# Patient Record
Sex: Female | Born: 1960 | Race: White | Hispanic: No | Marital: Married | Smoking: Former smoker
Health system: Southern US, Community
[De-identification: ages and names within clinical notes are randomized; demographics above are authoritative.]

## PROBLEM LIST (undated history)

## (undated) DIAGNOSIS — M8580 Other specified disorders of bone density and structure, unspecified site: Secondary | ICD-10-CM

## (undated) DIAGNOSIS — F329 Major depressive disorder, single episode, unspecified: Secondary | ICD-10-CM

## (undated) DIAGNOSIS — G4730 Sleep apnea, unspecified: Secondary | ICD-10-CM

## (undated) DIAGNOSIS — N393 Stress incontinence (female) (male): Secondary | ICD-10-CM

## (undated) DIAGNOSIS — K219 Gastro-esophageal reflux disease without esophagitis: Secondary | ICD-10-CM

## (undated) DIAGNOSIS — E785 Hyperlipidemia, unspecified: Secondary | ICD-10-CM

## (undated) DIAGNOSIS — E079 Disorder of thyroid, unspecified: Secondary | ICD-10-CM

## (undated) DIAGNOSIS — F32A Depression, unspecified: Secondary | ICD-10-CM

## (undated) DIAGNOSIS — M810 Age-related osteoporosis without current pathological fracture: Secondary | ICD-10-CM

## (undated) HISTORY — PX: LAPAROSCOPIC ENDOMETRIOSIS FULGURATION: SUR769

## (undated) HISTORY — DX: Disorder of thyroid, unspecified: E07.9

## (undated) HISTORY — DX: Major depressive disorder, single episode, unspecified: F32.9

## (undated) HISTORY — PX: LAPAROTOMY: SHX154

## (undated) HISTORY — PX: OTHER SURGICAL HISTORY: SHX169

## (undated) HISTORY — DX: Hyperlipidemia, unspecified: E78.5

## (undated) HISTORY — PX: ABDOMINAL HYSTERECTOMY: SHX81

## (undated) HISTORY — PX: THYROIDECTOMY: SHX17

## (undated) HISTORY — DX: Depression, unspecified: F32.A

## (undated) HISTORY — PX: THYROIDECTOMY, PARTIAL: SHX18

## (undated) HISTORY — DX: Gastro-esophageal reflux disease without esophagitis: K21.9

## (undated) HISTORY — DX: Sleep apnea, unspecified: G47.30

## (undated) HISTORY — PX: OOPHORECTOMY: SHX86

## (undated) HISTORY — DX: Other specified disorders of bone density and structure, unspecified site: M85.80

## (undated) HISTORY — DX: Age-related osteoporosis without current pathological fracture: M81.0

---

## 2004-09-27 ENCOUNTER — Other Ambulatory Visit: Admission: RE | Admit: 2004-09-27 | Discharge: 2004-09-27 | Payer: Self-pay | Admitting: Family Medicine

## 2005-01-17 ENCOUNTER — Encounter: Admission: RE | Admit: 2005-01-17 | Discharge: 2005-01-17 | Payer: Self-pay | Admitting: Family Medicine

## 2005-02-22 ENCOUNTER — Encounter (HOSPITAL_COMMUNITY): Admission: RE | Admit: 2005-02-22 | Discharge: 2005-05-23 | Payer: Self-pay | Admitting: Endocrinology

## 2005-04-10 ENCOUNTER — Ambulatory Visit (HOSPITAL_COMMUNITY): Admission: RE | Admit: 2005-04-10 | Discharge: 2005-04-10 | Payer: Self-pay | Admitting: Surgery

## 2005-12-09 ENCOUNTER — Ambulatory Visit (HOSPITAL_COMMUNITY): Admission: RE | Admit: 2005-12-09 | Discharge: 2005-12-10 | Payer: Self-pay | Admitting: Surgery

## 2005-12-09 ENCOUNTER — Encounter (INDEPENDENT_AMBULATORY_CARE_PROVIDER_SITE_OTHER): Payer: Self-pay | Admitting: *Deleted

## 2006-03-04 ENCOUNTER — Encounter: Admission: RE | Admit: 2006-03-04 | Discharge: 2006-03-04 | Payer: Self-pay | Admitting: Surgery

## 2006-04-08 ENCOUNTER — Encounter: Admission: RE | Admit: 2006-04-08 | Discharge: 2006-04-08 | Payer: Self-pay | Admitting: Surgery

## 2006-12-25 ENCOUNTER — Ambulatory Visit (HOSPITAL_COMMUNITY): Admission: RE | Admit: 2006-12-25 | Discharge: 2006-12-25 | Payer: Self-pay | Admitting: Surgery

## 2008-11-04 ENCOUNTER — Ambulatory Visit: Payer: Self-pay | Admitting: Family Medicine

## 2008-11-04 DIAGNOSIS — E213 Hyperparathyroidism, unspecified: Secondary | ICD-10-CM | POA: Insufficient documentation

## 2008-11-04 DIAGNOSIS — F341 Dysthymic disorder: Secondary | ICD-10-CM | POA: Insufficient documentation

## 2008-11-04 DIAGNOSIS — F32A Depression, unspecified: Secondary | ICD-10-CM | POA: Insufficient documentation

## 2008-11-04 DIAGNOSIS — F329 Major depressive disorder, single episode, unspecified: Secondary | ICD-10-CM

## 2008-11-07 ENCOUNTER — Encounter: Admission: RE | Admit: 2008-11-07 | Discharge: 2008-11-07 | Payer: Self-pay | Admitting: Family Medicine

## 2008-11-07 LAB — HM MAMMOGRAPHY: HM Mammogram: NEGATIVE

## 2008-11-10 ENCOUNTER — Telehealth (INDEPENDENT_AMBULATORY_CARE_PROVIDER_SITE_OTHER): Payer: Self-pay | Admitting: *Deleted

## 2008-11-10 LAB — CONVERTED CEMR LAB
ALT: 25 units/L (ref 0–35)
AST: 22 units/L (ref 0–37)
Albumin: 3.8 g/dL (ref 3.5–5.2)
Alkaline Phosphatase: 105 units/L (ref 39–117)
BUN: 11 mg/dL (ref 6–23)
Basophils Absolute: 0 10*3/uL (ref 0.0–0.1)
Basophils Relative: 0.1 % (ref 0.0–3.0)
Bilirubin, Direct: 0.1 mg/dL (ref 0.0–0.3)
CO2: 30 meq/L (ref 19–32)
Calcium: 10.4 mg/dL (ref 8.4–10.5)
Chloride: 108 meq/L (ref 96–112)
Cholesterol: 193 mg/dL (ref 0–200)
Creatinine, Ser: 0.8 mg/dL (ref 0.4–1.2)
Eosinophils Absolute: 0.2 10*3/uL (ref 0.0–0.7)
Eosinophils Relative: 5.6 % — ABNORMAL HIGH (ref 0.0–5.0)
GFR calc non Af Amer: 81.31 mL/min (ref >60)
Glucose, Bld: 78 mg/dL (ref 70–99)
HCT: 39 % (ref 36.0–46.0)
HDL: 41.7 mg/dL (ref >39.00)
Hemoglobin: 13.2 g/dL (ref 12.0–15.0)
LDL Cholesterol: 131 mg/dL — ABNORMAL HIGH (ref 0–99)
Lymphocytes Relative: 20 % (ref 12.0–46.0)
Lymphs Abs: 0.6 10*3/uL — ABNORMAL LOW (ref 0.7–4.0)
MCHC: 33.9 g/dL (ref 30.0–36.0)
MCV: 91.3 fL (ref 78.0–100.0)
Monocytes Absolute: 0.4 10*3/uL (ref 0.1–1.0)
Monocytes Relative: 12.5 % — ABNORMAL HIGH (ref 3.0–12.0)
Neutro Abs: 1.9 10*3/uL (ref 1.4–7.7)
Neutrophils Relative %: 61.8 % (ref 43.0–77.0)
Platelets: 249 10*3/uL (ref 150.0–400.0)
Potassium: 4.9 meq/L (ref 3.5–5.1)
RBC: 4.27 M/uL (ref 3.87–5.11)
RDW: 12 % (ref 11.5–14.6)
Sodium: 145 meq/L (ref 135–145)
TSH: 6.23 microintl units/mL — ABNORMAL HIGH (ref 0.35–5.50)
Total Bilirubin: 0.9 mg/dL (ref 0.3–1.2)
Total CHOL/HDL Ratio: 5
Total Protein: 6.9 g/dL (ref 6.0–8.3)
Triglycerides: 103 mg/dL (ref 0.0–149.0)
VLDL: 20.6 mg/dL (ref 0.0–40.0)
WBC: 3.1 10*3/uL — ABNORMAL LOW (ref 4.5–10.5)

## 2008-11-22 ENCOUNTER — Ambulatory Visit: Payer: Self-pay | Admitting: Family Medicine

## 2008-11-22 DIAGNOSIS — E785 Hyperlipidemia, unspecified: Secondary | ICD-10-CM | POA: Insufficient documentation

## 2008-11-22 DIAGNOSIS — L989 Disorder of the skin and subcutaneous tissue, unspecified: Secondary | ICD-10-CM | POA: Insufficient documentation

## 2008-11-22 DIAGNOSIS — E039 Hypothyroidism, unspecified: Secondary | ICD-10-CM | POA: Insufficient documentation

## 2008-12-23 ENCOUNTER — Encounter (INDEPENDENT_AMBULATORY_CARE_PROVIDER_SITE_OTHER): Payer: Self-pay | Admitting: *Deleted

## 2008-12-23 ENCOUNTER — Ambulatory Visit: Payer: Self-pay | Admitting: Family Medicine

## 2008-12-23 LAB — CONVERTED CEMR LAB: TSH: 2.22 microintl units/mL (ref 0.35–5.50)

## 2009-04-27 ENCOUNTER — Ambulatory Visit: Payer: Self-pay | Admitting: Family Medicine

## 2009-04-27 DIAGNOSIS — J019 Acute sinusitis, unspecified: Secondary | ICD-10-CM | POA: Insufficient documentation

## 2009-06-12 ENCOUNTER — Telehealth (INDEPENDENT_AMBULATORY_CARE_PROVIDER_SITE_OTHER): Payer: Self-pay | Admitting: *Deleted

## 2009-06-14 ENCOUNTER — Telehealth (INDEPENDENT_AMBULATORY_CARE_PROVIDER_SITE_OTHER): Payer: Self-pay | Admitting: *Deleted

## 2009-06-15 ENCOUNTER — Ambulatory Visit: Payer: Self-pay | Admitting: Family Medicine

## 2009-06-19 ENCOUNTER — Telehealth (INDEPENDENT_AMBULATORY_CARE_PROVIDER_SITE_OTHER): Payer: Self-pay | Admitting: *Deleted

## 2009-06-21 LAB — CONVERTED CEMR LAB
Cholesterol: 188 mg/dL (ref 0–200)
HDL: 49.5 mg/dL (ref >39.00)
LDL Cholesterol: 108 mg/dL — ABNORMAL HIGH (ref 0–99)
Total CHOL/HDL Ratio: 4
Triglycerides: 153 mg/dL — ABNORMAL HIGH (ref 0.0–149.0)
VLDL: 30.6 mg/dL (ref 0.0–40.0)

## 2009-06-22 ENCOUNTER — Encounter (INDEPENDENT_AMBULATORY_CARE_PROVIDER_SITE_OTHER): Payer: Self-pay | Admitting: *Deleted

## 2009-07-14 ENCOUNTER — Ambulatory Visit: Payer: Self-pay | Admitting: Family

## 2009-07-14 DIAGNOSIS — H6690 Otitis media, unspecified, unspecified ear: Secondary | ICD-10-CM | POA: Insufficient documentation

## 2009-07-14 LAB — CONVERTED CEMR LAB
Inflenza A Ag: NEGATIVE
Influenza B Ag: NEGATIVE

## 2009-09-11 ENCOUNTER — Telehealth: Payer: Self-pay | Admitting: Family

## 2009-11-02 ENCOUNTER — Telehealth (INDEPENDENT_AMBULATORY_CARE_PROVIDER_SITE_OTHER): Payer: Self-pay | Admitting: *Deleted

## 2009-11-03 ENCOUNTER — Telehealth (INDEPENDENT_AMBULATORY_CARE_PROVIDER_SITE_OTHER): Payer: Self-pay | Admitting: *Deleted

## 2010-03-30 ENCOUNTER — Ambulatory Visit: Payer: Self-pay | Admitting: Family

## 2010-03-30 DIAGNOSIS — S335XXA Sprain of ligaments of lumbar spine, initial encounter: Secondary | ICD-10-CM | POA: Insufficient documentation

## 2010-03-30 DIAGNOSIS — G2581 Restless legs syndrome: Secondary | ICD-10-CM | POA: Insufficient documentation

## 2010-03-30 LAB — CONVERTED CEMR LAB
Bilirubin Urine: NEGATIVE
Blood in Urine, dipstick: NEGATIVE
Glucose, Urine, Semiquant: NEGATIVE
Ketones, urine, test strip: NEGATIVE
Nitrite: NEGATIVE
Protein, U semiquant: NEGATIVE
Specific Gravity, Urine: 1.01
Urobilinogen, UA: 0.2
WBC Urine, dipstick: NEGATIVE
pH: 5

## 2010-04-03 ENCOUNTER — Telehealth (INDEPENDENT_AMBULATORY_CARE_PROVIDER_SITE_OTHER): Payer: Self-pay | Admitting: *Deleted

## 2010-04-25 ENCOUNTER — Telehealth (INDEPENDENT_AMBULATORY_CARE_PROVIDER_SITE_OTHER): Payer: Self-pay | Admitting: *Deleted

## 2010-05-04 ENCOUNTER — Other Ambulatory Visit: Payer: Self-pay | Admitting: Family Medicine

## 2010-05-04 ENCOUNTER — Encounter: Payer: Self-pay | Admitting: Family Medicine

## 2010-05-04 ENCOUNTER — Ambulatory Visit
Admission: RE | Admit: 2010-05-04 | Discharge: 2010-05-04 | Payer: Self-pay | Source: Home / Self Care | Attending: Family Medicine | Admitting: Family Medicine

## 2010-05-04 DIAGNOSIS — K219 Gastro-esophageal reflux disease without esophagitis: Secondary | ICD-10-CM | POA: Insufficient documentation

## 2010-05-04 DIAGNOSIS — M899 Disorder of bone, unspecified: Secondary | ICD-10-CM | POA: Insufficient documentation

## 2010-05-04 DIAGNOSIS — N951 Menopausal and female climacteric states: Secondary | ICD-10-CM | POA: Insufficient documentation

## 2010-05-04 DIAGNOSIS — M949 Disorder of cartilage, unspecified: Secondary | ICD-10-CM

## 2010-05-04 LAB — LIPID PANEL
Cholesterol: 248 mg/dL — ABNORMAL HIGH (ref 0–200)
HDL: 52.6 mg/dL (ref >39.00)
Total CHOL/HDL Ratio: 5
Triglycerides: 142 mg/dL (ref 0.0–149.0)
VLDL: 28.4 mg/dL (ref 0.0–40.0)

## 2010-05-04 LAB — HEPATIC FUNCTION PANEL
ALT: 32 U/L (ref 0–35)
AST: 20 U/L (ref 0–37)
Albumin: 3.9 g/dL (ref 3.5–5.2)
Alkaline Phosphatase: 116 U/L (ref 39–117)
Bilirubin, Direct: 0 mg/dL (ref 0.0–0.3)
Total Bilirubin: 0.7 mg/dL (ref 0.3–1.2)
Total Protein: 6.8 g/dL (ref 6.0–8.3)

## 2010-05-04 LAB — CBC WITH DIFFERENTIAL/PLATELET
Basophils Absolute: 0 10*3/uL (ref 0.0–0.1)
Basophils Relative: 1 % (ref 0.0–3.0)
Eosinophils Absolute: 0.2 10*3/uL (ref 0.0–0.7)
Eosinophils Relative: 3.4 % (ref 0.0–5.0)
HCT: 41.1 % (ref 36.0–46.0)
Hemoglobin: 14.3 g/dL (ref 12.0–15.0)
Lymphocytes Relative: 18.6 % (ref 12.0–46.0)
Lymphs Abs: 0.9 10*3/uL (ref 0.7–4.0)
MCHC: 34.8 g/dL (ref 30.0–36.0)
MCV: 92.7 fl (ref 78.0–100.0)
Monocytes Absolute: 0.4 10*3/uL (ref 0.1–1.0)
Monocytes Relative: 8.1 % (ref 3.0–12.0)
Neutro Abs: 3.3 10*3/uL (ref 1.4–7.7)
Neutrophils Relative %: 68.9 % (ref 43.0–77.0)
Platelets: 276 10*3/uL (ref 150.0–400.0)
RBC: 4.44 Mil/uL (ref 3.87–5.11)
RDW: 12.6 % (ref 11.5–14.6)
WBC: 4.9 10*3/uL (ref 4.5–10.5)

## 2010-05-04 LAB — BASIC METABOLIC PANEL
BUN: 16 mg/dL (ref 6–23)
CO2: 27 mEq/L (ref 19–32)
Calcium: 10.5 mg/dL (ref 8.4–10.5)
Chloride: 106 mEq/L (ref 96–112)
Creatinine, Ser: 0.8 mg/dL (ref 0.4–1.2)
GFR: 85.73 mL/min (ref >60.00)
Glucose, Bld: 83 mg/dL (ref 70–99)
Potassium: 4.8 mEq/L (ref 3.5–5.1)
Sodium: 142 mEq/L (ref 135–145)

## 2010-05-04 LAB — H. PYLORI ANTIBODY, IGG: H Pylori IgG: NEGATIVE

## 2010-05-04 LAB — LDL CHOLESTEROL, DIRECT: Direct LDL: 178.1 mg/dL

## 2010-05-04 LAB — TSH: TSH: 1.47 u[IU]/mL (ref 0.35–5.50)

## 2010-05-06 LAB — CONVERTED CEMR LAB: Vit D, 25-Hydroxy: 43 ng/mL (ref 30–89)

## 2010-05-08 ENCOUNTER — Telehealth (INDEPENDENT_AMBULATORY_CARE_PROVIDER_SITE_OTHER): Payer: Self-pay | Admitting: *Deleted

## 2010-05-29 NOTE — Progress Notes (Signed)
Summary: needs 90 day of generic synthroid and samples or 2 wk local   Phone Note Refill Request Message from:  Patient on April 03, 2010 4:08 PM  Refills Requested: Medication #1:  SYNTHROID 100 MCG TABS take one tablet daily [BMN] patient now says it is OK to use Generic--please send prescription to [facility];    she is out of meds---do we have samples for her to pick up??   if not, please call in 2 week prescription for Generic synthroid to [facility] on [address]    Next Appointment Scheduled: none Initial call taken by: Jerolyn Shin,  April 03, 2010 4:11 PM  Follow-up for Phone Call        spoke w/ patient aware we have samples of medication and also will send prescription to [facility]........Marland KitchenDoristine Devoid CMA  April 04, 2010 3:09 PM     Prescriptions: SYNTHROID 100 MCG TABS (LEVOTHYROXINE SODIUM) take one tablet daily Brand medically necessary #90 x 0   Entered by:   Doristine Devoid CMA   Authorized by:   Neena Rhymes MD   Signed by:   Doristine Devoid CMA on 04/04/2010   Method used:   Faxed to ...       [facility] (mail-order)             , [place]         Ph: [phone]       Fax: [phone]   RxID:   [id number]

## 2010-05-29 NOTE — Assessment & Plan Note (Signed)
Summary: cpx/cbs   Vital Signs:  Patient profile:   50 year old female Height:      62.50 inches Weight:      201.3 pounds Pulse rate:   64 / minute BP sitting:   108 / 60  (right arm)  Vitals Entered By: Doristine Devoid (November 22, 2008 8:39 AM) CC: cpx no pap   History of Present Illness: 50 yo woman here today for CPE.  labs drawn last time indicated hypothyroid- started on synthroid.  LDL 131- refused simvastatin.  wants to focus on diet and exercise and recheck in 6 months.  depression- feels more 'positive', doing well on Citalopram.  sleeping better.  nasal allergy sxs- reports difficulty breathing, post nasal drip, nasal congestion  Preventive Screening-Counseling & Management  Alcohol-Tobacco     Alcohol drinks/day: 2     Smoking Status: current     Smoking Cessation Counseling: yes     Smoke Cessation Stage: contemplative     Packs/Day: <0.25  Caffeine-Diet-Exercise     Does Patient Exercise: no      Sexual History:  currently monogamous.        Drug Use:  never.    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  The patient denies anorexia, fever, weight loss, weight gain, vision loss, decreased hearing, chest pain, syncope, dyspnea on exertion, peripheral edema, prolonged cough, headaches, melena, hematochezia, hematuria, abnormal bleeding, and breast masses.         hoarseness has worsened over last 2 weeks- pt feels this is allergy related, not taking pills currently w/ 'bug'- diarrhea, abd pain, bloated GERD well controlled but does flare multiple new freckles  Physical Exam  General:  Well-developed,well-nourished,in no acute distress; alert,appropriate and cooperative throughout examination Head:  Normocephalic and atraumatic without obvious abnormalities. No apparent alopecia or balding. Eyes:  No corneal or conjunctival inflammation noted. EOMI. Perrla. Funduscopic exam benign, without hemorrhages, exudates or papilledema. Vision grossly normal. Ears:  External ear exam shows no significant lesions or deformities.  Otoscopic examination reveals clear canals, tympanic membranes are intact bilaterally without bulging, retraction, inflammation or discharge. Hearing is grossly normal bilaterally. Nose:  markedly edematous turbinates Mouth:  + post nasal drip Neck:  scar visible, some diffuse thyroid fullness Breasts:  No mass, nodules, thickening, tenderness, bulging, retraction, inflamation, nipple discharge or skin changes noted.   Lungs:  Normal respiratory effort, chest expands symmetrically. Lungs are clear to auscultation, no crackles or wheezes. Heart:  Normal rate and regular rhythm. S1 and S2 normal without gallop, murmur, click, rub or other extra sounds. Abdomen:  Bowel sounds positive,abdomen soft and non-tender without masses, organomegaly or hernias noted. Msk:  No deformity or scoliosis noted of thoracic  or lumbar spine.   Pulses:  +2 carotid, radial, DP Extremities:  No  clubbing, cyanosis, edema, or deformity noted with normal full range of motion of all joints.   Neurologic:  No cranial nerve deficits noted. Station and gait are normal. Plantar reflexes are down-going bilaterally. DTRs are symmetrical throughout. Sensory, motor and coordinative functions appear intact. Skin:  Intact without suspicious lesions or rashes, no concerning moles/freckles but pt would like routine screen by derm Cervical Nodes:  No lymphadenopathy noted Axillary Nodes:  No palpable lymphadenopathy Psych:  Cognition and judgment appear intact. Alert and cooperative with normal attention span and concentration. No apparent delusions, illusions, hallucinations   Impression & Recommendations:  Problem # 1:  PHYSICAL EXAMINATION (ICD-V70.0) Assessment Unchanged PE WNL.  labs checked at last visit and discussed.  encouraged healthy diet and regular exercise.  anticipatory guidance provided.  Problem # 2:  ANXIETY DEPRESSION (ICD-300.4) Assessment: Improved pt doing well on SSRI.  feels sxs are much better controlled.  will continue to follow.  Problem # 3:  HYPOTHYROIDISM (ICD-244.9) Assessment: New pt's labs indicated low thyroid fxn.  start synthroid and recheck. Her updated medication list for this problem includes:    Synthroid 100 Mcg Tabs (Levothyroxine sodium) .Marland Kitchen... Take one tablet daily  Problem # 4:  HYPERLIPIDEMIA (ICD-272.4) Assessment: New pt's LDL and total cholesterol elevated but pt prefers to focus on diet and exercise rather than start statin.  will follow.  Problem # 5:  SKIN LESIONS, MULTIPLE (ICD-709.9) Assessment: New lesions look benign but will refer to derm at pt's request. Orders: Dermatology Referral (Derma)  Complete Medication List: 1)  Citalopram Hydrobromide 20 Mg Tabs (Citalopram hydrobromide) .... Take one tablet by mouth daily 2)  Lunesta 2 Mg Tabs (Eszopiclone) .Marland Kitchen.. 1 tab prior to bed as needed for sleep. 3)  Synthroid 100 Mcg Tabs  (Levothyroxine sodium) .... Take one tablet daily 4)  Nasonex 50 Mcg/act Susp (Mometasone furoate) .... 2 sprays each nostril once daily  Patient Instructions: 1)  Schedule a lab visit in 4 weeks to check TSH 2)  Keep up the good work on diet and exercise- we'll recheck your cholesterol in 6 months- you can schedule a fasting lab visit 3)  Use the nasal spray- 2 sprays each nostril to decrease congestion, post-nasal drip, and cough 4)  Please call with any questions or concerns 5)  Have a great summer and good luck starting your new job! Prescriptions: NASONEX 50 MCG/ACT SUSP (MOMETASONE FUROATE) 2 sprays each nostril once daily  #1 x 3   Entered and Authorized by:   Neena Rhymes MD   Signed by:   Neena Rhymes MD on 11/22/2008   Method used:   Electronically to        [facility] [address] (retail)       [address]       [place], [place]  [address]       Ph: [phone] or [phone]       Fax: [phone]   RxID:   [id number]

## 2010-05-29 NOTE — Progress Notes (Signed)
Summary: discuss labs  Phone Note Outgoing Call   Call placed by: Doristine Devoid,  November 10, 2008 11:37 AM Call placed to: Patient Summary of Call: pt w/ elevated LDL, goal is <100.  needs to start Simvastatin 20mg  at bedtime and recheck LFTs in 6 weeks.  TSH also elevated meaning thyroid is underfunctioning.  start Synthroid daily and recheck TSH in 6 weeks.  mildly low WBC- likely a lab variation and nothing to worry about.  will recheck when pt returns for above labs in 6 weeks.   Follow-up for Phone Call        spoke with patient aware of labs refused cholesterol meds also that synthroid doesn't do any for her and that she was on previously informed patient that since we didn't have any records that this was going to be starting point spoke with Dr. Beverely Low ok'd to start patient will schedule appt in 6 weeks to recheck..............Marland KitchenDoristine Devoid  November 10, 2008 11:40 AM     New/Updated Medications: SYNTHROID 100 MCG TABS (LEVOTHYROXINE SODIUM) take one tablet daily Prescriptions: SYNTHROID 100 MCG TABS (LEVOTHYROXINE SODIUM) take one tablet daily  #30 x 3   Entered by:   Doristine Devoid   Authorized by:   Neena Rhymes MD   Signed by:   Doristine Devoid on 11/10/2008   Method used:   Electronically to        [facility] [address] (retail)       [address]       [place], [place]  [address]       Ph: [phone] or [phone]       Fax: [phone]   RxID:   [id number]

## 2010-05-29 NOTE — Progress Notes (Signed)
Summary: SYNTHROID REFILL   Phone Note Refill Request Message from:  Fax from Pharmacy on November 02, 2009 3:04 PM  Refills Requested: Medication #1:  SYNTHROID 100 MCG TABS take one tablet daily [BMN] Initial call taken by: Doristine Devoid,  November 02, 2009 3:04 PM    Prescriptions: SYNTHROID 100 MCG TABS (LEVOTHYROXINE SODIUM) take one tablet daily Brand medically necessary #90 x 0   Entered by:   Doristine Devoid   Authorized by:   Neena Rhymes MD   Signed by:   Doristine Devoid on 11/02/2009   Method used:   Faxed to ...       [facility] (mail-order)             , [place]         Ph: [phone]       Fax: [phone]   RxID:   [id number]

## 2010-05-29 NOTE — Progress Notes (Signed)
Summary: Lunesta a non-preferred med  Phone Note Refill Request Message from:  Fax from Pharmacy on June 14, 2009 3:13 PM  Refills Requested: Medication #1:  LUNESTA 2 MG TABS 1 tab prior to bed as needed for sleep. PRIOR AUTH [phone]  Initial call taken by: Barb Merino,  June 14, 2009 3:14 PM  Follow-up for Phone Call        Alfonso Patten a non preferred med, preferred meds are; Zolpidem, Temazepam, Estazolam, Flurazepam, or Zaleplon.  Pt has not tried any of these meds--Please advise .Kandice Hams  June 15, 2009 12:01 PM  Follow-up by: Kandice Hams,  June 15, 2009 12:01 PM  Additional Follow-up for Phone Call Additional follow up Details #1::        if patient is willing to try Ambien 10 mg one p.o. nightly, call 30 and 1 RF Jose E. Paz MD  June 16, 2009 10:11 AM     Additional Follow-up for Phone Call Additional follow up Details #2::    rx changed and faxed pt aware .Kandice Hams  June 16, 2009 11:15 AM  Follow-up by: Kandice Hams,  June 16, 2009 11:15 AM  New/Updated Medications: ZOLPIDEM TARTRATE 10 MG TABS (ZOLPIDEM TARTRATE) 1 by mouth at bedtime Prescriptions: ZOLPIDEM TARTRATE 10 MG TABS (ZOLPIDEM TARTRATE) 1 by mouth at bedtime  #30 x 1   Entered by:   Kandice Hams   Authorized by:   Nolon Rod. Paz MD   Signed by:   Kandice Hams on 06/16/2009   Method used:   Printed then faxed to ...       [facility] [address] (retail)       [address]       [place], [place]  [address]       Ph: [phone] or [phone]       Fax: [phone]   RxID:   [id number]

## 2010-05-29 NOTE — Assessment & Plan Note (Signed)
Summary: Sinusitis, thyroid   Vital Signs:  Patient profile:   50 year old female Weight:      193 pounds O2 Sat:      93 % on Room air Temp:     97.8 degrees F oral Pulse rate:   74 / minute BP sitting:   106 / 67  (left arm) Cuff size:   large  Vitals Entered By: Alfred Levins, CMA (April 27, 2009 1:58 PM)  O2 Flow:  Room air CC: sinus pressure, drainage, fatigue, laryngitis, no enery, sob   CC:  sinus pressure, drainage, fatigue, laryngitis, no enery, and sob.  History of Present Illness: sinus pressure, drainage, fatigue, laryngitis, no energy, sob. Sxs for 4-6 weeks.  Went 2 months ago and given ABX and given a zpack.  Didn't really help. Feels exhausted. Chills but not sure if true fever.  sinus pressure is bilat and behind the eyes. Occ ear pressure. Occ chest pain and Occ SOB with going up and down steps. Taking Aleve, mucninex, and cough medicaiton.  Last week has been getting worse.  No hx of athma or heart problems. +HAs.  Hx hyperparathyroid.    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  Physical Exam  General:  Well-developed,well-nourished,in no acute distress; alert,appropriate and cooperative throughout examination Head:  Normocephalic and atraumatic without obvious abnormalities. No apparent alopecia or balding. Eyes:  No corneal or conjunctival inflammation noted. EOMI. Perrla.  Ears:  External ear exam shows no significant lesions or deformities.  Otoscopic examination reveals clear canals, tympanic membranes are intact bilaterally without bulging, retraction, inflammation or discharge.  Hearing is grossly normal bilaterally. Nose:  External nasal examination shows no deformity or inflammation. Nasal mucosa are pink and moist without lesions or exudates. Mouth:  Oral mucosa and oropharynx without lesions or exudates.  Teeth in good repair. Neck:  No deformities, masses, or tenderness noted. Lungs:  Rhonchi on the LLL but cleared after cough. Good air movement. no crackles and no wheezes.   Heart:  Normal rate and regular rhythm. S1 and S2 normal without gallop, murmur, click, rub or other extra sounds. Pulses:  Radial 2+  Neurologic:  alert & oriented X3.   Skin:  no rashes.  Well healed scar at the base of her neck.  Cervical Nodes:  No lymphadenopathy noted Psych:  Cognition and judgment appear intact. Alert and cooperative with normal attention span and concentration. No apparent delusions, illusions, hallucinations   Impression & Recommendations:  Problem # 1:  SINUSITIS - ACUTE-NOS (ICD-461.9) Sxs for 6 weeks and failed a zpack so will treat with more broad spectrum augmenint. If not improving after 10 days please call the office or if feel we need to extend the ABX to 21 days please call the office.  Continue nasaonex. She is a smoker.  Her updated medication list for this problem includes:    Nasonex 50 Mcg/act Susp (Mometasone furoate) .Marland Kitchen... 2 sprays each nostril once daily    Augmentin 875-125 Mg Tabs (Amoxicillin-pot clavulanate) .Marland Kitchen... Take 1 tablet by mouth two times a day for 14 tabs  Problem # 2:  HYPOTHYROIDISM (ICD-244.9) She plans to fu with [facility]oc[facility] at [facility] in January but let hr know if can't get  a reasonably timed appt we can at least recheck her TSH adn make sure her fatigue is not coming from her thyroid gland.   Her updated medication list for this problem includes:    Synthroid 100 Mcg Tabs (Levothyroxine sodium) .Marland Kitchen... Take one tablet daily  Complete Medication List: 1)  Citalopram Hydrobromide 20 Mg Tabs (Citalopram hydrobromide) .... Take one tablet  by mouth daily 2)  Lunesta 2 Mg Tabs (Eszopiclone) .Marland Kitchen.. 1 tab prior to bed as needed for sleep. 3)  Synthroid 100 Mcg Tabs (Levothyroxine sodium) .... Take one tablet daily 4)  Nasonex 50 Mcg/act Susp (Mometasone furoate) .... 2 sprays each nostril once daily 5)  Augmentin 875-125 Mg Tabs (Amoxicillin-pot clavulanate) .... Take 1 tablet by mouth two times a day for 14 tabs Prescriptions: AUGMENTIN 875-125 MG TABS (AMOXICILLIN-POT CLAVULANATE) Take 1 tablet by mouth two times a day for 14 tabs  #28 x 0   Entered and Authorized by:   Nani Gasser MD   Signed by:   Nani Gasser MD on 04/27/2009   Method used:   Electronically to        [facility] [address] (retail)       [address]       [place], [place]  [address]       Ph: [phone] or [phone]       Fax: [phone]   RxID:   [id number]

## 2010-05-29 NOTE — Progress Notes (Signed)
Summary: Citalopram request  Phone Note Refill Request Call back at [phone] Message from:  [facility] on Sep 11, 2009 10:48 AM  Refills Requested: Medication #1:  CELEXA 40 MG TABS one tablet by mouth daily   Supply Requested: 1 month   Last Refilled: 07/14/2009 How many refills can pt. have and when should pt. follow up?   Method Requested: Electronic Next Appointment Scheduled: none Initial call taken by: Mervin Kung CMA,  Sep 11, 2009 10:50 AM  Follow-up for Phone Call        she can have 2 refills, then needs full physical with Dr. Beverely Low in July.   Follow-up by: Lemont Fillers FNP,  Sep 11, 2009 11:20 AM  Additional Follow-up for Phone Call Additional follow up Details #1::        Spoke to pt and advised her to contact Dr. Rennis Golden office and schedule a physical for July. She is aware that she has enough refills until then.  Pt. voices understanding.  Nicki Guadalajara Fergerson CMA  Sep 11, 2009 1:12 PM     Prescriptions: CELEXA 40 MG TABS (CITALOPRAM HYDROBROMIDE) one tablet by mouth daily  #30 x 2   Entered by:   Mervin Kung CMA   Authorized by:   Lemont Fillers FNP   Signed by:   Mervin Kung CMA on 09/11/2009   Method used:   Electronically to        [facility] [address] (retail)       [address]       [place], [place]  [address]       Ph: [phone] or [phone]       Fax: [phone]   RxID:   [id number]

## 2010-05-29 NOTE — Progress Notes (Signed)
Summary: Refill Request Lunesta  Phone Note Refill Request Message from:  Pharmacy on [facility] [address] Fax # [phone]  Refills Requested: Medication #1:  LUNESTA 2 MG TABS 1 tab prior to bed as needed for sleep.   Dosage confirmed as above?Dosage Confirmed last ov 04/27/09, last refill #30 x 1 on 11/04/08  Initial call taken by: Harold Barban,  June 12, 2009 11:30 AM  Follow-up for Phone Call        ok 30 and 1 RF Follow-up by: [facility] E. Paz MD,  June 14, 2009 12:02 PM    Prescriptions: LUNESTA 2 MG TABS (ESZOPICLONE) 1 tab prior to bed as needed for sleep.  #30 x 1   Entered by:   Kandice Hams   Authorized by:   Nolon Rod. Paz MD   Signed by:   Kandice Hams on 06/14/2009   Method used:   Printed then faxed to ...       [facility] [address] (retail)       [address]       [place], [place]  [address]       Ph: [phone] or [phone]       Fax: [phone]   RxID:   [id number]

## 2010-05-29 NOTE — Assessment & Plan Note (Signed)
Summary: new to estab.cbs   Vital Signs:  Patient profile:   50 year old female Height:      62.50 inches Weight:      203.8 pounds BMI:     36.81 Pulse rate:   64 / minute BP sitting:   120 / 76  (left arm)  Vitals Entered By: Doristine Devoid (November 04, 2008 8:13 AM) CC: new est- needs primary care getting back on Lunesta    History of Present Illness: 50 yo woman here today to est care.  last PCP visit- 4 yrs ago.  last MD visit 18 months ago.  1) Hyperparathyroid- has had surgery and seen 3 specialists and 'problem still isn't fixed'.  Dr Gerrit Friends was Careers adviser.  Balan was endo.  Saw specialist at [facility] and subsequently [facility].  had 1/2 of thyroid removed.  2) Depression- was on Welbutrin 300mg  for 'better part of last 10 yrs' but hasn't been on anything recently.  reports she gets 'very anxious'.  mother was bipolar alcoholic.  pt denies any episodes of mania or wild behavior.  pt was in therapy for 7 years after pt had 'breakdown' 15 yrs ago.  was seeing Dr Waverly Ferrari (psych).  pt previously on Lunesta- 'i just need to sleep'.  'i can't turn my brain off'  Health Maintainence- has not had mammogram or physical in 4 years.  Preventive Screening-Counseling & Management  Alcohol-Tobacco     Alcohol drinks/day: 3     Alcohol type: wine     Smoking Status: current     Packs/Day: <0.25  Caffeine-Diet-Exercise     Does Patient Exercise: no      Sexual History:  currently monogamous.        Drug Use:  never.    Allergies (verified): No Known Drug Allergies  Past History:  Past Medical History: Depression Hyperparathyroid   Past Surgical History: 1/2 thyroid removed Endometriosis Hysterectomy Oophorectomy  Family History: CAD-mother deceased 26 MI HTN-father,mother DM-no STROKE-paternal grandfather COLON CA-no BREAST CA-no  Social History: lives w/ husband, daughter, son (1999) older daughter (1985) working at [employer] in [place]  Status:  current Packs/Day:  <0.25 Does Patient Exercise:  no Sexual History:  currently monogamous Drug Use:  never  Review of Systems General:  Complains of fatigue and sleep disorder; denies chills, fever, loss of appetite, sweats, and weight loss. Eyes:  Denies blurring and double vision. CV:  Denies chest pain or discomfort, palpitations, shortness of breath with exertion, swelling of feet, and swelling of hands. GI:  Denies abdominal pain, nausea, and vomiting. Psych:  Complains of anxiety, depression, and irritability; denies suicidal thoughts/plans and thoughts of violence.  Physical Exam  General:  Well-developed,well-nourished,in no acute distress; alert,appropriate and cooperative throughout examination Head:  Normocephalic and atraumatic without obvious abnormalities. No apparent alopecia or balding. Eyes:  PERRL, EOMI Neck:  scar visible, some diffuse thyroid fullness Lungs:  Normal respiratory effort, chest expands symmetrically. Lungs are clear to auscultation, no crackles or wheezes. Heart:  Normal rate and regular rhythm. S1 and S2 normal without gallop, murmur, click, rub or other extra sounds. Pulses:  +2 carotid, radial, DP Extremities:  no C/C/E Psych:  Oriented X3, memory intact for recent and remote, normally interactive, good eye contact, and moderately anxious.     Impression & Recommendations:  Problem # 1:  ANXIETY DEPRESSION (ICD-300.4) Assessment New start SSRI.  # given to re-establish counseling.  Lunesta samples and script given w/ the understanding that as the  depression improves that the sleep issue should also improve.  Pt expresses understanding and is in agreement w/ this plan.  Problem # 2:  HYPERPARATHYROIDISM UNSPECIFIED (ICD-252.00) Assessment: New pt w/ apparent accessory parathyroid gland in chest, was seeing specialist at [facility].  will need to re-refer for evaluation and tx.  given partial thyroidectomy pt may need thyroid replacement.   check labs and start meds as needed.  Pt expresses understanding and is in agreement w/ this plan.  Problem # 3:  PHYSICAL EXAMINATION (ICD-V70.0) Assessment: New check labs and refer for mammo in preparation of upcoming CPE. Orders: Venipuncture ([address]) TLB-Lipid Panel (80061-LIPID) TLB-BMP (Basic Metabolic Panel-BMET) (80048-METABOL) TLB-CBC Platelet - w/Differential (85025-CBCD) TLB-Hepatic/Liver Function Pnl (80076-HEPATIC) TLB-TSH (Thyroid Stimulating Hormone) (43329-JJO) Radiology Referral (Radiology)  Complete Medication List: 1)  Citalopram Hydrobromide 20 Mg Tabs (Citalopram hydrobromide) .... Take one tablet by mouth daily 2)  Lunesta 2 Mg Tabs (Eszopiclone) .Marland Kitchen.. 1 tab prior to bed as needed for sleep.  Patient Instructions: 1)  Please schedule a follow-up appointment in 3-4 weeks for complete physical. 2)  Someone will call you with your mammogram appt 3)  We'll notify you of your lab results 4)  Call [facility] at [phone] to establish counseling  5)  Start the Citalopram daily 6)  Use the Lunesta as needed - no more than 3x/week 7)  Please bring the name of your specialist at [facility] so we can refer you back 8)  Call with any questions or concerns 9)  Hang in there! Prescriptions: LUNESTA 2 MG TABS (ESZOPICLONE) 1 tab prior to bed as needed for sleep.  #30 x 1   Entered and Authorized by:   Neena Rhymes MD   Signed by:   Neena Rhymes MD on 11/04/2008   Method used:   Print then Give to Patient   RxID:   [id number] CITALOPRAM HYDROBROMIDE 20 MG TABS (CITALOPRAM HYDROBROMIDE) take one tablet by mouth daily  #30 x 3   Entered and Authorized by:   Neena Rhymes MD   Signed by:   Neena Rhymes MD on 11/04/2008   Method used:   Print then Give to Patient   RxID:   [id number]

## 2010-05-29 NOTE — Letter (Signed)
Summary: Results Follow up Letter  [facility] [place]  [address] [place], [place] [address]   Phone: [phone]  Fax: [phone]    12/23/2008 MRN: [id number]  MOMOKO SLEZAK [address] [place], [place]  [address]  Dear Ms. Balderrama,  The following are the results of your recent test(s):  Test         Result    Pap Smear:        Normal _____  Not Normal _____ Comments: ______________________________________________________ Cholesterol: LDL(Bad cholesterol):         Your goal is less than:         HDL (Good cholesterol):       Your goal is more than: Comments:  ______________________________________________________ Mammogram:        Normal _____  Not Normal _____ Comments:  ___________________________________________________________________ Hemoccult:        Normal _____  Not normal _______ Comments:    _____________________________________________________________________ Other Tests: PLEASE SEE COPY OF LABS FROM 12/23/08    We routinely do not discuss normal results over the telephone.  If you desire a copy of the results, or you have any questions about this information we can discuss them at your next office visit.   Sincerely,

## 2010-05-29 NOTE — Assessment & Plan Note (Signed)
Summary: LOWER BACK PAIN/RH....--Rm 4   Vital Signs:  Patient profile:   50 year old female Height:      62.50 inches Weight:      198 pounds BMI:     35.77 Temp:     97.6 degrees F oral Pulse rate:   78 / minute Pulse rhythm:   regular Resp:     18 per minute BP sitting:   118 / 76  (right arm) Cuff size:   regular  Vitals Entered By: Mervin Kung CMA Duncan Dull) (March 30, 2010 3:43 PM) CC: Pt states she had sudden onset of constant lower back pain last night. Seems worse with movement. Is Patient Diabetic? No Pain Assessment Patient in pain? yes     Location: lower back Intensity: 7 Type: constant, heavy ache, sharp with movement Onset of pain  last night Comments Pt has completed augmentin and tessalon perles. Started Vit d 1000 International Units daily. Nicki Guadalajara Fergerson CMA Duncan Dull)  March 30, 2010 3:50 PM    Primary Care Provider:  Neena Rhymes MD  CC:  Pt states she had sudden onset of constant lower back pain last night. Seems worse with movement..  History of Present Illness: Allison Reyes is a 50 year old female who presents today with two concerns:  1) Low back Pain-  complains of low back pain.  Does not seem to be one side or the other. Denies fever. Non-radiating.  Pain is 7/10.  Denies dysuria or hematuria.  Urine was dark 4-5 days ago.  + hx hyperparathyroidism.    2) Leg movements at night=- Patient complains of RLS symptoms at night.  "They have become obnoxious. My husband wants me to do something about it."  Allergies (verified): No Known Drug Allergies  Past History:  Past Medical History: Last updated: 11/04/2008 Depression Hyperparathyroid   Past Surgical History: Last updated: 11/04/2008 1/2 thyroid removed Endometriosis Hysterectomy Oophorectomy  Review of Systems       see HPI  Physical Exam  General:  Well-developed,well-nourished,in no acute distress; alert,appropriate and cooperative throughout examination Head:   Normocephalic and atraumatic without obvious abnormalities. No apparent alopecia or balding. Neck:  No deformities, masses, or tenderness noted. Lungs:  Normal respiratory effort, chest expands symmetrically. Lungs are clear to auscultation, no crackles or wheezes. Heart:  Normal rate and regular rhythm. S1 and S2 normal without gallop, murmur, click, rub or other extra sounds. Abdomen:  Bowel sounds positive,abdomen soft and non-tender without masses, organomegaly or hernias noted. Msk:  Bilateral LE strength is 5/5 Neurologic:  2+ bilateral pateller/PT reflexes.   Detailed Back/Spine Exam  Lumbosacral Exam:  Inspection-deformity:    Normal Palpation-spinal tenderness:  Normal Lying Straight Leg Raise:    Right:  positive    Left:  positive     Increase pain with heel walking.  Increased pain in bilateral lower back R>L with straigt leg raise.     Impression & Recommendations:  Problem # 1:  LUMBAR STRAIN (ICD-847.2) Assessment New UA is negative. Will plan conservative treatment with mobic and as needed flexeril.   Problem # 2:  RESTLESS LEG SYNDROME (ICD-333.94) Assessment: New Trial of requip.  Pt instructed not to take with flexeril as it may increase sedative effects.  Complete Medication List: 1)  Celexa 40 Mg Tabs (Citalopram hydrobromide) .... One tablet by mouth daily 2)  Zolpidem Tartrate 10 Mg Tabs (Zolpidem tartrate) .Marland Kitchen.. 1 by mouth at bedtime 3)  Synthroid 100 Mcg Tabs (Levothyroxine sodium) .... Take  one tablet daily 4)  Nasonex 50 Mcg/act Susp (Mometasone furoate) .... 2 sprays each nostril once daily 5)  Augmentin 500-125 Mg Tabs (Amoxicillin-pot clavulanate) .... One tab by mouth two times a day x 10 days 6)  Tessalon Perles 100 Mg Caps (Benzonatate) .... One cap by mouth three times a day as needed cough 7)  Vitamin D 1000 Unit Tabs (Cholecalciferol) .... Take 1 tablet by mouth once a day. 8)  Mobic 7.5 Mg Tabs (Meloxicam) .... One tablet by mouth once daily  as needed for back pain 9)  Flexeril 5 Mg Tabs (Cyclobenzaprine hcl) .... One tablet by mouth three times a day as needed for back spasm 10)  Requip 1 Mg Tabs (Ropinirole hcl) .... One tablet by mouth at bedtime for restless legs  Other Orders: UA Dipstick w/o Micro (manual) ([address])  Patient Instructions: 1)  Most patients (90%) with low back pain will improve with time (2-6 weeks). Keep active but avoid activities that are painful. Apply moist heat and/or ice to lower back several times a day. 2)  Follow up with Dr. Beverely Low in 1 month. Prescriptions: REQUIP 1 MG TABS (ROPINIROLE HCL) one tablet by mouth at bedtime for restless legs  #30 x 0   Entered and Authorized by:   Lemont Fillers FNP   Signed by:   Lemont Fillers FNP on 03/30/2010   Method used:   Electronically to        [facility] (retail)       [address]       [place], [place]  [address]       Ph: [phone] or [phone]       Fax: [phone]   RxID:   [id number] FLEXERIL 5 MG TABS (CYCLOBENZAPRINE HCL) one tablet by mouth three times a day as needed for back spasm  #20 x 0   Entered and Authorized by:   Lemont Fillers FNP   Signed by:   Lemont Fillers FNP on 03/30/2010   Method used:   Electronically to        [facility] (retail)       [address]       [place], [place]  [address]       Ph: [phone] or [phone]       Fax: [phone]   RxID:   [id number] MOBIC 7.5 MG TABS (MELOXICAM) one tablet by mouth once daily as needed for back pain  #15 x 0   Entered and Authorized by:   Lemont Fillers FNP   Signed by:   Lemont Fillers FNP on 03/30/2010   Method used:   Electronically to        [facility] (retail)       [address]       [place], [place]  [address]       Ph: [phone] or [phone]       Fax: [phone]   RxID:    [id number]    Orders Added: 1)  UA Dipstick w/o Micro (manual) [81002] 2)  New Patient Level III [[address]]    Current Allergies (reviewed today): No known allergies   Laboratory Results   Urine Tests   Date/Time Reported: Mervin Kung CMA (AAMA)  March 30, 2010 4:08 PM   Routine Urinalysis   Color: yellow Appearance: Clear Glucose: negative   (Normal Range: Negative) Bilirubin: negative   (Normal Range: Negative) Ketone: negative   (Normal Range: Negative) Spec. Gravity: 1.010   (Normal Range: 1.003-1.035) Blood: negative   (Normal Range: Negative) pH: 5.0   (Normal Range: 5.0-8.0) Protein: negative   (Normal Range: Negative) Urobilinogen: 0.2   (Normal Range: 0-1) Nitrite: negative   (Normal Range: Negative) Leukocyte Esterace: negative   (Normal Range: Negative)

## 2010-05-29 NOTE — Assessment & Plan Note (Signed)
Summary: sore throat/congestion/tired/kdc   Vital Signs:  Patient profile:   50 year old female Weight:      193.13 pounds BMI:     34.89 O2 Sat:      97 % on Room air Temp:     97.2 degrees F oral Pulse rate:   76 / minute Pulse rhythm:   regular Resp:     20 per minute BP sitting:   100 / 60  (right arm) Cuff size:   regular  Vitals Entered By: Mervin Kung CMA (July 14, 2009 9:58 AM)  O2 Flow:  Room air CC: room 17  Pt states she has been sick all winter and it is getting worse. Congestion, productive cough with dark phlegm and feels short of breath. Comments Pt would like 30 day supply locally and 90 day supply for mail order (pt wants written rx).   CC:  room 17  Pt states she has been sick all winter and it is getting worse. Congestion and productive cough with dark phlegm and feels short of breath..  History of Present Illness: Allison Reyes is a 50 year old female who presents with c/o exhaustion/malaise.  Notes +cough, nasal congestion (both productive of green).  Notes sinus congestion all winter, but symptoms worsened 1 week ago.  Has used nasonex, nyquil, aleve- minimal improvement. + snoring according to her husband which is unusual for her.  Denies fever or visual changes.  Allergies (verified): No Known Drug Allergies  Physical Exam  General:  Well-developed,well-nourished,in no acute distress; alert,appropriate and cooperative throughout examination Head:  Normocephalic and atraumatic without obvious abnormalities. No apparent alopecia or balding. Eyes:  PERRLA Ears:  L TM + erythema, +bulging R TM mild erythema, no bulging/dull Mouth:  Oral mucosa and oropharynx without lesions or exudates.  Teeth in good repair. Neck:  No deformities, masses, or tenderness noted. Lungs:  Normal respiratory effort, chest expands symmetrically. Lungs are clear to auscultation, no crackles or wheezes. Heart:  Normal rate and regular rhythm. S1 and S2 normal without gallop,  murmur, click, rub or other extra sounds.   Impression & Recommendations:  Problem # 1:  OTITIS MEDIA, LEFT (ICD-382.9) Assessment New Will plan to treat with augmentin. The following medications were removed from the medication list:    Augmentin 875-125 Mg Tabs (Amoxicillin-pot clavulanate) .Marland Kitchen... Take 1 tablet by mouth two times a day for 14 tabs Her updated medication list for this problem includes:    Augmentin 500-125 Mg Tabs (Amoxicillin-pot clavulanate) ..... One tab by mouth two times a day x 10 days  Problem # 2:  ANXIETY DEPRESSION (ICD-300.4) Assessment: Unchanged Requests refill to [facility], +printout for mail order, notes symptoms are stabe on current dose of celexa  Problem # 3:  SINUSITIS - ACUTE-NOS (ICD-461.9) Assessment: New Augmentin as for #1.  Will also give tessalon for cough- may have mild bronchitis.  Lung exam normal however.  Pt instructed as  below. The following medications were removed from the medication list:    Augmentin 875-125 Mg Tabs (Amoxicillin-pot clavulanate) .Marland Kitchen... Take 1 tablet by mouth two times a day for 14 tabs Her updated medication list for this problem includes:    Nasonex 50 Mcg/act Susp (Mometasone furoate) .Marland Kitchen... 2 sprays each nostril once daily    Augmentin 500-125 Mg Tabs (Amoxicillin-pot clavulanate) ..... One tab by mouth two times a day x 10 days    Tessalon Perles 100 Mg Caps (Benzonatate) ..... One cap by mouth three times a day  as needed cough  Complete Medication List: 1)  Celexa 40 Mg Tabs (Citalopram hydrobromide) .... One tablet by mouth daily 2)  Zolpidem Tartrate 10 Mg Tabs (Zolpidem tartrate) .Marland Kitchen.. 1 by mouth at bedtime 3)  Synthroid 100 Mcg Tabs (Levothyroxine sodium) .... Take one tablet daily 4)  Nasonex 50 Mcg/act Susp (Mometasone furoate) .... 2 sprays each nostril once daily 5)  Augmentin 500-125 Mg Tabs (Amoxicillin-pot clavulanate) .... One tab by mouth two times a day x 10 days 6)  Tessalon Perles 100 Mg Caps  (Benzonatate) .... One cap by mouth three times a day as needed cough  Other Orders: Flu A+B ([address])  Patient Instructions: 1)  Call if you develop fever over 101, increasing sinus pressure, pain with eye movement, increased facial tenderness of swelling, or if you develop visual changes. Prescriptions: TESSALON PERLES 100 MG CAPS (BENZONATATE) one cap by mouth three times a day as needed cough  #30 x 0   Entered and Authorized by:   Lemont Fillers FNP   Signed by:   Lemont Fillers FNP on 07/14/2009   Method used:   Electronically to        [facility] (retail)       [address]       [place], [place]  [address]       Ph: [phone] or [phone]       Fax: [phone]   RxID:   [id number] AUGMENTIN 500-125 MG TABS (AMOXICILLIN-POT CLAVULANATE) one tab by mouth two times a day x 10 days  #20 x 0   Entered and Authorized by:   Lemont Fillers FNP   Signed by:   Lemont Fillers FNP on 07/14/2009   Method used:   Electronically to        [facility] (retail)       [address]       [place], [place]  [address]       Ph: [phone] or [phone]       Fax: [phone]   RxID:   [id number] CELEXA 40 MG TABS (CITALOPRAM HYDROBROMIDE) one tablet by mouth daily  #90 x 1   Entered and Authorized by:   Lemont Fillers FNP   Signed by:   Lemont Fillers FNP on 07/14/2009   Method used:   Print then Give to Patient   RxID:   [id number] CELEXA 40 MG TABS (CITALOPRAM HYDROBROMIDE) one tablet by mouth daily  #30 x 0   Entered and Authorized by:   Lemont Fillers FNP   Signed by:   Lemont Fillers FNP on 07/14/2009   Method used:   Electronically to        [facility] (retail)       [address]       [place], [place]  [address]       Ph: [phone] or [phone]       Fax: [phone]   RxID:    [id number]   Current Allergies (reviewed today): No known allergies           Laboratory Results    Other Tests  Influenza A: negative Influenza B: negative  Kit Test Internal QC: Positive   (Normal Range: Negative)

## 2010-05-29 NOTE — Letter (Signed)
   [place] [facility] [address] [place], [place]  [address] Phone: [phone]      June 22, 2009   [facility] [address] [place], [place] [address]  RE:  LAB RESULTS  Dear  Ms. Dekker,  The following is an interpretation of your most recent lab tests.  Please take note of any instructions provided or changes to medications that have resulted from your lab work.  LIPID PANEL:  Good - no changes needed Triglyceride: 153.0   Cholesterol: 188   LDL: 108   HDL: 49.50   Chol/HDL%:  4

## 2010-05-29 NOTE — Progress Notes (Signed)
Summary: Citalopram Refill  Phone Note Refill Request Message from:  Fax from Pharmacy on November 03, 2009 9:24 AM  Refills Requested: Medication #1:  CELEXA 40 MG TABS one tablet by mouth daily   Dosage confirmed as above?Dosage Confirmed   Brand Name Necessary? No   Supply Requested: 3 months [facility] Refill request   Method Requested: Electronic Next Appointment Scheduled: No future appointments Initial call taken by: Glendell Docker CMA,  November 03, 2009 9:25 AM  Follow-up for Phone Call        ok for refill.  #90, 1 refill Follow-up by: Neena Rhymes MD,  November 06, 2009 3:40 PM    Prescriptions: CELEXA 40 MG TABS (CITALOPRAM HYDROBROMIDE) one tablet by mouth daily  #90 x 1   Entered by:   Doristine Devoid   Authorized by:   Neena Rhymes MD   Signed by:   Doristine Devoid on 11/06/2009   Method used:   Faxed to ...       [facility] (mail-order)             , [place]         Ph: [phone]       Fax: [phone]   RxID:   [id number]

## 2010-05-31 NOTE — Progress Notes (Signed)
Summary: labs  Phone Note Outgoing Call   Call placed by: Doristine Devoid CMA,  May 08, 2010 1:15 PM Call placed to: Patient Summary of Call: total cholesterol and LDL have both improved.  LDL has increased by 70 points!  need to start Lipitor 40 mg nightly and repeat LFTs in 6-8 weeks.  also needs to work on healthy diet and regular exercise to improve these numbers.  remainder of labs look good.   Follow-up for Phone Call        spoke w/ patient aware of labs that medication needs to be started and will call if any concerns.....Marland KitchenMarland KitchenDoristine Devoid CMA  May 08, 2010 1:42 PM     New/Updated Medications: LIPITOR 40 MG TABS (ATORVASTATIN CALCIUM) take one tablet at bedtime Prescriptions: LIPITOR 40 MG TABS (ATORVASTATIN CALCIUM) take one tablet at bedtime  #30 x 3   Entered by:   Doristine Devoid CMA   Authorized by:   Neena Rhymes MD   Signed by:   Doristine Devoid CMA on 05/08/2010   Method used:   Electronically to        [facility] (retail)       [address]       [place], [place]  [address]       Ph: [phone] or [phone]       Fax: [phone]   RxID:   [id number]

## 2010-05-31 NOTE — Assessment & Plan Note (Signed)
Summary: cpx (and fasting labs??)///sph   Vital Signs:  Patient profile:   50 year old female Height:      62.50 inches Weight:      199 pounds BMI:     35.95 Pulse rate:   88 / minute BP sitting:   122 / 74  (left arm)  Vitals Entered By: Doristine Devoid CMA (May 04, 2010 10:05 AM) CC: CPX AND LABS W/ PAP   History of Present Illness: 50 yo woman here today for CPE.  overdue on mammogram.  UTD on colonoscopy.  osteopenia- on Vit D supplement, getting Ca from food sources- not supplements due to hyperparathyroidism.  seeing specialist at [facility] for this.  GERD- taking 2 prilosec daily.  will often vomit due to reflux.  cost of prilosec is a problem for pt.    Preventive Screening-Counseling & Management  Alcohol-Tobacco     Alcohol drinks/day: 2     Alcohol type: wine     Smoking Status: current     Smoking Cessation Counseling: yes     Smoke Cessation Stage: ready     Packs/Day: <0.25  Caffeine-Diet-Exercise     Does Patient Exercise: no      Sexual History:  currently monogamous.        Drug Use:  never.    Current Medications (verified): 1)  Celexa 40 Mg Tabs (Citalopram Hydrobromide) .... One Tablet By Mouth Daily 2)  Zolpidem Tartrate 10 Mg Tabs (Zolpidem Tartrate) .Marland Kitchen.. 1 By Mouth At Bedtime 3)  Synthroid 100 Mcg Tabs (Levothyroxine Sodium) .... Take One Tablet Daily 4)  Nasonex 50 Mcg/act Susp (Mometasone Furoate) .... 2 Sprays Each Nostril Once Daily 5)  Vitamin D 1000 Unit Tabs (Cholecalciferol) .... Take 1 Tablet By Mouth Once A Day. 6)  Mobic 7.5 Mg Tabs (Meloxicam) .... One Tablet By Mouth Once Daily As Needed For Back Pain 7)  Flexeril 5 Mg Tabs (Cyclobenzaprine Hcl) .... One Tablet By Mouth Three Times A Day As Needed For Back Spasm 8)  Requip 1 Mg Tabs (Ropinirole Hcl) .... One Tablet By Mouth At Bedtime For Restless Legs 9)  Nexium 40 Mg Cpdr (Esomeprazole Magnesium) .... Take 1 Tab Each Morning  Allergies (verified): No Known Drug  Allergies  Past History:  Past medical, surgical, family and social histories (including risk factors) reviewed, and no changes noted (except as noted below).  Past Medical History: Depression Hyperparathyroid  Osteopenia  Past Surgical History: Reviewed history from 11/04/2008 and no changes required. 1/2 thyroid removed Endometriosis Hysterectomy Oophorectomy  Family History: Reviewed history from 11/04/2008 and no changes required. CAD-mother deceased 33 MI HTN-father,mother DM-no STROKE-paternal grandfather COLON CA-no BREAST CA-no  Social History: Reviewed history from 11/04/2008 and no changes required. lives w/ husband, daughter, son (1999) older daughter (1985) working at [employer] in [place]  Review of Systems       The patient complains of hoarseness, dyspnea on exertion, prolonged cough, hematochezia, and severe indigestion/heartburn.  The patient denies anorexia, fever, weight loss, weight gain, vision loss, decreased hearing, chest pain, syncope, peripheral edema, headaches, abdominal pain, melena, hematuria, suspicious skin lesions, depression, abnormal bleeding, enlarged lymph nodes, and breast masses.         SOB due to weight and out of shape per pt hoarseness and cough due to GERD BRBPR- hemorrhoids  Physical Exam  General:  Well-developed,well-nourished,in no acute distress; alert,appropriate and cooperative throughout examination Head:  Normocephalic and atraumatic without obvious abnormalities. No apparent alopecia or balding.  Eyes:  PERRLA, EOMI, fundi WNL Ears:  External ear exam shows no significant lesions or deformities.  Otoscopic examination reveals clear canals, tympanic membranes are intact bilaterally without bulging, retraction, inflammation or discharge. Hearing is grossly normal bilaterally. Nose:  External nasal examination shows no deformity or inflammation. Nasal mucosa are pink and moist without lesions  or exudates. Mouth:  Oral mucosa and oropharynx without lesions or exudates.  Teeth in good repair. Neck:  No deformities, masses, or tenderness noted. Lungs:  Normal respiratory effort, chest expands symmetrically. Lungs are clear to auscultation, no crackles or wheezes. Heart:  Normal rate and regular rhythm. S1 and S2 normal without gallop, murmur, click, rub or other extra sounds. Abdomen:  Bowel sounds positive,abdomen soft and non-tender without masses, organomegaly or hernias noted. Pulses:  +2 carotid, radial, DP Extremities:  No clubbing, cyanosis, edema, or deformity noted with normal full range of motion of all joints.   Neurologic:  No cranial nerve deficits noted. Station and gait are normal. Plantar reflexes are down-going bilaterally. DTRs are symmetrical throughout. Sensory, motor and coordinative functions appear intact. Skin:  Intact without suspicious lesions or rashes Cervical Nodes:  No lymphadenopathy noted Axillary Nodes:  No palpable lymphadenopathy Psych:  Cognition and judgment appear intact. Alert and cooperative with normal attention span and concentration. No apparent delusions, illusions, hallucinations   Impression & Recommendations:  Problem # 1:  PHYSICAL EXAMINATION (ICD-V70.0) Assessment Unchanged pt's PE WNL w/ exception of weight.  EKG done as baseline.  check labs.  anticipatory guidance provided. Orders: EKG w/ Interpretation (93000) Specimen Handling (04540) TLB-BMP (Basic Metabolic Panel-BMET) (80048-METABOL) TLB-CBC Platelet - w/Differential (85025-CBCD)  Problem # 2:  GERD (ICD-530.81) Assessment: New pt has failed OTC Prilosec.  switch to Nexium.  check H pylori.  if sxs continue will refer to GI.  Pt expresses understanding and is in agreement w/ this plan. Her updated medication list for this problem includes:    Nexium 40 Mg Cpdr (Esomeprazole magnesium) .Marland Kitchen... Take 1 tab each morning  Orders: TLB-H. Pylori Abs(Helicobacter Pylori)  (86677-HELICO)  Problem # 3:  HOT FLASHES (ICD-627.2) Assessment: New refer to GYN as pt has been post menopausal due to hysterectomy for 7-8 yrs.  GYN will perform pap and breast exam. Orders: Gynecologic Referral (Gyn)  Complete Medication List: 1)  Celexa 40 Mg Tabs (Citalopram hydrobromide) .... One tablet by mouth daily 2)  Zolpidem Tartrate 10 Mg Tabs (Zolpidem tartrate) .Marland Kitchen.. 1 by mouth at bedtime 3)  Synthroid 100 Mcg Tabs (Levothyroxine sodium) .... Take one tablet daily 4)  Nasonex 50 Mcg/act Susp (Mometasone furoate) .... 2 sprays each nostril once daily 5)  Vitamin D 1000 Unit Tabs (Cholecalciferol) .... Take 1 tablet by mouth once a day. 6)  Mobic 7.5 Mg Tabs (Meloxicam) .... One tablet by mouth once daily as needed for back pain 7)  Flexeril 5 Mg Tabs (Cyclobenzaprine hcl) .... One tablet by mouth three times a day as needed for back spasm 8)  Requip 1 Mg Tabs (Ropinirole hcl) .... One tablet by mouth at bedtime for restless legs 9)  Nexium 40 Mg Cpdr (Esomeprazole magnesium) .... Take 1 tab each morning  Other Orders: Venipuncture (98119) T-Vitamin D (25-Hydroxy) (14782-95621) TLB-Lipid Panel (80061-LIPID) TLB-Hepatic/Liver Function Pnl (80076-HEPATIC) TLB-TSH (Thyroid Stimulating Hormone) (30865-HQI)  Patient Instructions: 1)  We'll notify you of your lab results 2)  Someone will call you with your GYN appt 3)  Start the Nexium daily rather than the Prilosec 4)  If the reflux doesn't  improve w/ Nexium- please call and we'll refer you to GI 5)  Try and get regular exercise 6)  Call with any questions or concerns 7)  Hang in there! Prescriptions: CELEXA 40 MG TABS (CITALOPRAM HYDROBROMIDE) one tablet by mouth daily  #90 x 3   Entered and Authorized by:   Neena Rhymes MD   Signed by:   Neena Rhymes MD on 05/04/2010   Method used:   Electronically to        [facility] [address] (retail)       [address]       [place], [place]  [address]       Ph: [phone] or [phone]       Fax: [phone]   RxID:   [id number] SYNTHROID 100 MCG TABS (LEVOTHYROXINE SODIUM) take one tablet daily Brand medically necessary #90 x 3   Entered and Authorized by:   Neena Rhymes MD   Signed by:   Neena Rhymes MD on 05/04/2010   Method used:   Electronically to        [facility] [address] (retail)       [address]       [place], [place]  [address]       Ph: [phone] or [phone]       Fax: [phone]   RxID:   [id number] NASONEX 50 MCG/ACT SUSP (MOMETASONE FUROATE) 2 sprays each nostril once daily  #1 x 3   Entered and Authorized by:   Neena Rhymes MD   Signed by:   Neena Rhymes MD on 05/04/2010   Method used:   Electronically to        [facility] [address] (retail)       [address]       [place], [place]  [address]       Ph: [phone] or [phone]       Fax: [phone]   RxID:   [id number] NEXIUM 40 MG CPDR (ESOMEPRAZOLE MAGNESIUM) Take 1 tab each morning  #30 x 6   Entered and Authorized by:   Neena Rhymes MD   Signed by:   Neena Rhymes MD on 05/04/2010   Method used:   Electronically to        [address] (retail)       [address]       [place], [place]  [address]       Ph: [phone] or [phone]       Fax: [phone]   RxID:   [id number]    Orders Added: 1)  Venipuncture [32671] 2)  T-Vitamin D (25-Hydroxy) [phone] 3)  EKG w/ Interpretation [93000] 4)  Specimen Handling [99000] 5)  Gynecologic Referral [Gyn] 6)  TLB-Lipid Panel [80061-LIPID] 7)  TLB-Hepatic/Liver Function Pnl [80076-HEPATIC] 8)  TLB-TSH (Thyroid Stimulating Hormone) [84443-TSH] 9)  TLB-BMP (Basic Metabolic Panel-BMET) [80048-METABOL] 10)  TLB-CBC Platelet - w/Differential [85025-CBCD] 11)  TLB-H. Pylori Abs(Helicobacter Pylori) [86677-HELICO] 12)  Est. Patient 40-64  years [82505]

## 2010-05-31 NOTE — Progress Notes (Signed)
Summary: labs before Physical??-lmom  Phone Note Call from Patient Call back at Home Phone [phone]   Caller: Patient Summary of Call: patient is scheduled for CPX on 05/04/2010 ---was told when she picked up thyroid Prescription earlier in month that she needed lab work to test thyroid levels---do you need her to have that lab before her 05/04/2010 appt??     Do you want her to have all the CPX fasting labs before the 05/04/2010 appt  (patient says she would like to have all labs before the CPX so you can talk to her about them)-----will need orders and codes please Initial call taken by: Jerolyn Shin,  April 25, 2010 10:23 AM  Follow-up for Phone Call        left message on machine ...........Marland KitchenDoristine Devoid CMA  April 25, 2010 3:57 PM

## 2010-07-23 ENCOUNTER — Ambulatory Visit (INDEPENDENT_AMBULATORY_CARE_PROVIDER_SITE_OTHER): Payer: BC Managed Care – PPO | Admitting: Internal Medicine

## 2010-07-23 ENCOUNTER — Encounter: Payer: Self-pay | Admitting: Internal Medicine

## 2010-07-23 ENCOUNTER — Ambulatory Visit: Payer: Self-pay | Admitting: Internal Medicine

## 2010-07-23 DIAGNOSIS — J019 Acute sinusitis, unspecified: Secondary | ICD-10-CM

## 2010-07-23 MED ORDER — ESOMEPRAZOLE MAGNESIUM 40 MG PO CPDR: 40.0000 mg | DELAYED_RELEASE_CAPSULE | Freq: Every day | ORAL | Status: DC

## 2010-07-23 MED ORDER — CHLORPHENIRAMINE-HYDROCODONE 8-10 MG/5ML PO LQCR: 5.0000 mL | Freq: Two times a day (BID) | ORAL | Status: DC | PRN

## 2010-07-23 MED ORDER — ROPINIROLE HCL 1 MG PO TABS: 1.0000 mg | ORAL_TABLET | Freq: Every day | ORAL | Status: DC

## 2010-07-23 MED ORDER — AMOXICILLIN 500 MG PO CAPS: 1000.0000 mg | ORAL_CAPSULE | Freq: Two times a day (BID) | ORAL | Status: AC

## 2010-07-23 MED ORDER — LEVOTHYROXINE SODIUM 100 MCG PO TABS: 100.0000 ug | ORAL_TABLET | Freq: Every day | ORAL | Status: DC

## 2010-07-23 NOTE — Progress Notes (Signed)
  Subjective:    Patient ID: Allison Reyes, female    DOB: 1960/08/05, 50 y.o.   MRN: [id number]  HPI  complaining of sinus congestion, pressure, cough with sputum production, usually green. She also has green nasal discharge. Sx are going on x months  States that she has seen "several doctors" including doctors at [facility] where she works, she was told she has allergies and prescribed allergy medications with no help.    Review of Systems  no fever  no difficulty breathing,  Occasional wheezing, still smokes about a half-pack a day  admits to itchy eyes and nose as well as some itching at the throat     Objective:   Physical Exam  Constitutional: She appears well-developed.        Overweight appearing  HENT:  Head: Normocephalic and atraumatic.  Left Ear: External ear normal.        Left tympanic membrane slightly bulged , no red or  discharge. Face is symmetric and  Moderately tender to palpation of both maxillary sinuses  Neck: Normal range of motion. Neck supple.  Cardiovascular: Normal rate, regular rhythm and normal heart sounds.   Pulmonary/Chest: Effort normal and breath sounds normal.          Assessment & Plan:

## 2010-07-23 NOTE — Assessment & Plan Note (Signed)
Several months history of sinus pressure, cough and nasal discharge. She also has symptoms consistent with allergies. At this point we will treat her as if she has acute sinusitis, see prescription and instructions  If  she is no better we'll have to due for her workup including maybe a CBC and a chest x-ray

## 2010-07-23 NOTE — Patient Instructions (Signed)
Continue nasonex  Zyrtec 10mg  OTC 1 a day Amoxicillin as prescribed x 10 days For cough: stop smoking!, mucinex DM OTC as needed If the cough is severe or persist, use Tussionex (will cause drowsiness) Call if no better in few days See Dr Beverely Low as recommended for follow up on the cholesterol issue

## 2010-08-29 ENCOUNTER — Telehealth: Payer: Self-pay | Admitting: Family Medicine

## 2010-08-29 MED ORDER — CITALOPRAM HYDROBROMIDE 40 MG PO TABS: 40.0000 mg | ORAL_TABLET | Freq: Every day | ORAL | Status: DC

## 2010-08-29 NOTE — Telephone Encounter (Signed)
Called [facility] to refill Citalopram--they told her she was out of refills and to call her doctors office

## 2010-08-29 NOTE — Telephone Encounter (Signed)
#  90 x3 given on 05/04/10 at CPX.

## 2010-08-29 NOTE — Telephone Encounter (Signed)
This was originally sent to [facility] locally. Ok to change to mail order and still provide 1 year or do you prefer 6 months?

## 2010-08-29 NOTE — Telephone Encounter (Signed)
If she was given a year's worth of meds then [facility] shouldn't make her contact us.  We can re-send if needed

## 2010-08-29 NOTE — Telephone Encounter (Signed)
Ok for 1 year. 

## 2010-08-29 NOTE — Telephone Encounter (Signed)
Done . Pt notified.  

## 2010-09-06 ENCOUNTER — Other Ambulatory Visit: Payer: Self-pay | Admitting: *Deleted

## 2010-09-06 MED ORDER — ESOMEPRAZOLE MAGNESIUM 40 MG PO CPDR: 40.0000 mg | DELAYED_RELEASE_CAPSULE | Freq: Every day | ORAL | Status: DC

## 2010-09-06 MED ORDER — ROPINIROLE HCL 1 MG PO TABS: 1.0000 mg | ORAL_TABLET | Freq: Every day | ORAL | Status: DC

## 2010-09-06 NOTE — Telephone Encounter (Signed)
Going to switch to [employer].

## 2010-09-14 NOTE — Op Note (Signed)
Allison Reyes, Allison Reyes            ACCOUNT NO.:  [id number]   MEDICAL RECORD NO.:  [id number]          PATIENT TYPE:  AMB   LOCATION:  SDS                          FACILITY:  [facility]   PHYSICIAN:  Velora Heckler, MD      DATE OF BIRTH:  1961/02/06   DATE OF PROCEDURE:  12/09/2005  DATE OF DISCHARGE:                                 OPERATIVE REPORT   PREOPERATIVE DIAGNOSIS:  Primary hyperparathyroidism.   POSTOPERATIVE DIAGNOSIS:  Primary hyperparathyroidism.   PROCEDURE:  1. Neck exploration with biopsy of three parathyroid glands.  2. Right thyroid lobectomy.   SURGEON:  Velora Heckler, MD, FACS   ASSISTANT:  Manus Rudd, MD, FACS   ANESTHESIA:  General per Dr. Diamantina Monks.   ESTIMATED BLOOD LOSS:  Minimal.   PREPARATION:  Betadine.   COMPLICATIONS:  None.   INDICATIONS:  The patient is a 50 year old white female from [place],  [place] who presents at the request of Dr. Dorisann Frames and Dr.  Leodis Sias for treatment of primary hyperparathyroidism.  The patient has  had extensive workup including laboratory studies demonstrating an elevated  intact parathyroid hormone level, elevated calcium level, and elevated  urinary calcium levels.  The patient has had a bone density scan showing  osteopenia.  The patient has had two sestamibi scans which have not  localized an adenoma but did show asymmetric activity in the right thyroid  lobe.  The patient has had an MRI scan which failed to identify a  parathyroid adenoma.  The patient now comes to surgery for neck exploration  and parathyroidectomy for management of primary hyperparathyroidism.   FINDINGS AT OPERATION:  Three normal parathyroid glands were identified in  the right inferior, left inferior, and left superior positions.  A right  superior parathyroid gland was not identified during the neck exploration.  Therefore a decision was made to proceed with right thyroid lobectomy.  On  frozen section  parathyroid tissue was identified in the right thyroid lobe  but appeared normal and not hyperplastic or adenomatous.  Therefore it is  believed that four discrete parathyroid glands were identified but no  evidence of parathyroid adenoma was found.   BODY OF REPORT:  Procedure is done in OR #16 at [facility].  The patient is brought to the operating room, placed in supine  position on the operating room table.  Following administration of general  anesthesia the patient is positioned and then prepped and draped in usual  strict aseptic fashion.  After ascertaining that an adequate level of  anesthesia been obtained, a right lower anterior neck incision was made with  a #15 blade.  Incision is approximately 3 cm long.  It was carried down  through subcutaneous tissues and platysma.  Hemostasis was obtained with  electrocautery.  Skin flaps were developed cephalad and caudad.  Strap  muscles were incised in the midline and the strap muscles are reflected to  the right.  Inferior pole of the right thyroid lobe is exposed.  There is a  nodular density measuring approximately 1 cm just immediately  inferior to  the right thyroid lobe.  This has the appearance of thyroid tissue.  It is  dissected out.  Vascular structures were divided between hemoclips.  Nodule  was excised completely and submitted to pathology where frozen section  confirms thyroid tissue.  Further dissection in the right lower neck reveals  what appears to be a normal right inferior parathyroid gland.  Based on  these findings a decision was made to extend the incision across the midline  for a full neck exploration.  Therefore the incision is extended to the  left.  Skin flaps were developed cephalad and caudad and a Mahorner self-  retaining retractor was placed for exposure.  The left inferior position is  explored next.  Again the inferior pole of thyroid lobe was exposed.  Dissection in the  tracheoesophageal groove does not initially reveal any  parathyroid tissue.  Therefore dissection was carried inferiorly into the  thyrothymic tract.  Thyrothymic tract tissue is mobilized and excised in its  entirety.  It is sectioned on the table but does not appear to contain a  parathyroid adenoma.  It is submitted as specimen for permanent sections  only.  Further dissection inferiorly does not reveal an adenoma.  Dissection  was then carried into the left superior position where a normal-appearing  left superior parathyroid was identified but again no evidence of adenoma.   Next, we turned our attention back to the right side and explored  superiorly.  Again a thorough exploration was performed of this region and  no evidence of adenoma.  No parathyroid tissue was identified in the right  superior position.  Left inferior position was again dissected out.  Again  the normal parathyroid gland is identified.  Dissection was carried  inferiorly into the thyrothymic tract along the tracheoesophageal groove  without evidence of adenoma.  At this point the retroesophageal space is  opened.  The retroesophageal space is explored into the posterior  mediastinum with no evidence of adenoma.  Dissection was carried cephalad to  the hypopharynx again without evidence of adenoma.  Next the carotid sheaths  were opened bilaterally and explored with again no evidence of parathyroid  adenoma identified.  Next the left inferior, right inferior and left  superior parathyroid glands were biopsied by transecting at the midpoint of  the gland with a medium Ligaclip.  Tissues is submitted to pathology where  Dr. Renato Battles inspected each of the glands and noted parathyroid tissue  for the left superior, left inferior and right inferior positions.  Therefore at this point a decision was made to proceed with right thyroid  lobectomy.  Superior pole vessels were dissected out, ligated in continuity with  2-0 silk ties and medium hemoclips, and divided.  Gland is rolled  medially.  Branches of the inferior thyroid artery are divided between small  Ligaclips.  Inferior venous tributaries were divided between small  Ligaclips.  Dissection was carried down the ligament of Allyson Sabal which is  transected with electrocautery.  Gland is mobilized up and off of the  trachea.  It is mobilized across the isthmus.  The isthmus was divided  between hemostats and suture ligated with 3-0 Vicryl suture ligature.  Further inspection in the tracheoesophageal groove and along the hypopharynx  fails to reveal any evidence of adenoma on the right.  The right thyroid  lobe is sectioned on the table without obvious evidence of adenoma in the  intrathyroidal location.  Therefore the specimen is marked with  a suture at  the superior pole and submitted to pathology for review.  Dr. Renato Battles  did review the specimen and did perform frozen section biopsies and did find  what appears to be a small amount of normal parathyroid tissue in the right  thyroid lobe.  Again no evidence of adenoma however was identified.  Neck is  irrigated with warm saline.  Good hemostasis was obtained.  Surgicel was  placed in the operative field bilaterally.  Strap muscles were  reapproximated in the midline with interrupted 3-0 Vicryl sutures.  Platysma  was closed with interrupted 3-0 Vicryl sutures.  Skin was closed with  running 4-0 Vicryl subcuticular suture.  Wound is washed and dried.  Benzoin, Steri-Strips are applied.  Sterile  dressings were applied.  The patient is awakened from anesthesia and brought  to the recovery room in stable condition.  The patient tolerated the  procedure well.      Velora Heckler, MD  Electronically Signed     TMG/MEDQ  D:  12/09/2005  T:  12/10/2005  Job:  [id number]   cc:   Dorisann Frames, M.D.  Francis P. Modesto Charon, M.D.

## 2010-12-25 ENCOUNTER — Other Ambulatory Visit: Payer: Self-pay | Admitting: Internal Medicine

## 2010-12-25 NOTE — Telephone Encounter (Signed)
Requip request [last refilled 09/06/10 #90x0]

## 2010-12-26 ENCOUNTER — Other Ambulatory Visit: Payer: Self-pay | Admitting: *Deleted

## 2010-12-26 MED ORDER — LEVOTHYROXINE SODIUM 100 MCG PO TABS: 100.0000 ug | ORAL_TABLET | Freq: Every day | ORAL | Status: DC

## 2010-12-26 NOTE — Telephone Encounter (Signed)
Ok to refill #90, 3 refills 

## 2011-01-03 HISTORY — PX: NASAL SINUS SURGERY: SHX719

## 2011-01-29 ENCOUNTER — Encounter: Payer: Self-pay | Admitting: Family

## 2011-01-29 ENCOUNTER — Ambulatory Visit (INDEPENDENT_AMBULATORY_CARE_PROVIDER_SITE_OTHER): Payer: BC Managed Care – PPO | Admitting: Family

## 2011-01-29 DIAGNOSIS — S335XXA Sprain of ligaments of lumbar spine, initial encounter: Secondary | ICD-10-CM

## 2011-01-29 DIAGNOSIS — S39012A Strain of muscle, fascia and tendon of lower back, initial encounter: Secondary | ICD-10-CM

## 2011-01-29 DIAGNOSIS — R635 Abnormal weight gain: Secondary | ICD-10-CM

## 2011-01-29 MED ORDER — CYCLOBENZAPRINE HCL 5 MG PO TABS: 5.0000 mg | ORAL_TABLET | Freq: Three times a day (TID) | ORAL | Status: AC | PRN

## 2011-01-29 MED ORDER — MELOXICAM 15 MG PO TABS: 15.0000 mg | ORAL_TABLET | Freq: Every day | ORAL | Status: AC

## 2011-01-29 NOTE — Assessment & Plan Note (Signed)
Last TSH on file was normal and follow up testing reportedly normal per pt at [facility].  She was advised to try to follow a 1200 calorie diet and exercise 5 days a week.

## 2011-01-29 NOTE — Patient Instructions (Signed)
Try to keep your calories to 1200 calories a day. Try a calorie counting guide such as https://peterson.info/. Call if your back pain worsens or if it is not resolved in 1 month.
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
## 2011-01-29 NOTE — Assessment & Plan Note (Addendum)
Will plan to treat with short term Mobic and flexeril.  She was advised not to drive after taking flexeril due to risk of drowsiness.  She will contact us is symptoms worsen or if not resolved in 1 month.

## 2011-02-08 LAB — PTH, INTACT AND CALCIUM
Calcium, Total (PTH): 10
Calcium, Total (PTH): 9.2
Calcium, Total (PTH): 9.3
Calcium, Total (PTH): 9.4
Calcium, Total (PTH): 9.5
Calcium, Total (PTH): 9.5
Calcium, Total (PTH): 9.6
Calcium, Total (PTH): 9.6
Calcium, Total (PTH): 9.7
Calcium, Total (PTH): 9.7
Calcium, Total (PTH): 9.9
PTH: 121.4 — ABNORMAL HIGH
PTH: 127.5 — ABNORMAL HIGH
PTH: 142.4 — ABNORMAL HIGH
PTH: 145.7 — ABNORMAL HIGH
PTH: 148.5 — ABNORMAL HIGH
PTH: 152.7 — ABNORMAL HIGH
PTH: 156.8 — ABNORMAL HIGH
PTH: 180.2 — ABNORMAL HIGH
PTH: 245.4 — ABNORMAL HIGH
PTH: 267.8 — ABNORMAL HIGH
PTH: 328 — ABNORMAL HIGH

## 2011-02-08 LAB — BASIC METABOLIC PANEL
BUN: 12
CO2: 29
Calcium: 10.2
Chloride: 108
Creatinine, Ser: 0.74
GFR calc Af Amer: >60
GFR calc non Af Amer: >60
Glucose, Bld: 97
Potassium: 4.2
Sodium: 140

## 2011-03-24 ENCOUNTER — Other Ambulatory Visit: Payer: Self-pay | Admitting: Internal Medicine

## 2011-03-26 NOTE — Telephone Encounter (Signed)
rx sent to pharmacy by e-script  

## 2011-07-05 ENCOUNTER — Encounter: Payer: Self-pay | Admitting: Family Medicine

## 2011-07-05 ENCOUNTER — Ambulatory Visit (INDEPENDENT_AMBULATORY_CARE_PROVIDER_SITE_OTHER): Payer: BC Managed Care – PPO | Admitting: Family Medicine

## 2011-07-05 ENCOUNTER — Telehealth: Payer: Self-pay | Admitting: Family Medicine

## 2011-07-05 VITALS — BP 122/76 | HR 64 | Temp 98.4°F | Wt 208.0 lb

## 2011-07-05 DIAGNOSIS — J019 Acute sinusitis, unspecified: Secondary | ICD-10-CM

## 2011-07-05 DIAGNOSIS — E039 Hypothyroidism, unspecified: Secondary | ICD-10-CM

## 2011-07-05 DIAGNOSIS — E213 Hyperparathyroidism, unspecified: Secondary | ICD-10-CM

## 2011-07-05 DIAGNOSIS — M899 Disorder of bone, unspecified: Secondary | ICD-10-CM

## 2011-07-05 DIAGNOSIS — E785 Hyperlipidemia, unspecified: Secondary | ICD-10-CM

## 2011-07-05 MED ORDER — BENZONATATE 200 MG PO CAPS: 200.0000 mg | ORAL_CAPSULE | Freq: Three times a day (TID) | ORAL | Status: AC | PRN

## 2011-07-05 MED ORDER — GUAIFENESIN-CODEINE 100-10 MG/5ML PO SYRP: 10.0000 mL | ORAL_SOLUTION | Freq: Three times a day (TID) | ORAL | Status: AC | PRN

## 2011-07-05 MED ORDER — CLARITHROMYCIN ER 500 MG PO TB24: 1000.0000 mg | ORAL_TABLET | Freq: Every day | ORAL | Status: AC

## 2011-07-05 MED ORDER — ZOLPIDEM TARTRATE 10 MG PO TABS: 10.0000 mg | ORAL_TABLET | Freq: Every evening | ORAL | Status: DC | PRN

## 2011-07-05 NOTE — Telephone Encounter (Signed)
All labs entered in today's OV as future orders

## 2011-07-05 NOTE — Patient Instructions (Signed)
Schedule your complete physical at your convenience Start the Biaxin- 2 tabs at the same time daily (w/ food) Use the cough syrup at night, cough pills for day Call with any questions or concerns Hang in there!!!

## 2011-07-05 NOTE — Assessment & Plan Note (Signed)
New.  sxs and PE consistent w/ infxn.  Switch to Biaxin.  Cough meds prn.  Reviewed supportive care and red flags that should prompt return.  Pt expressed understanding and is in agreement w/ plan.

## 2011-07-05 NOTE — Telephone Encounter (Signed)
Patient has a CPE in April & wants to come in earlier for labs. Can you tell which labs & diagnosis she will need please? Thanks

## 2011-07-05 NOTE — Progress Notes (Signed)
  Subjective:    Patient ID: Allison Reyes, female    DOB: 04-Jul-1960, 51 y.o.   MRN: [id number]  HPI URI- had sinus surgery 6 months ago at [facility], was given script in case of infxn (keflex 250mg ).  Now w/ L ear pain, sore throat, hoarseness, 'the cough is ridiculous', + nasal congestion, facial pain/pressure.  sxs started 10 days ago.  No fevers.  + sick contacts.   Review of Systems For ROS see HPI     Objective:   Physical Exam  Vitals reviewed. Constitutional: She appears well-developed and well-nourished. No distress.  HENT:  Head: Normocephalic and atraumatic.  Right Ear: Tympanic membrane normal.  Left Ear: Tympanic membrane normal.  Nose: Mucosal edema and rhinorrhea present. Right sinus exhibits maxillary sinus tenderness and frontal sinus tenderness. Left sinus exhibits maxillary sinus tenderness and frontal sinus tenderness.  Mouth/Throat: Uvula is midline and mucous membranes are normal. Posterior oropharyngeal erythema present. No oropharyngeal exudate.  Eyes: Conjunctivae and EOM are normal. Pupils are equal, round, and reactive to light.  Neck: Normal range of motion. Neck supple.  Cardiovascular: Normal rate, regular rhythm and normal heart sounds.   Pulmonary/Chest: Effort normal and breath sounds normal. No respiratory distress. She has no wheezes.       Dry cough  Lymphadenopathy:    She has no cervical adenopathy.          Assessment & Plan:

## 2011-07-05 NOTE — Telephone Encounter (Signed)
CBC with Differential, Basic metabolic panel, Hepatic function panel, TSH ,Lipid panel, Vitamin D 1,25 dihydroxy, PTH, Intact and Calcium,

## 2011-07-28 ENCOUNTER — Other Ambulatory Visit: Payer: Self-pay | Admitting: Internal Medicine

## 2011-07-29 NOTE — Telephone Encounter (Signed)
Pt has not been seen by you within a year. Ok to refill synthroid?

## 2011-08-06 ENCOUNTER — Other Ambulatory Visit (INDEPENDENT_AMBULATORY_CARE_PROVIDER_SITE_OTHER): Payer: BC Managed Care – PPO

## 2011-08-06 DIAGNOSIS — M949 Disorder of cartilage, unspecified: Secondary | ICD-10-CM

## 2011-08-06 DIAGNOSIS — E213 Hyperparathyroidism, unspecified: Secondary | ICD-10-CM

## 2011-08-06 DIAGNOSIS — M899 Disorder of bone, unspecified: Secondary | ICD-10-CM

## 2011-08-06 DIAGNOSIS — E785 Hyperlipidemia, unspecified: Secondary | ICD-10-CM

## 2011-08-06 DIAGNOSIS — E039 Hypothyroidism, unspecified: Secondary | ICD-10-CM

## 2011-08-06 LAB — CBC WITH DIFFERENTIAL/PLATELET
Basophils Absolute: 0.1 10*3/uL (ref 0.0–0.1)
Basophils Relative: 1.6 % (ref 0.0–3.0)
Eosinophils Absolute: 0.1 10*3/uL (ref 0.0–0.7)
Eosinophils Relative: 3 % (ref 0.0–5.0)
HCT: 39 % (ref 36.0–46.0)
Hemoglobin: 13 g/dL (ref 12.0–15.0)
Lymphocytes Relative: 21.8 % (ref 12.0–46.0)
Lymphs Abs: 1 10*3/uL (ref 0.7–4.0)
MCHC: 33.4 g/dL (ref 30.0–36.0)
MCV: 94.3 fl (ref 78.0–100.0)
Monocytes Absolute: 0.4 10*3/uL (ref 0.1–1.0)
Monocytes Relative: 9.5 % (ref 3.0–12.0)
Neutro Abs: 3 10*3/uL (ref 1.4–7.7)
Neutrophils Relative %: 64.1 % (ref 43.0–77.0)
Platelets: 243 10*3/uL (ref 150.0–400.0)
RBC: 4.13 Mil/uL (ref 3.87–5.11)
RDW: 13.3 % (ref 11.5–14.6)
WBC: 4.6 10*3/uL (ref 4.5–10.5)

## 2011-08-06 LAB — LIPID PANEL
Cholesterol: 245 mg/dL — ABNORMAL HIGH (ref 0–200)
HDL: 44.9 mg/dL (ref >39.00)
Total CHOL/HDL Ratio: 5
Triglycerides: 199 mg/dL — ABNORMAL HIGH (ref 0.0–149.0)
VLDL: 39.8 mg/dL (ref 0.0–40.0)

## 2011-08-06 LAB — BASIC METABOLIC PANEL
BUN: 15 mg/dL (ref 6–23)
CO2: 26 mEq/L (ref 19–32)
Calcium: 10 mg/dL (ref 8.4–10.5)
Chloride: 111 mEq/L (ref 96–112)
Creatinine, Ser: 0.9 mg/dL (ref 0.4–1.2)
GFR: 74.97 mL/min (ref >60.00)
Glucose, Bld: 99 mg/dL (ref 70–99)
Potassium: 4.8 mEq/L (ref 3.5–5.1)
Sodium: 148 mEq/L — ABNORMAL HIGH (ref 135–145)

## 2011-08-06 LAB — LDL CHOLESTEROL, DIRECT: Direct LDL: 167.8 mg/dL

## 2011-08-06 LAB — HEPATIC FUNCTION PANEL
ALT: 33 U/L (ref 0–35)
AST: 25 U/L (ref 0–37)
Albumin: 3.9 g/dL (ref 3.5–5.2)
Alkaline Phosphatase: 106 U/L (ref 39–117)
Bilirubin, Direct: 0 mg/dL (ref 0.0–0.3)
Total Bilirubin: 0.3 mg/dL (ref 0.3–1.2)
Total Protein: 6.7 g/dL (ref 6.0–8.3)

## 2011-08-06 LAB — TSH: TSH: 3.33 u[IU]/mL (ref 0.35–5.50)

## 2011-08-08 NOTE — Progress Notes (Unsigned)
Left pt a message on home and cell  to come in, to finish blood work for the [employer] .Marland Kitchen

## 2011-08-13 ENCOUNTER — Ambulatory Visit (INDEPENDENT_AMBULATORY_CARE_PROVIDER_SITE_OTHER): Payer: BC Managed Care – PPO | Admitting: Family Medicine

## 2011-08-13 ENCOUNTER — Encounter: Payer: Self-pay | Admitting: Family Medicine

## 2011-08-13 VITALS — BP 116/80 | HR 71 | Temp 98.8°F | Ht 63.5 in | Wt 211.0 lb

## 2011-08-13 DIAGNOSIS — E213 Hyperparathyroidism, unspecified: Secondary | ICD-10-CM

## 2011-08-13 DIAGNOSIS — R059 Cough, unspecified: Secondary | ICD-10-CM

## 2011-08-13 DIAGNOSIS — R05 Cough: Secondary | ICD-10-CM

## 2011-08-13 DIAGNOSIS — E669 Obesity, unspecified: Secondary | ICD-10-CM

## 2011-08-13 DIAGNOSIS — K219 Gastro-esophageal reflux disease without esophagitis: Secondary | ICD-10-CM

## 2011-08-13 DIAGNOSIS — Z23 Encounter for immunization: Secondary | ICD-10-CM

## 2011-08-13 DIAGNOSIS — N393 Stress incontinence (female) (male): Secondary | ICD-10-CM

## 2011-08-13 DIAGNOSIS — Z780 Asymptomatic menopausal state: Secondary | ICD-10-CM

## 2011-08-13 DIAGNOSIS — Z1231 Encounter for screening mammogram for malignant neoplasm of breast: Secondary | ICD-10-CM

## 2011-08-13 DIAGNOSIS — E785 Hyperlipidemia, unspecified: Secondary | ICD-10-CM

## 2011-08-13 MED ORDER — TETANUS-DIPHTH-ACELL PERTUSSIS 5-2.5-18.5 LF-MCG/0.5 IM SUSP
0.5000 mL | Freq: Once | INTRAMUSCULAR | Status: AC
  Administered 2011-08-13: 0.5 mL via INTRAMUSCULAR

## 2011-08-13 MED ORDER — ATORVASTATIN CALCIUM 20 MG PO TABS: 20.0000 mg | ORAL_TABLET | Freq: Every day | ORAL | Status: DC

## 2011-08-13 MED ORDER — FLUTICASONE PROPIONATE 50 MCG/ACT NA SUSP: 2.0000 | Freq: Every day | NASAL | Status: DC

## 2011-08-13 MED ORDER — PHENTERMINE HCL 37.5 MG PO CAPS: 37.5000 mg | ORAL_CAPSULE | ORAL | Status: DC

## 2011-08-13 NOTE — Patient Instructions (Signed)
Follow up in 6 weeks to recheck blood pressure and weight loss progress Someone will call you with your GI, Urology, Mammo appts Start the Phentermine daily in the morning Goal is to exercise 5x/week for at least 30 minutes Try and make healthy food choices Set up a nutritionist appt at [facility] Call your endo at [facility] about your hormonal symptoms Start the Flonase daily- in addition to your Zyrtec Call with any questions or concerns Hang in there!

## 2011-08-13 NOTE — Progress Notes (Signed)
  Subjective:    Patient ID: Allison Reyes, female    DOB: [birth year], 51 y.o.   MRN: [id number]  HPI Cough- reports 'it just won't go away'.  Treated for sinus infxn.  Taking Zyrtec daily.  Cough is productive.  Feels she has a fever b/c 'normal body temp is 96.8'.  Obesity- 'i need to lose 70 lbs.  i can't do it on my own'.  Has not seen nutritionist, not exercising regularly.  Has 2.5 hr commute daily, reports she can't find time.  GERD- reports sxs are severe, on Nexium.  Reports sxs will return if 'i'm even 4 hrs late taking it'.  Has not seen GI recently or had endoscopy recently.  Hyperlipidemia- total cholesterol and LDL are both elevated.  Incontinence- wearing pad daily b/c she will have leakage w/ laughing, coughing, sneezing.  Has never seen urology.  Decreased libido, fatigue, hot flashes.  Had surgical menopause 9 yrs ago after TAH-BSO.  Has not been on hormones for 9 yrs.   Review of Systems For ROS see HPI     Objective:   Physical Exam  Vitals reviewed. Constitutional: She is oriented to person, place, and time. She appears well-developed and well-nourished. No distress.       overweight  HENT:  Head: Normocephalic and atraumatic.  Right Ear: Tympanic membrane normal.  Left Ear: Tympanic membrane normal.  Nose: Mucosal edema and rhinorrhea present. Right sinus exhibits no maxillary sinus tenderness and no frontal sinus tenderness. Left sinus exhibits no maxillary sinus tenderness and no frontal sinus tenderness.  Mouth/Throat: Mucous membranes are normal. Posterior oropharyngeal erythema (w/ PND) present.  Eyes: Conjunctivae and EOM are normal. Pupils are equal, round, and reactive to light.  Neck: Normal range of motion. Neck supple.  Cardiovascular: Normal rate, regular rhythm and normal heart sounds.   Pulmonary/Chest: Effort normal and breath sounds normal. No respiratory distress. She has no wheezes. She has no rales.  Abdominal: Soft. Bowel sounds are  normal. She exhibits no distension. There is no tenderness. There is no rebound and no guarding.  Musculoskeletal: She exhibits no edema.  Lymphadenopathy:    She has no cervical adenopathy.  Neurological: She is alert and oriented to person, place, and time. No cranial nerve deficit. Coordination normal.  Skin: Skin is warm and dry.  Psychiatric:       Anxious, upset          Assessment & Plan:

## 2011-08-14 LAB — PTH, INTACT AND CALCIUM
Calcium, Total (PTH): 10.3 mg/dL (ref 8.4–10.5)
PTH: 197.5 pg/mL — ABNORMAL HIGH (ref 14.0–72.0)

## 2011-08-16 MED ORDER — LEVOTHYROXINE SODIUM 100 MCG PO TABS: 100.0000 ug | ORAL_TABLET | Freq: Every day | ORAL | Status: DC

## 2011-08-16 NOTE — Telephone Encounter (Signed)
Per Dr Tabori 

## 2011-08-16 NOTE — Telephone Encounter (Signed)
Ok for #90, 1 refill 

## 2011-08-16 NOTE — Telephone Encounter (Signed)
Addended by: Derry Lory A on: 08/16/2011 05:46 PM   Modules accepted: Orders

## 2011-08-16 NOTE — Telephone Encounter (Signed)
rx sent to pharmacy by e-script  

## 2011-08-19 MED ORDER — LEVOTHYROXINE SODIUM 100 MCG PO TABS: 100.0000 ug | ORAL_TABLET | Freq: Every day | ORAL | Status: DC

## 2011-08-19 NOTE — Telephone Encounter (Signed)
Addended by: Derry Lory A on: 08/19/2011 05:16 PM   Modules accepted: Orders

## 2011-08-19 NOTE — Telephone Encounter (Signed)
rx sent to pharmacy by e-script  

## 2011-08-21 ENCOUNTER — Encounter: Payer: Self-pay | Admitting: *Deleted

## 2011-08-23 ENCOUNTER — Ambulatory Visit
Admission: RE | Admit: 2011-08-23 | Discharge: 2011-08-23 | Disposition: A | Discharge status: Home / Self Care | Payer: BC Managed Care – PPO | Source: Ambulatory Visit | Attending: Family Medicine | Admitting: Family Medicine

## 2011-08-23 DIAGNOSIS — Z780 Asymptomatic menopausal state: Secondary | ICD-10-CM

## 2011-08-23 DIAGNOSIS — Z1231 Encounter for screening mammogram for malignant neoplasm of breast: Secondary | ICD-10-CM

## 2011-08-26 ENCOUNTER — Encounter: Payer: Self-pay | Admitting: *Deleted

## 2011-08-27 NOTE — Assessment & Plan Note (Signed)
New.  Likely due to both PND and GERD.  Add nasal steroid spray in addition to Zyrtec.  Will refer to GI for better management of GERD.  Reviewed supportive care and red flags that should prompt return.  Pt expressed understanding and is in agreement w/ plan.

## 2011-08-27 NOTE — Assessment & Plan Note (Signed)
New.  Pt continues to gain weight.  Stressed importance of healthy diet and regular exercise.  Will start phentermine to try and jump start metabolism but stressed that this is a short term medication.  Will follow closely.

## 2011-08-27 NOTE — Assessment & Plan Note (Signed)
New.  Start lipitor to get pt to goal.  Will attempt to wean meds as pt loses weight

## 2011-08-27 NOTE — Assessment & Plan Note (Signed)
Chronic problem for pt.  Has endo at [facility] but has not seen recently.  Encouraged her to re-establish care.  Pt in agreement.

## 2011-08-27 NOTE — Assessment & Plan Note (Signed)
Deteriorated.  Pt continues to have sxs despite PPI.  Refer to GI for further evaluation and tx.

## 2011-08-27 NOTE — Assessment & Plan Note (Signed)
New.  Refer to urology for complete evaluation and tx. 

## 2011-08-29 ENCOUNTER — Encounter: Payer: Self-pay | Admitting: Family Medicine

## 2011-09-09 ENCOUNTER — Ambulatory Visit: Payer: BC Managed Care – PPO | Admitting: Gastroenterology

## 2011-09-24 ENCOUNTER — Ambulatory Visit: Payer: BC Managed Care – PPO | Admitting: Family Medicine

## 2011-09-26 ENCOUNTER — Telehealth: Payer: Self-pay | Admitting: *Deleted

## 2011-09-26 NOTE — Telephone Encounter (Signed)
Called pt to advise recent bone density test noted she now has Osteopenia and needs to take 1200mg  of Calcium and 800mg  of Vit D daily, MD Beverely Low will re-check again in 2 years, left vm to call office

## 2011-10-01 ENCOUNTER — Other Ambulatory Visit: Payer: Self-pay | Admitting: Family Medicine

## 2011-10-01 MED ORDER — ESOMEPRAZOLE MAGNESIUM 40 MG PO CPDR: 40.0000 mg | DELAYED_RELEASE_CAPSULE | Freq: Every day | ORAL | Status: DC

## 2011-10-01 NOTE — Telephone Encounter (Signed)
refill nexium caps 40mg  Qty 90 Take one capsule daily before breakfast Last wrt. 11.25.12 Last ov 4.16.13

## 2011-10-01 NOTE — Telephone Encounter (Signed)
rx sent to pharmacy by e-script  

## 2011-10-02 ENCOUNTER — Encounter: Payer: Self-pay | Admitting: Family Medicine

## 2011-10-04 ENCOUNTER — Encounter (INDEPENDENT_AMBULATORY_CARE_PROVIDER_SITE_OTHER): Payer: BC Managed Care – PPO | Admitting: Family Medicine

## 2011-10-04 ENCOUNTER — Telehealth: Payer: Self-pay | Admitting: *Deleted

## 2011-10-04 NOTE — Telephone Encounter (Signed)
Called pt to check on her to see if she is ok, per had to leave before her apt due to emergency, pt noted that she is ok and thanks for calling and will call to reschedule her apt next week, MD Beverely Low made aware verbally

## 2011-10-04 NOTE — Progress Notes (Signed)
This encounter was created in error - please disregard.

## 2011-10-18 NOTE — Telephone Encounter (Signed)
Pt noted that she is currently seeing an Endo MD at [facility] for hypothyroidism and is unable to take calcuim at the times per his instructions however once her treatment is over with Endo she will start taking the calcium, advised MD Beverely Low verbally

## 2011-12-05 ENCOUNTER — Encounter: Payer: Self-pay | Admitting: Family Medicine

## 2011-12-05 ENCOUNTER — Ambulatory Visit (INDEPENDENT_AMBULATORY_CARE_PROVIDER_SITE_OTHER): Payer: BC Managed Care – PPO | Admitting: Family Medicine

## 2011-12-05 VITALS — BP 112/84 | HR 84 | Temp 97.5°F | Ht 63.0 in | Wt 200.8 lb

## 2011-12-05 DIAGNOSIS — J019 Acute sinusitis, unspecified: Secondary | ICD-10-CM

## 2011-12-05 DIAGNOSIS — N390 Urinary tract infection, site not specified: Secondary | ICD-10-CM

## 2011-12-05 LAB — POCT URINALYSIS DIPSTICK
Bilirubin, UA: NEGATIVE
Glucose, UA: NEGATIVE
Ketones, UA: NEGATIVE
Nitrite, UA: POSITIVE
Protein, UA: 30
Spec Grav, UA: >=1.03
Urobilinogen, UA: 0.2
pH, UA: 6

## 2011-12-05 MED ORDER — PHENAZOPYRIDINE HCL 200 MG PO TABS: 200.0000 mg | ORAL_TABLET | Freq: Three times a day (TID) | ORAL | Status: AC | PRN

## 2011-12-05 MED ORDER — SULFAMETHOXAZOLE-TRIMETHOPRIM 800-160 MG PO TABS: 1.0000 | ORAL_TABLET | Freq: Two times a day (BID) | ORAL | Status: DC

## 2011-12-05 NOTE — Assessment & Plan Note (Signed)
Pt w/ L maxillary sinusitis.  Start bactrim to cover both sinus and UTI.  Reviewed supportive care and red flags that should prompt return.  Pt expressed understanding and is in agreement w/ plan.

## 2011-12-05 NOTE — Progress Notes (Signed)
  Subjective:    Patient ID: Allison Reyes, female    DOB: [birth year], 52 y.o.   MRN: [id number]  HPI Yesterday developed abd pain and diarrhea.  Also had urinary frequency and dysuria.  + low back pain.  + chills and sweats, 'i don't get fevers'.  + vomiting x1 yesterday.  'i'm exhausted'.  + facial pain over L maxillary.  Green nasal drainage, ear pain.  No cough.  No known sick contacts.   Review of Systems For ROS see HPI     Objective:   Physical Exam  Constitutional: She appears well-developed and well-nourished. No distress.  HENT:  Head: Normocephalic and atraumatic.  Right Ear: Tympanic membrane normal.  Left Ear: Tympanic membrane normal.  Nose: Mucosal edema and rhinorrhea present. Right sinus exhibits no maxillary sinus tenderness and no frontal sinus tenderness. Left sinus exhibits maxillary sinus tenderness. Left sinus exhibits no frontal sinus tenderness.  Mouth/Throat: Uvula is midline and mucous membranes are normal. Posterior oropharyngeal erythema present. No oropharyngeal exudate.  Eyes: Conjunctivae and EOM are normal. Pupils are equal, round, and reactive to light.  Neck: Normal range of motion. Neck supple.  Cardiovascular: Normal rate, regular rhythm and normal heart sounds.   Pulmonary/Chest: Effort normal and breath sounds normal. No respiratory distress. She has no wheezes.  Abdominal: Soft. She exhibits no distension. There is no tenderness.  Lymphadenopathy:    She has no cervical adenopathy.          Assessment & Plan:

## 2011-12-05 NOTE — Assessment & Plan Note (Signed)
New.  Pt's sxs and PE consistent w/ infxn.  Start Bactrim.  Pyridium for pain relief.  Increase fluid intake.  Reviewed supportive care and red flags that should prompt return.  Pt expressed understanding and is in agreement w/ plan.

## 2011-12-05 NOTE — Patient Instructions (Addendum)
This is a sinus and bladder infection Start the Bactrim tonight w/ dinner Start the pyridium for pain relief while urinating LOTS OF WATER! REST! Hang in there!!!

## 2011-12-07 LAB — URINE CULTURE: Colony Count: >=100000

## 2011-12-09 MED ORDER — CEFUROXIME AXETIL 500 MG PO TABS: 500.0000 mg | ORAL_TABLET | Freq: Two times a day (BID) | ORAL | Status: AC

## 2011-12-09 NOTE — Addendum Note (Signed)
Addended by: Derry Lory A on: 12/09/2011 05:16 PM   Modules accepted: Orders

## 2012-02-21 ENCOUNTER — Other Ambulatory Visit: Payer: Self-pay | Admitting: Family Medicine

## 2012-02-21 MED ORDER — ESOMEPRAZOLE MAGNESIUM 40 MG PO CPDR: 40.0000 mg | DELAYED_RELEASE_CAPSULE | Freq: Every day | ORAL | Status: DC

## 2012-02-21 MED ORDER — ROPINIROLE HCL 1 MG PO TABS: 1.0000 mg | ORAL_TABLET | Freq: Every day | ORAL | Status: DC

## 2012-02-21 NOTE — Telephone Encounter (Signed)
refill x 2-last ov 8.8.13 acute--last labs done 4.9.13  1-Nexium caps 40MG  #90 take 1-capsule daily before breakfast--last wrt 6.4.13 #90  2-Ropinirole HCL Tabs 1MG  #90 wt/3-refills --Take 1 tablet at bedtime for restless legs

## 2012-02-21 NOTE — Telephone Encounter (Signed)
rx sent to pharmacy by e-script  

## 2012-02-27 ENCOUNTER — Encounter: Payer: Self-pay | Admitting: Family Medicine

## 2012-02-27 ENCOUNTER — Ambulatory Visit (INDEPENDENT_AMBULATORY_CARE_PROVIDER_SITE_OTHER): Payer: BC Managed Care – PPO | Admitting: Family Medicine

## 2012-02-27 VITALS — BP 104/68 | HR 75 | Temp 97.9°F | Ht 62.3 in | Wt 201.4 lb

## 2012-02-27 DIAGNOSIS — J019 Acute sinusitis, unspecified: Secondary | ICD-10-CM

## 2012-02-27 MED ORDER — ZOLPIDEM TARTRATE 10 MG PO TABS: 10.0000 mg | ORAL_TABLET | Freq: Every evening | ORAL | Status: DC | PRN

## 2012-02-27 MED ORDER — CLARITHROMYCIN ER 500 MG PO TB24: 1000.0000 mg | ORAL_TABLET | Freq: Every day | ORAL | Status: DC

## 2012-02-27 NOTE — Assessment & Plan Note (Signed)
Pt's sxs and PE consistent w/ infxn.  Start abx.  Offered cough syrup- pt reports she has some remaining.  Reviewed supportive care and red flags that should prompt return.  Pt expressed understanding and is in agreement w/ plan.

## 2012-02-27 NOTE — Patient Instructions (Addendum)
This is a sinus infection/bronchitis combo Start the Biaxin- 2 tabs at the same time daily w/ food Use the cough syrup as needed REST! Plenty of fluids Call with any questions or concerns Hang in there!!!

## 2012-02-27 NOTE — Progress Notes (Signed)
  Subjective:    Patient ID: Allison Reyes, female    DOB: 1960-12-20, 51 y.o.   MRN: [id number]  HPI ? Sinusitis- sxs started 10 days ago w/ nasal congestion.  Hx of seasonal allergies.  + sick contacts.  + facial pain.  + cough- 'so bad it gives me super bad headaches'.  Cough is productive of 'colorful mucous'.  + ear pain, R>L.  Sore throat.  Subjective fevers.   Review of Systems For ROS see HPI     Objective:   Physical Exam  Vitals reviewed. Constitutional: She appears well-developed and well-nourished. No distress.  HENT:  Head: Normocephalic and atraumatic.  Right Ear: Tympanic membrane normal.  Left Ear: Tympanic membrane normal.  Nose: Mucosal edema and rhinorrhea present. Right sinus exhibits maxillary sinus tenderness. Right sinus exhibits no frontal sinus tenderness. Left sinus exhibits maxillary sinus tenderness. Left sinus exhibits no frontal sinus tenderness.  Mouth/Throat: Uvula is midline and mucous membranes are normal. Posterior oropharyngeal erythema present. No oropharyngeal exudate.  Eyes: Conjunctivae normal and EOM are normal. Pupils are equal, round, and reactive to light.  Neck: Normal range of motion. Neck supple.  Cardiovascular: Normal rate, regular rhythm and normal heart sounds.   Pulmonary/Chest: Effort normal. No respiratory distress. She has no wheezes.       Coarse BS on R w/out focal rales/consolidation  Lymphadenopathy:    She has no cervical adenopathy.          Assessment & Plan:

## 2012-03-03 ENCOUNTER — Telehealth: Payer: Self-pay | Admitting: Family Medicine

## 2012-03-03 ENCOUNTER — Ambulatory Visit (INDEPENDENT_AMBULATORY_CARE_PROVIDER_SITE_OTHER): Payer: BC Managed Care – PPO | Admitting: Family Medicine

## 2012-03-03 ENCOUNTER — Encounter: Payer: Self-pay | Admitting: Family Medicine

## 2012-03-03 VITALS — BP 138/86 | HR 63 | Temp 97.5°F | Wt 199.2 lb

## 2012-03-03 DIAGNOSIS — J329 Chronic sinusitis, unspecified: Secondary | ICD-10-CM

## 2012-03-03 DIAGNOSIS — I10 Essential (primary) hypertension: Secondary | ICD-10-CM

## 2012-03-03 DIAGNOSIS — R42 Dizziness and giddiness: Secondary | ICD-10-CM

## 2012-03-03 MED ORDER — METHYLPREDNISOLONE ACETATE 80 MG/ML IJ SUSP
80.0000 mg | Freq: Once | INTRAMUSCULAR | Status: AC
  Administered 2012-03-03: 80 mg via INTRAMUSCULAR

## 2012-03-03 MED ORDER — MOXIFLOXACIN HCL 400 MG PO TABS: 400.0000 mg | ORAL_TABLET | Freq: Every day | ORAL | Status: DC

## 2012-03-03 MED ORDER — PREDNISONE 10 MG PO TABS: ORAL_TABLET | ORAL | Status: DC

## 2012-03-03 MED ORDER — CEFTRIAXONE SODIUM 1 G IJ SOLR
1.0000 g | Freq: Once | INTRAMUSCULAR | Status: AC
  Administered 2012-03-03: 1 g via INTRAMUSCULAR

## 2012-03-03 NOTE — Telephone Encounter (Signed)
Caller: Allison Reyes/Patient; Patient Name: Allison Reyes; PCP: Sheliah Hatch.; Best Callback Phone Number: [phone] Calling regarding dizziness, head is throbbing, ears sore, teeth hurt, SOB and diarrhea/abdominal pain. Onset of symptoms 03/01/12. Was in the office 02/27/12 and diagnosed with sinus infection/bronchitis, taking Biaxin. Not feeling any better and has 4 days left of antibiotic. States the dizziness is the worst symptom. Has had diarrhea since 03/01/12, after she eats she has loose bowel movement, liquid stools at this time, last voided 03/03/12 this am unsure per caller. Afebrile. Emergent signs and symptoms ruled out as per Dizziness or Vertigo protocol except for see in 24 hours due to symptoms worsen with movement of head, appt scheduled for 4 pm, Dr.Lowne and call back parameters given.

## 2012-03-03 NOTE — Progress Notes (Signed)
  Subjective:     Allison Reyes is a 51 y.o. female who presents for evaluation of sinus pain. Symptoms include: congestion, facial pain, headaches, nasal congestion, sinus pressure and tooth pain. Onset of symptoms was several weeks ago. Symptoms have been gradually worsening since that time. Past history is significant for no history of pneumonia or bronchitis-- pt on tx for bronchitis/ sinusits with biaxin.  Patient is a non-smoker.  The following portions of the patient's history were reviewed and updated as appropriate: allergies, current medications, past family history, past medical history, past social history, past surgical history and problem list.  Review of Systems Pertinent items are noted in HPI.   Objective:    BP 138/86  Pulse 63  Temp 97.5 F (36.4 C) (Oral)  Wt 199 lb 3.2 oz (90.357 kg)  SpO2 98% General appearance: alert, cooperative, appears stated age and mild distress Ears: normal TM's and external ear canals both ears Nose: green discharge, moderate congestion, turbinates red, swollen, sinus tenderness right Throat: lips, mucosa, and tongue normal; teeth and gums normal Neck: mild anterior cervical adenopathy, no adenopathy, supple, symmetrical, trachea midline and thyroid not enlarged, symmetric, no tenderness/mass/nodules Lungs: clear to auscultation bilaterally Heart: S1, S2 normal    Assessment:    Acute bacterial sinusitis.    Plan:    Nasal steroids per medication orders. Antihistamines per medication orders. avelox per medication orders. Sinus CT scan ordered. f/u prn

## 2012-03-03 NOTE — Patient Instructions (Addendum)

## 2012-03-03 NOTE — Addendum Note (Signed)
Addended by: Arnette Norris on: 03/03/2012 05:50 PM   Modules accepted: Orders

## 2012-03-04 ENCOUNTER — Ambulatory Visit (HOSPITAL_BASED_OUTPATIENT_CLINIC_OR_DEPARTMENT_OTHER)
Admission: RE | Admit: 2012-03-04 | Discharge: 2012-03-04 | Disposition: A | Discharge status: Home / Self Care | Payer: BC Managed Care – PPO | Source: Ambulatory Visit | Attending: Family Medicine | Admitting: Family Medicine

## 2012-03-04 DIAGNOSIS — J323 Chronic sphenoidal sinusitis: Secondary | ICD-10-CM | POA: Insufficient documentation

## 2012-03-04 DIAGNOSIS — J329 Chronic sinusitis, unspecified: Secondary | ICD-10-CM

## 2012-03-10 ENCOUNTER — Telehealth: Payer: Self-pay | Admitting: Family Medicine

## 2012-03-10 MED ORDER — ESOMEPRAZOLE MAGNESIUM 40 MG PO CPDR: 40.0000 mg | DELAYED_RELEASE_CAPSULE | Freq: Every day | ORAL | Status: DC

## 2012-03-10 NOTE — Telephone Encounter (Signed)
Rx sent 

## 2012-03-10 NOTE — Telephone Encounter (Signed)
Refill: Nexium 40 mg caps. 90 day supply

## 2012-06-03 ENCOUNTER — Ambulatory Visit (INDEPENDENT_AMBULATORY_CARE_PROVIDER_SITE_OTHER): Payer: BC Managed Care – PPO | Admitting: Internal Medicine

## 2012-06-03 ENCOUNTER — Telehealth: Payer: Self-pay | Admitting: Family Medicine

## 2012-06-03 VITALS — BP 112/74 | HR 62 | Temp 97.8°F | Wt 203.0 lb

## 2012-06-03 DIAGNOSIS — N390 Urinary tract infection, site not specified: Secondary | ICD-10-CM

## 2012-06-03 LAB — POCT URINALYSIS DIPSTICK
Bilirubin, UA: NEGATIVE
Glucose, UA: NEGATIVE
Ketones, UA: NEGATIVE
Leukocytes, UA: NEGATIVE
Nitrite, UA: NEGATIVE
Protein, UA: 100
Spec Grav, UA: 1.01
Urobilinogen, UA: 0.2
pH, UA: 7

## 2012-06-03 MED ORDER — CIPROFLOXACIN HCL 500 MG PO TABS: 500.0000 mg | ORAL_TABLET | Freq: Two times a day (BID) | ORAL | Status: DC

## 2012-06-03 NOTE — Telephone Encounter (Signed)
Patient Information:  Caller Name: Laketra  Phone: [phone]  Patient: Allison, Reyes  Gender: Female  DOB: [birth year]  Age: 52 Years  PCP: Sheliah Hatch  Pregnant: No  Office Follow Up:  Does the office need to follow up with this patient?: No  Instructions For The Office: N/A   Symptoms  Reason For Call & Symptoms: I think I have a UTI.  Pt reports pressure, discomfort with urination.  Reviewed Health History In EMR: Yes  Reviewed Medications In EMR: Yes  Reviewed Allergies In EMR: Yes  Reviewed Surgeries / Procedures: Yes  Date of Onset of Symptoms: 06/02/2012 OB / GYN:  LMP: Unknown  Guideline(s) Used:  Urination Pain - Female  Disposition Per Guideline:   See Today in Office  Reason For Disposition Reached:   Age > 50 years  Advice Given:  Call Back If:  You become worse.  Appointment Scheduled:  06/03/2012 14:15:00 Appointment Scheduled Provider:  Willow Ora  (no appt available with Dr Beverely Low; all appts were full)

## 2012-06-03 NOTE — Patient Instructions (Addendum)
Drink plenty of fluids, ciprofloxacin twice a day for one week. Please call if you develop more pain, fever, chills, nausea or if not improving in the next few days. Please come back in 3 weeks just for labs: We'll get a sample for a urine culture (dx UTI) --- Please see your primary Dr. for diarrhea is not improving soon.

## 2012-06-03 NOTE — Progress Notes (Signed)
  Subjective:    Patient ID: Allison Reyes, female    DOB: 18-May-1960, 52 y.o.   MRN: [id number]  HPI Acute visit One-day history of urinary urgency, suprapubic pressure, dysuria.  Past Medical History  Diagnosis Date  . Depression   . Hyperparathyroidism   . Osteopenia    Past Surgical History  Procedure Date  . Thyroid removed     1/2  . Laparoscopic endometriosis fulguration   . Abdominal hysterectomy   . Oophorectomy   . Nasal sinus surgery 01/03/11    Review of Systems Denies nausea, vomiting but admits to diarrhea for the last 4 weeks, diarrhea is not every day, no blood in the stools. Some subjective fever for one day, occasional chills. No vaginal discharge, vaginal rash. Appetite is normal. No gross hematuria. Some pain in the lower back and sides     Objective:   Physical Exam General -- alert, well-developed, NAD, afebrile.    Abdomen--not distended, soft, slightly tender mostly at the lower abdomen, L>R, no mass or rebound. Question of CVA tenderness on the left..   Extremities-- no pretibial edema bilaterally  Neurologic-- alert & oriented X3 and strength normal in all extremities. Psych-- Cognition and judgment appear intact. Alert and cooperative with normal attention span and concentration.  not anxious appearing and not depressed appearing.      Assessment & Plan:  UTI, Urinary symptoms for one day, Udip c/w UTI; the patient is tender in the lower abdomen, L>R, no mass or rebound ;ther than suprapubic pressure, she has not noticed abdominal pain. Reports she has a lot of "scar tissue" in the lower abdomen and thinks pain is related to that. Plan: Cipro, UCX Definitely call if she has more pain, nausea, lack of appetite or fever -------> may need a CT to rule out kidney stones or appendicitis See instructions  Also, having diarrhea for 4 weeks, recommend to see PCP if not improving.

## 2012-06-04 ENCOUNTER — Encounter: Payer: Self-pay | Admitting: Internal Medicine

## 2012-06-06 LAB — URINE CULTURE: Colony Count: >=100000

## 2012-06-13 ENCOUNTER — Other Ambulatory Visit: Payer: Self-pay | Admitting: Family Medicine

## 2012-06-13 NOTE — Telephone Encounter (Signed)
Rx sent to the pharmacy([facility]) by e-script.//AB/CMA

## 2012-06-24 ENCOUNTER — Encounter: Payer: Self-pay | Admitting: *Deleted

## 2012-09-29 ENCOUNTER — Other Ambulatory Visit: Payer: Self-pay | Admitting: Family Medicine

## 2012-09-29 NOTE — Telephone Encounter (Signed)
Med filled.  

## 2013-01-25 ENCOUNTER — Ambulatory Visit (INDEPENDENT_AMBULATORY_CARE_PROVIDER_SITE_OTHER): Payer: BC Managed Care – PPO | Admitting: Family Medicine

## 2013-01-25 ENCOUNTER — Encounter: Payer: Self-pay | Admitting: Family Medicine

## 2013-01-25 VITALS — BP 118/78 | HR 79 | Temp 98.3°F | Wt 216.2 lb

## 2013-01-25 DIAGNOSIS — J209 Acute bronchitis, unspecified: Secondary | ICD-10-CM

## 2013-01-25 DIAGNOSIS — N390 Urinary tract infection, site not specified: Secondary | ICD-10-CM

## 2013-01-25 DIAGNOSIS — J019 Acute sinusitis, unspecified: Secondary | ICD-10-CM

## 2013-01-25 LAB — POCT URINALYSIS DIPSTICK
Bilirubin, UA: NEGATIVE
Glucose, UA: NEGATIVE
Ketones, UA: NEGATIVE
Leukocytes, UA: NEGATIVE
Nitrite, UA: NEGATIVE
Protein, UA: NEGATIVE
Spec Grav, UA: 1.025
Urobilinogen, UA: 0.2
pH, UA: 5

## 2013-01-25 MED ORDER — PROMETHAZINE-DM 6.25-15 MG/5ML PO SYRP: 5.0000 mL | ORAL_SOLUTION | Freq: Four times a day (QID) | ORAL | Status: DC | PRN

## 2013-01-25 MED ORDER — ALBUTEROL SULFATE HFA 108 (90 BASE) MCG/ACT IN AERS: 2.0000 | INHALATION_SPRAY | RESPIRATORY_TRACT | Status: DC | PRN

## 2013-01-25 MED ORDER — SULFAMETHOXAZOLE-TRIMETHOPRIM 800-160 MG PO TABS: 1.0000 | ORAL_TABLET | Freq: Two times a day (BID) | ORAL | Status: AC

## 2013-01-25 NOTE — Assessment & Plan Note (Signed)
Pt's sxs and PE consistent w/ infxn.  Start abx.  Reviewed supportive care and red flags that should prompt return.  Pt expressed understanding and is in agreement w/ plan.  

## 2013-01-25 NOTE — Progress Notes (Signed)
  Subjective:    Patient ID: Allison Reyes, female    DOB: [birth year], 52 y.o.   MRN: [id number]  HPI URI- sxs started Wednesday w/ dry cough.  Subsequently 'just felt badly'.  Cough worsened, productive of yellow sputum.  Chest congestion and discomfort.  + SOB.  + facial pain/pressure.  Bilateral ear fullness.  + sick contacts.  No N/V/D.  + sweats and chills- no documented fever.  UTI- suprapubic cramping, intermittent 'for a couple weeks'.  Decreased water intake.  Increased urination due to cough.  Some hesitancy.  No urgency.   Review of Systems For ROS see HPI     Objective:   Physical Exam  Vitals reviewed. Constitutional: She appears well-developed and well-nourished. No distress.  HENT:  Head: Normocephalic and atraumatic.  + TTP over frontal and maxillary sinuses TMs normal bilaterally Mild nasal congestion Throat w/out erythema, edema, or exudate  Eyes: Conjunctivae and EOM are normal. Pupils are equal, round, and reactive to light.  Neck: Normal range of motion. Neck supple.  Cardiovascular: Normal rate, regular rhythm, normal heart sounds and intact distal pulses.   No murmur heard. Pulmonary/Chest: Effort normal. No respiratory distress. She has wheezes (diffuse expiratory wheezes).  + hacking cough + SOB  Lymphadenopathy:    She has no cervical adenopathy.          Assessment & Plan:

## 2013-01-25 NOTE — Assessment & Plan Note (Signed)
Pt's sxs and UA consistent w/ infxn.  Start abx to double cover sinuses and UTI.  Will await cx results.

## 2013-01-25 NOTE — Assessment & Plan Note (Signed)
New.  Wheezing improved s/p neb tx in office.  No hx of asthma.  Start abx.  Albuterol HFA PRN.  Cough syrup PRN.  Reviewed supportive care and red flags that should prompt return.  Pt expressed understanding and is in agreement w/ plan.

## 2013-01-25 NOTE — Patient Instructions (Addendum)
This is a sinus infection and bronchitis Start the Bactrim which will cover both upper respiratory infection and possible UTI Drink plenty of fluids REST! Use the cough syrup as needed Hang in there!

## 2013-01-26 LAB — URINE CULTURE: Colony Count: >=100000

## 2013-02-16 ENCOUNTER — Other Ambulatory Visit: Payer: Self-pay | Admitting: Family Medicine

## 2013-02-16 NOTE — Telephone Encounter (Signed)
Med filled.  

## 2013-04-30 ENCOUNTER — Encounter (HOSPITAL_BASED_OUTPATIENT_CLINIC_OR_DEPARTMENT_OTHER): Payer: Self-pay | Admitting: Emergency Medicine

## 2013-04-30 ENCOUNTER — Emergency Department (HOSPITAL_BASED_OUTPATIENT_CLINIC_OR_DEPARTMENT_OTHER)
Admission: EM | Admit: 2013-04-30 | Discharge: 2013-04-30 | Disposition: A | Discharge status: Home / Self Care | Payer: BC Managed Care – PPO | Attending: Emergency Medicine | Admitting: Emergency Medicine

## 2013-04-30 DIAGNOSIS — M899 Disorder of bone, unspecified: Secondary | ICD-10-CM | POA: Insufficient documentation

## 2013-04-30 DIAGNOSIS — F329 Major depressive disorder, single episode, unspecified: Secondary | ICD-10-CM | POA: Insufficient documentation

## 2013-04-30 DIAGNOSIS — M949 Disorder of cartilage, unspecified: Secondary | ICD-10-CM

## 2013-04-30 DIAGNOSIS — E213 Hyperparathyroidism, unspecified: Secondary | ICD-10-CM | POA: Insufficient documentation

## 2013-04-30 DIAGNOSIS — F3289 Other specified depressive episodes: Secondary | ICD-10-CM | POA: Insufficient documentation

## 2013-04-30 DIAGNOSIS — Z79899 Other long term (current) drug therapy: Secondary | ICD-10-CM | POA: Insufficient documentation

## 2013-04-30 DIAGNOSIS — F17200 Nicotine dependence, unspecified, uncomplicated: Secondary | ICD-10-CM | POA: Insufficient documentation

## 2013-04-30 DIAGNOSIS — R209 Unspecified disturbances of skin sensation: Secondary | ICD-10-CM | POA: Insufficient documentation

## 2013-04-30 DIAGNOSIS — R202 Paresthesia of skin: Secondary | ICD-10-CM

## 2013-04-30 LAB — CBC
HCT: 39.8 % (ref 36.0–46.0)
Hemoglobin: 13.5 g/dL (ref 12.0–15.0)
MCH: 31.8 pg (ref 26.0–34.0)
MCHC: 33.9 g/dL (ref 30.0–36.0)
MCV: 93.6 fL (ref 78.0–100.0)
Platelets: 244 10*3/uL (ref 150–400)
RBC: 4.25 MIL/uL (ref 3.87–5.11)
RDW: 12.2 % (ref 11.5–15.5)
WBC: 6.7 10*3/uL (ref 4.0–10.5)

## 2013-04-30 LAB — BASIC METABOLIC PANEL
BUN: 16 mg/dL (ref 6–23)
CO2: 26 mEq/L (ref 19–32)
Calcium: 8.2 mg/dL — ABNORMAL LOW (ref 8.4–10.5)
Chloride: 100 mEq/L (ref 96–112)
Creatinine, Ser: 0.9 mg/dL (ref 0.50–1.10)
GFR calc Af Amer: 84 mL/min — ABNORMAL LOW (ref >90)
GFR calc non Af Amer: 72 mL/min — ABNORMAL LOW (ref >90)
Glucose, Bld: 110 mg/dL — ABNORMAL HIGH (ref 70–99)
Potassium: 4.1 mEq/L (ref 3.7–5.3)
Sodium: 142 mEq/L (ref 137–147)

## 2013-04-30 LAB — T4, FREE: Free T4: 1.24 ng/dL (ref 0.80–1.80)

## 2013-04-30 LAB — T3, FREE: T3, Free: 2.9 pg/mL (ref 2.3–4.2)

## 2013-04-30 LAB — MAGNESIUM: Magnesium: 1.8 mg/dL (ref 1.5–2.5)

## 2013-04-30 LAB — TSH: TSH: 3.11 u[IU]/mL (ref 0.350–4.500)

## 2013-04-30 MED ORDER — CALCITRIOL 0.5 MCG PO CAPS
0.5000 ug | ORAL_CAPSULE | Freq: Every day | ORAL | Status: DC
  Filled 2013-04-30: qty 1

## 2013-04-30 MED ORDER — SODIUM CHLORIDE 0.9 % IV SOLN
1.0000 g | Freq: Once | INTRAVENOUS | Status: AC
  Administered 2013-04-30: 1 g via INTRAVENOUS
  Filled 2013-04-30: qty 10

## 2013-04-30 MED ORDER — CALCITRIOL 0.5 MCG PO CAPS: 0.5000 ug | ORAL_CAPSULE | Freq: Two times a day (BID) | ORAL | Status: DC

## 2013-04-30 MED ORDER — SODIUM CHLORIDE 0.9 % IV SOLN: 1.0000 g | Freq: Once | INTRAVENOUS | Status: DC

## 2013-04-30 NOTE — ED Notes (Signed)
Pt. [facility].  Pt. Reports she has been feeling numbness and tingling from feet to face.  Pt. Is in no resp. Distress.  [facility]. [facility].

## 2013-04-30 NOTE — Discharge Instructions (Signed)
Hypocalcemia, Adult Hypocalcemia is low blood calcium. Calcium is important for cells to function in the body. Low blood calcium can cause a variety of symptoms and problems. CAUSES   Low levels of a body protein called albumin.  Problems with the parathyroid glands or surgical removal of the parathyroid glands. The parathyroid glands maintain the body's level of calcium.  Decreased production or improper use of parathyroid hormone.  Lack (deficiency) of vitamin D or magnesium or both.  Intestinal problems that interfere with nutrient absorption.  Alcoholism.  Kidney problems.  Inflammation of the pancreas (pancreatitis).  Certain medicines.  Severe infections (sepsis).  Infiltrative diseases. With these diseases the parathyroid glands are filled with cells or substances that are not normally present. Examples include:  Sarcoidosis.  Hemachromatosis.  Breakdown of large amounts of muscle fiber.  High levels of phosphate in the body.  Cancer.  Massive blood transfusions which usually occur with severe trauma. SYMPTOMS   Numbness and tingling in the fingers, toes, or around the mouth.  Muscle aches or cramps, especially in the legs, feet, and back.  Muscle twitches.  Shortness of breath or wheezing.  Difficulty swallowing.  Changes in the sound of the voice.  General weakness.  Fainting.  Fast heart beats (palpitations).  Chest pain.  Irritability.  Difficulty thinking.  Memory problems or confusion.  Severe fatigue.  Changes in personality.  Depression and anxiety.  Shaking uncontrollably (seizures).  Coarse, brittle hair and nails.  Dry skin or lasting (chronic) skin diseases (psoriasis, eczema, or dermatitis).  Clouding of the eye lens (cataracts).  Abdominal cramping or pain. DIAGNOSIS  Hypocalcemia is usually diagnosed through blood tests that reveal a low level of blood calcium. Other tests, such as a recording of the electrical  activity of the heart (electrocardiogram, EKG), may be performed in order to diagnose the underlying cause of the condition. TREATMENT  Treatment for hypocalcemia includes giving calcium supplements. These can be given by mouth or by intravenous (IV) access tube, depending on the severity of the symptoms and deficiency. Other minerals (electrolytes), such as magnesium, may also be given. HOME CARE INSTRUCTIONS   Meet with a dietitian to make sure you are eating the most healthful diet possible, or follow diet instructions as directed by your caregiver.  Follow up with your caregiver as directed. SEEK IMMEDIATE MEDICAL CARE IF:   You develop chest pain.  You develop persistent rapid or irregular heartbeats.  You have difficulty breathing.  You faint.  You develop increased fatigue.  You have new swelling in the feet, ankles, or legs.  You develop increased muscle twitching.  You start to have seizures.  You develop confusion.  You develop mood, memory, or personality changes. MAKE SURE YOU:   Understand these instructions.  Will watch your condition.  Will get help right away if you are not doing well or get worse. Document Released: 10/03/2009 Document Revised: 07/08/2011 Document Reviewed: 10/03/2009 [facility] Patient Information 2014 [employer] [place].     Please take 2 TUMS tablets 4 times a day. Please start taking calcitriol 0.5 mcg twice daily. Please fill this prescription tonight and take 2 tablets tonight and then one tablet twice a day. Please followup with Dr. Othella BoyerScheri on Monday as scheduled. If you become symptomatic, he recommends calling his office or cell phone immediately.

## 2013-04-30 NOTE — ED Provider Notes (Signed)
TIME SEEN: 3:45 PM  CHIEF COMPLAINT: Tingling to the face, fingers, feet  HPI: Patient is a 53 year old female with recent parathyroidectomy on 04/27/2013 for hyperparathyroidism who presents the emergency department with numbness and tingling that started this morning at 10 AM in her lips, fingertips bilaterally and feet. She states is progressively got worse. Has been spoke with her surgeon, Dr. Mayra Reelandy Scheri, today who told them to come to the emergency department as patient was likely suffering from hypocalcemia.  She denies any fevers or chills. No vomiting. She has had diarrhea. She feels weak all over. Her incision is clean, dry and intact with no drainage. She denies any palpitations or lightheadedness.  ROS: See HPI Constitutional: no fever  Eyes: no drainage  ENT: no runny nose   Cardiovascular:  no chest pain  Resp: no SOB  GI: no vomiting GU: no dysuria Integumentary: no rash  Allergy: no hives  Musculoskeletal: no leg swelling  Neurological: no slurred speech ROS otherwise negative  PAST MEDICAL HISTORY/PAST SURGICAL HISTORY:  Past Medical History  Diagnosis Date  . Depression   . Hyperparathyroidism   . Osteopenia     MEDICATIONS:  Prior to Admission medications   Medication Sig Start Date End Date Taking? Authorizing Provider  oxyCODONE-acetaminophen (PERCOCET) 10-325 MG per tablet Take 1 tablet by mouth every 4 (four) hours as needed for pain.   Yes Historical Provider, MD  albuterol (PROAIR HFA) 108 (90 BASE) MCG/ACT inhaler Inhale 2 puffs into the lungs every 4 (four) hours as needed for wheezing. 01/25/13   Sheliah HatchKatherine E Tabori, MD  cetirizine (ZYRTEC) 10 MG tablet Take 10 mg by mouth daily.      Historical Provider, MD  cinacalcet (SENSIPAR) 30 MG tablet Take 30 mg by mouth daily.    Historical Provider, MD  citalopram (CELEXA) 40 MG tablet TAKE 1 TABLET DAILY 09/29/12   Sheliah HatchKatherine E Tabori, MD  levothyroxine (SYNTHROID, LEVOTHROID) 100 MCG tablet Take 1 tablet (100  mcg total) by mouth daily. 08/19/11   Sheliah HatchKatherine E Tabori, MD  levothyroxine (SYNTHROID, LEVOTHROID) 100 MCG tablet TAKE 1 TABLET DAILY 02/16/13   Sheliah HatchKatherine E Tabori, MD  NEXIUM 40 MG capsule TAKE 1 CAPSULE DAILY BEFORE BREAKFAST 06/13/12   Sheliah HatchKatherine E Tabori, MD  promethazine-dextromethorphan (PROMETHAZINE-DM) 6.25-15 MG/5ML syrup Take 5 mLs by mouth 4 (four) times daily as needed. 01/25/13   Sheliah HatchKatherine E Tabori, MD  zolpidem (AMBIEN) 10 MG tablet Take 1 tablet (10 mg total) by mouth at bedtime as needed. 02/27/12   Sheliah HatchKatherine E Tabori, MD    ALLERGIES:  No Known Allergies  SOCIAL HISTORY:  History  Substance Use Topics  . Smoking status: Light Tobacco Smoker    Types: Cigarettes  . Smokeless tobacco: Not on file     Comment: 7 cigarettes daily.  . Alcohol Use: Yes     Comment: social-wine    FAMILY HISTORY: Family History  Problem Relation Age of Onset  . Coronary artery disease Mother   . Hypertension Father   . Hypertension Mother   . Diabetes Neg Hx   . Stroke Paternal Grandfather   . Colon cancer Neg Hx   . Breast cancer Neg Hx     EXAM: BP 106/67  Pulse 63  Temp(Src) 98.3 F (36.8 C) (Oral)  Resp 18  SpO2 97% CONSTITUTIONAL: Alert and oriented and responds appropriately to questions. Well-appearing; well-nourished HEAD: Normocephalic EYES: Conjunctivae clear, PERRL ENT: normal nose; no rhinorrhea; moist mucous membranes; pharynx without lesions noted; incision to the  anterior neck is clean, dry and intact with no drainage and no warmth or erythema NECK: Supple, no meningismus, no LAD  CARD: RRR; S1 and S2 appreciated; no murmurs, no clicks, no rubs, no gallops RESP: Normal chest excursion without splinting or tachypnea; breath sounds clear and equal bilaterally; no wheezes, no rhonchi, no rales,  ABD/GI: Normal bowel sounds; non-distended; soft, non-tender, no rebound, no guarding BACK:  The back appears normal and is non-tender to palpation, there is no CVA  tenderness EXT: Normal ROM in all joints; non-tender to palpation; no edema; normal capillary refill; no cyanosis    SKIN: Normal color for age and race; warm NEURO: Moves all extremities equally, no facial droop or slurred speech, patient reports sensory deficit in her hands and feet bilaterally and from her upper cheeks to her chin bilaterally, otherwise cranial nerves II through XII intact, no spasticity or clonus on exam PSYCH: The patient's mood and manner are appropriate. Grooming and personal hygiene are appropriate.  MEDICAL DECISION MAKING: Patient here with numbness and tingling in her hands, feet and face bilaterally. I am not concerned for any intracranial abnormality. We'll check patient's electrolytes and discuss with her surgeon at [phone]. EKG is reassuring. Vital signs within normal limits.  ED PROGRESS: Patient's calcium level is 8.2. Discussed with her surgeon, Dr. Othella Boyer.  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  He feels this may be do to a delta change in her calcium and she normally runs from 10-11. He recommends giving 2 g of IV calcium gluconate and seeing if patient has resolution of symptoms. Also recommends giving calcitriol that we do not carry this in this emergency department. If patient is feeling better and is still hemodynamically stable, will discharge home on calcitriol 0.5 mcg twice daily.   6:27 PM  Pt reports feeling almost completely back to baseline after 2 g of IV calcium. Discussed with Dr. Othella Boyer who recommends patient take 2 times tablets 4 times a day and take 2 calcitriol 0.5 mcg tablets tonight then 0.5 mcg twice daily until he sees her on Monday morning. We'll discharge return precautions. Patient and husband at bedside verbalize understanding and are comfortable and pleased with this plan.   EKG Interpretation    Date/Time:  Friday April 30 2013 15:53:34 EST Ventricular Rate:  59 PR Interval:  170 QRS  Duration: 84 QT Interval:  442 QTC Calculation: 437 R Axis:   3 Text Interpretation:  Sinus bradycardia Otherwise normal ECG No significant change since last tracing Confirmed by WARD  DO, KRISTEN ([id number]) on 04/30/2013 4:00:03 PM              Layla Maw Ward, DO 04/30/13 [id number]

## 2013-04-30 NOTE — ED Notes (Signed)
Pt. Husband very pushy and abrupt in triage.  RN explained the process and tried to appease the Pt. And family member with NO success.  When asking Pt. Questions she could make nasty comments that RN could understand even though Pt. Kept teeth clinched together. Explained that these questions were important.  No distress noted in Pt. And VSS.  Pt. Tearful in triage.

## 2013-06-03 ENCOUNTER — Other Ambulatory Visit: Payer: Self-pay | Admitting: Family Medicine

## 2013-06-03 NOTE — Telephone Encounter (Signed)
Med filled.  

## 2013-07-23 ENCOUNTER — Other Ambulatory Visit: Payer: Self-pay | Admitting: Family Medicine

## 2013-07-23 NOTE — Telephone Encounter (Signed)
Med filled.  

## 2013-08-24 ENCOUNTER — Other Ambulatory Visit: Payer: Self-pay | Admitting: Family Medicine

## 2013-08-24 NOTE — Telephone Encounter (Signed)
Rx sent to the pharmacy by e-script.  Pt needs complete physical and fasting labs.//AB/CMA 

## 2013-08-25 ENCOUNTER — Other Ambulatory Visit: Payer: Self-pay | Admitting: Family Medicine

## 2013-08-25 NOTE — Telephone Encounter (Signed)
Med filled and faxed.  

## 2013-08-25 NOTE — Telephone Encounter (Signed)
Last OV 01-25-13 Med filled 02-27-12 #30 with 3  No upcoming appts

## 2013-10-12 LAB — HM MAMMOGRAPHY

## 2013-10-21 ENCOUNTER — Other Ambulatory Visit: Payer: Self-pay | Admitting: Family Medicine

## 2013-10-21 NOTE — Telephone Encounter (Signed)
Med filled.  

## 2014-01-05 ENCOUNTER — Ambulatory Visit (INDEPENDENT_AMBULATORY_CARE_PROVIDER_SITE_OTHER): Payer: BC Managed Care – PPO | Admitting: Medical

## 2014-01-05 ENCOUNTER — Encounter: Payer: Self-pay | Admitting: Medical

## 2014-01-05 VITALS — BP 138/80 | HR 68 | Temp 98.8°F | Ht 63.2 in | Wt 227.6 lb

## 2014-01-05 DIAGNOSIS — J209 Acute bronchitis, unspecified: Secondary | ICD-10-CM

## 2014-01-05 DIAGNOSIS — J018 Other acute sinusitis: Secondary | ICD-10-CM

## 2014-01-05 MED ORDER — BECLOMETHASONE DIPROPIONATE 40 MCG/ACT IN AERS: 2.0000 | INHALATION_SPRAY | Freq: Two times a day (BID) | RESPIRATORY_TRACT | Status: DC

## 2014-01-05 MED ORDER — ALBUTEROL SULFATE HFA 108 (90 BASE) MCG/ACT IN AERS: 2.0000 | INHALATION_SPRAY | Freq: Four times a day (QID) | RESPIRATORY_TRACT | Status: DC | PRN

## 2014-01-05 MED ORDER — HYDROCODONE-HOMATROPINE 5-1.5 MG/5ML PO SYRP: 5.0000 mL | ORAL_SOLUTION | Freq: Three times a day (TID) | ORAL | Status: DC | PRN

## 2014-01-05 MED ORDER — FLUTICASONE PROPIONATE 50 MCG/ACT NA SUSP: 2.0000 | Freq: Every day | NASAL | Status: DC

## 2014-01-05 MED ORDER — AMOXICILLIN-POT CLAVULANATE 875-125 MG PO TABS
1.0000 | ORAL_TABLET | Freq: Two times a day (BID) | ORAL | Status: DC
Start: 2014-01-05 — End: 2014-04-25

## 2014-01-05 NOTE — Assessment & Plan Note (Signed)
Possible as she describes productive cough with some recent wheezing and she is a smoker. So rx inhalers qvar and albuterol. Hycodan for cough. If her symptoms worsen by Friday advised notify us and would get cxr. I do not think indicated presently.

## 2014-01-05 NOTE — Assessment & Plan Note (Signed)
Rx of augmentin. Also rx of fluticasone. Allergic rhinitis symptoms may have preceded.

## 2014-01-05 NOTE — Progress Notes (Signed)
   Subjective:    Patient ID: Allison Reyes, female    DOB: 07-28-1960, 53 y.o.   MRN: [id number]  HPI  Pt in feeling sick since Sunday. She was out of town and Saturday night had tickle in her throat. On Sunday flight home felt sick. On Monday had st with productive cough. Also sinus pressure. She is sweating intermittently and gets subjective fever. She feels more like chest congestion. Hears little bit wheeze at night. No history of wheezing with previous uri illness. Pt is a smoker. Smokes 6 cigarettes a day.    Review of Systems  Constitutional: Positive for fever, chills and fatigue.       Mild fatigue. Mild sweating at times with chills.  HENT: Positive for congestion, ear pain, sinus pressure and sore throat. Negative for hearing loss, nosebleeds, postnasal drip, sneezing, tinnitus and voice change.        On flight had lt ear pressure.  Eyes: Positive for itching. Negative for photophobia, pain and redness.       Mild itchy eyes.(Pt apparently told LPN). She did not tell me this.  Respiratory: Positive for cough and wheezing. Negative for shortness of breath.        Wheezing some at night.  Cardiovascular: Negative for chest pain and palpitations.  Musculoskeletal: Negative for back pain, neck pain and neck stiffness.  Skin: Negative.   Neurological: Negative.   Hematological: Negative for adenopathy. Does not bruise/bleed easily.       Objective:   Physical Exam  General  Mental Status - Alert. General Appearance - Well groomed. Not in acute distress.  Skin Rashes- No Rashes.  HEENT Head- Normal. Ear Auditory Canal - Left- Normal. Right - Normal.Tympanic Membrane- Left- mild pink. Right- mild pinkish red Eye Sclera/Conjunctiva- Left- Normal. Right- Normal. Nose & Sinuses Nasal Mucosa- Left- Boggy +Congested. Right- Boggy +Congested. Frontal sinus pressure to palpation. Mouth & Throat Lips: Upper Lip- Normal: no dryness, cracking, pallor, cyanosis, or vesicular  eruption. Lower Lip-Normal: no dryness, cracking, pallor, cyanosis or vesicular eruption. Buccal Mucosa- Bilateral- No Aphthous ulcers. Oropharynx- No Discharge or Erythema. Tonsils: Characteristics- Bilateral- No Erythema or Congestion. Size/Enlargement- Bilateral- No enlargement. Discharge- bilateral-None.  Neck Neck- Supple. No Masses.   Chest and Lung Exam Auscultation: Breath Sounds:- Clear, even and unlabored.But on deep inspiration does cough.  Cardiovascular Auscultation:Rythm- Regular, rate and rhythm. Murmurs & Other Heart Sounds:Ausculatation of the heart reveal- No Murmurs.  Lymphatic Head & Neck General Head & Neck Lymphatics: Bilateral: Description- No Localized lymphadenopathy.         Assessment & Plan:  I advised pt to not use any promethazine while on hydrocodone and not to use oxycodone while using hydrocdone cough syrup.

## 2014-01-05 NOTE — Patient Instructions (Addendum)
You appear to have sinusitis with possible bronchitis. Your wheezing is likely associated with bronchitis and your history of smoking. I called in Augmentin antibiotic for bacterial infection, hycodan for cough, q-var + albuterol for your wheezing. If by Friday you don't feel significantly improved then notify us and could get cxr and cbc. These test are not indicated presently but would like to get before weekend if not improving. Follow up in 7 days or as needed.

## 2014-02-05 ENCOUNTER — Other Ambulatory Visit: Payer: Self-pay | Admitting: Family Medicine

## 2014-02-05 NOTE — Telephone Encounter (Signed)
Med filled and letter mailed to pt to schedule an appt.  

## 2014-04-04 ENCOUNTER — Other Ambulatory Visit: Payer: Self-pay | Admitting: Family Medicine

## 2014-04-04 NOTE — Telephone Encounter (Signed)
Med denied, pt needs an appt.  

## 2014-04-13 ENCOUNTER — Other Ambulatory Visit: Payer: Self-pay | Admitting: Family Medicine

## 2014-04-13 NOTE — Telephone Encounter (Signed)
Med denied, pt not seen in over a year.

## 2014-04-25 ENCOUNTER — Ambulatory Visit (INDEPENDENT_AMBULATORY_CARE_PROVIDER_SITE_OTHER): Payer: BC Managed Care – PPO | Admitting: Family Medicine

## 2014-04-25 ENCOUNTER — Encounter: Payer: Self-pay | Admitting: Family Medicine

## 2014-04-25 VITALS — BP 125/79 | HR 82 | Temp 98.6°F | Resp 16 | Ht 62.5 in | Wt 224.2 lb

## 2014-04-25 DIAGNOSIS — E038 Other specified hypothyroidism: Secondary | ICD-10-CM

## 2014-04-25 DIAGNOSIS — E785 Hyperlipidemia, unspecified: Secondary | ICD-10-CM

## 2014-04-25 DIAGNOSIS — F341 Dysthymic disorder: Secondary | ICD-10-CM

## 2014-04-25 MED ORDER — CITALOPRAM HYDROBROMIDE 40 MG PO TABS: 40.0000 mg | ORAL_TABLET | Freq: Every day | ORAL | Status: DC

## 2014-04-25 NOTE — Progress Notes (Signed)
   Subjective:    Patient ID: Allison Reyes, female    DOB: 12/05/1960, [age] y.o.   MRN: [id number]  HPI Hypothyroid- chronic problem, on Synthroid.  [facility]  Hyperlipidemia- chronic problem, not currently on medication.  Difficulty w/ exercise due to complications of thyroid disorder.  Pt is very upset about current weight and is trying to lose weight.  No CP, SOB, HAs, visual changes, edema.  Depression/anxiety- pt needs refills on Celexa.  Reports this is working well for her.  Review of Systems For ROS see HPI     Objective:   Physical Exam  Constitutional: She is oriented to person, place, and time. She appears well-developed and well-nourished. No distress.  obese  HENT:  Head: Normocephalic and atraumatic.  Eyes: Conjunctivae and EOM are normal. Pupils are equal, round, and reactive to light.  Neck: Normal range of motion. Neck supple. No thyromegaly present.  Cardiovascular: Normal rate, regular rhythm, normal heart sounds and intact distal pulses.   No murmur heard. Pulmonary/Chest: Effort normal and breath sounds normal. No respiratory distress.  Abdominal: Soft. She exhibits no distension. There is no tenderness.  Musculoskeletal: She exhibits no edema.  Lymphadenopathy:    She has no cervical adenopathy.  Neurological: She is alert and oriented to person, place, and time.  Skin: Skin is warm and dry.  Psychiatric: She has a normal mood and affect. Her behavior is normal.  Vitals reviewed.         Assessment & Plan:

## 2014-04-25 NOTE — Progress Notes (Signed)
Pre visit review using our clinic review tool, if applicable. No additional management support is needed unless otherwise documented below in the visit note/SLS  

## 2014-04-25 NOTE — Patient Instructions (Signed)
Schedule your complete physical in 6 months We'll notify you of your lab results and make any changes if needed Try and make healthy food choices and get regular exercise Citalopram was sent to your mail order pharmacy Call with any questions or concerns Happy New Year!

## 2014-04-26 ENCOUNTER — Telehealth: Payer: Self-pay | Admitting: Family Medicine

## 2014-04-26 LAB — BASIC METABOLIC PANEL
BUN: 14 mg/dL (ref 6–23)
CO2: 28 mEq/L (ref 19–32)
Calcium: 9.1 mg/dL (ref 8.4–10.5)
Chloride: 102 mEq/L (ref 96–112)
Creatinine, Ser: 0.8 mg/dL (ref 0.4–1.2)
GFR: 77.32 mL/min (ref >60.00)
Glucose, Bld: 83 mg/dL (ref 70–99)
Potassium: 4.3 mEq/L (ref 3.5–5.1)
Sodium: 138 mEq/L (ref 135–145)

## 2014-04-26 LAB — HEPATIC FUNCTION PANEL
ALT: 34 U/L (ref 0–35)
AST: 25 U/L (ref 0–37)
Albumin: 4.1 g/dL (ref 3.5–5.2)
Alkaline Phosphatase: 80 U/L (ref 39–117)
Bilirubin, Direct: 0 mg/dL (ref 0.0–0.3)
Total Bilirubin: 0.5 mg/dL (ref 0.2–1.2)
Total Protein: 7.2 g/dL (ref 6.0–8.3)

## 2014-04-26 LAB — LIPID PANEL
Cholesterol: 255 mg/dL — ABNORMAL HIGH (ref 0–200)
HDL: 46.1 mg/dL (ref >39.00)
NonHDL: 208.9
Total CHOL/HDL Ratio: 6
Triglycerides: 289 mg/dL — ABNORMAL HIGH (ref 0.0–149.0)
VLDL: 57.8 mg/dL — ABNORMAL HIGH (ref 0.0–40.0)

## 2014-04-26 LAB — LDL CHOLESTEROL, DIRECT: Direct LDL: 165.8 mg/dL

## 2014-04-26 NOTE — Telephone Encounter (Signed)
emmi emailed °

## 2014-04-27 ENCOUNTER — Other Ambulatory Visit: Payer: Self-pay | Admitting: *Deleted

## 2014-04-27 DIAGNOSIS — E785 Hyperlipidemia, unspecified: Secondary | ICD-10-CM

## 2014-04-27 MED ORDER — ATORVASTATIN CALCIUM 20 MG PO TABS: 20.0000 mg | ORAL_TABLET | Freq: Every day | ORAL | Status: DC

## 2014-05-01 NOTE — Assessment & Plan Note (Signed)
Chronic problem for pt.  Is currently following at [facility].  Has had multiple issues w/ thyroid, parathyroid and hormonal regulation.  Will follow along and assist as able.

## 2014-05-01 NOTE — Assessment & Plan Note (Signed)
Chronic problem.  Adequate control.  Refills provided.

## 2014-05-01 NOTE — Assessment & Plan Note (Signed)
Chronic problem.  Not currently on meds.  Was attempting to control w/ diet and exercise but pt admits to limited exercise.  Check labs.  Start meds prn.

## 2014-06-08 ENCOUNTER — Other Ambulatory Visit: Payer: BC Managed Care – PPO

## 2014-06-09 ENCOUNTER — Telehealth: Payer: Self-pay | Admitting: Family Medicine

## 2014-06-09 MED ORDER — ESOMEPRAZOLE MAGNESIUM 40 MG PO CPDR: DELAYED_RELEASE_CAPSULE | ORAL | Status: DC

## 2014-06-09 NOTE — Telephone Encounter (Signed)
Med filled to both pharmacies.  

## 2014-06-09 NOTE — Telephone Encounter (Signed)
Caller name: Allison Reyes Relation to pt: self Call back number: [phone] Pharmacy:  Reason for call:   Requesting refill of nexium to be sent to [facility] [address]

## 2014-07-08 ENCOUNTER — Encounter: Payer: Self-pay | Admitting: Family Medicine

## 2014-07-08 ENCOUNTER — Telehealth: Payer: Self-pay | Admitting: Family Medicine

## 2014-07-08 ENCOUNTER — Ambulatory Visit (INDEPENDENT_AMBULATORY_CARE_PROVIDER_SITE_OTHER): Payer: BLUE CROSS/BLUE SHIELD | Admitting: Family Medicine

## 2014-07-08 VITALS — BP 130/84 | HR 89 | Temp 98.5°F | Resp 16 | Wt 228.0 lb

## 2014-07-08 DIAGNOSIS — J0100 Acute maxillary sinusitis, unspecified: Secondary | ICD-10-CM

## 2014-07-08 MED ORDER — AMOXICILLIN 875 MG PO TABS: 875.0000 mg | ORAL_TABLET | Freq: Two times a day (BID) | ORAL | Status: DC

## 2014-07-08 MED ORDER — PROMETHAZINE-DM 6.25-15 MG/5ML PO SYRP: 5.0000 mL | ORAL_SOLUTION | Freq: Four times a day (QID) | ORAL | Status: DC | PRN

## 2014-07-08 NOTE — Progress Notes (Signed)
Pre visit review using our clinic review tool, if applicable. No additional management support is needed unless otherwise documented below in the visit note. 

## 2014-07-08 NOTE — Telephone Encounter (Signed)
emmi emailed °

## 2014-07-08 NOTE — Progress Notes (Signed)
   Subjective:    Patient ID: Smiley Allison Reyes, female    DOB: 11/04/1960, 54 y.o.   MRN: [id number]  HPI URI- sxs started ~10 days ago.  + nose bleeds, having productive cough- yellow green sputum.  Alternating sweats and chills.  + sinus pain/pressure.  R ear pain.  + tooth pain.  + nausea, diarrhea.  + sick contacts.   Review of Systems For ROS see HPI     Objective:   Physical Exam  Constitutional: She appears well-developed and well-nourished. No distress.  HENT:  Head: Normocephalic and atraumatic.  Right Ear: Tympanic membrane normal.  Left Ear: Tympanic membrane normal.  Nose: Mucosal edema and rhinorrhea present. Right sinus exhibits maxillary sinus tenderness and frontal sinus tenderness. Left sinus exhibits maxillary sinus tenderness and frontal sinus tenderness.  Mouth/Throat: Uvula is midline and mucous membranes are normal. Posterior oropharyngeal erythema present. No oropharyngeal exudate.  Eyes: Conjunctivae and EOM are normal. Pupils are equal, round, and reactive to light.  Neck: Normal range of motion. Neck supple.  Cardiovascular: Normal rate, regular rhythm and normal heart sounds.   Pulmonary/Chest: Effort normal and breath sounds normal. No respiratory distress. She has no wheezes.  Lymphadenopathy:    She has no cervical adenopathy.  Vitals reviewed.         Assessment & Plan:

## 2014-07-08 NOTE — Patient Instructions (Signed)
Follow up as needed Start the Amoxicillin twice daily- take w/ food Drink plenty of fluids REST! Use the promethazine cough syrup as needed- will cause drowsiness Mucinex DM for daytime cough Call with any questions or concerns Hang in there!!!

## 2014-07-08 NOTE — Assessment & Plan Note (Signed)
Pt's sxs and PE consistent w/ infxn.  Start abx.  Cough meds prn.  Reviewed supportive care and red flags that should prompt return.  Pt expressed understanding and is in agreement w/ plan.  

## 2014-07-20 ENCOUNTER — Encounter: Payer: Self-pay | Admitting: General Practice

## 2014-07-20 ENCOUNTER — Other Ambulatory Visit (INDEPENDENT_AMBULATORY_CARE_PROVIDER_SITE_OTHER): Payer: BLUE CROSS/BLUE SHIELD

## 2014-07-20 DIAGNOSIS — E785 Hyperlipidemia, unspecified: Secondary | ICD-10-CM

## 2014-07-20 LAB — HEPATIC FUNCTION PANEL
ALT: 36 U/L — ABNORMAL HIGH (ref 0–35)
AST: 28 U/L (ref 0–37)
Albumin: 3.9 g/dL (ref 3.5–5.2)
Alkaline Phosphatase: 93 U/L (ref 39–117)
Bilirubin, Direct: 0.1 mg/dL (ref 0.0–0.3)
Total Bilirubin: 0.5 mg/dL (ref 0.2–1.2)
Total Protein: 6.8 g/dL (ref 6.0–8.3)

## 2014-08-09 ENCOUNTER — Telehealth: Payer: Self-pay | Admitting: General Practice

## 2014-08-09 NOTE — Telephone Encounter (Signed)
Spoke with Pt and advised to stop statin for 3 months. Per Note from Dr. Valarie ConesWeber with [facility]. Papers were sent to scanning and pt was scheduled for 11/15/14 at 8:30 and advised to come fasting.

## 2014-09-14 ENCOUNTER — Other Ambulatory Visit: Payer: Self-pay | Admitting: Family Medicine

## 2014-09-14 NOTE — Telephone Encounter (Signed)
Med filled.  

## 2014-09-19 ENCOUNTER — Other Ambulatory Visit: Payer: Self-pay | Admitting: Family Medicine

## 2014-09-19 NOTE — Telephone Encounter (Signed)
Med filled.  

## 2014-09-27 ENCOUNTER — Ambulatory Visit (INDEPENDENT_AMBULATORY_CARE_PROVIDER_SITE_OTHER): Payer: BLUE CROSS/BLUE SHIELD | Admitting: Medical

## 2014-09-27 ENCOUNTER — Encounter: Payer: Self-pay | Admitting: Medical

## 2014-09-27 VITALS — BP 119/64 | HR 67 | Temp 97.9°F | Ht 62.5 in | Wt 234.4 lb

## 2014-09-27 DIAGNOSIS — L739 Follicular disorder, unspecified: Secondary | ICD-10-CM

## 2014-09-27 DIAGNOSIS — T7840XA Allergy, unspecified, initial encounter: Secondary | ICD-10-CM | POA: Diagnosis not present

## 2014-09-27 MED ORDER — METHYLPREDNISOLONE ACETATE 40 MG/ML IJ SUSP
40.0000 mg | Freq: Once | INTRAMUSCULAR | Status: AC
  Administered 2014-09-27: 40 mg via INTRAMUSCULAR

## 2014-09-27 MED ORDER — HYDROXYZINE HCL 25 MG PO TABS: 25.0000 mg | ORAL_TABLET | Freq: Three times a day (TID) | ORAL | Status: DC | PRN

## 2014-09-27 MED ORDER — CEPHALEXIN 500 MG PO CAPS: 500.0000 mg | ORAL_CAPSULE | Freq: Two times a day (BID) | ORAL | Status: DC

## 2014-09-27 MED ORDER — PREDNISONE 20 MG PO TABS: ORAL_TABLET | ORAL | Status: DC

## 2014-09-27 NOTE — Patient Instructions (Addendum)
Allergic reaction depomedrol 40 mg im. Prednisone rx and hydroxyzine rx. Stop benadryl. If rash persists or worsens notify us. If resolves but then return notify us as well. Depending on how you do would consider dermatologist or allergist referral.  Please keep cool and avoid heat as well.   Folliculitis I do believe this is presently superimposed condition and most prevalent on arms. Rx cephalexin.     Note avoid the sun block that you recently used. This may have some component that you are allergic to.  You have some loose stools today. The antibiotic for folliculitis will likely promote some loose stools. Take probiotic otc. If loose stools worsens then would recommend stool panel.  Follow up in 7 days or as needed

## 2014-09-27 NOTE — Assessment & Plan Note (Signed)
I do believe this is presently superimposed condition and most prevalent on arms. Rx cephalexin.

## 2014-09-27 NOTE — Progress Notes (Signed)
Subjective:    Patient ID: Smiley HousemanMaureen Goleman, female    DOB: 06/12/1960, 54 y.o.   MRN: [id number]  HPI  Scattered rash that started on her arms first then seems to spread to her neck and upper back. Rash came up on Saturday. Pt was at the beach. Was not too hot and she was in the water. Pt was using some sun block recently. This itches and burns. Pt tried cold shower last night. Mild fatigue.  On review no very suspiscious exposures that set off.  Rash on legs but not as bad as on her arms.  No hx of sensitive skin.   Pt tried some cortisone cream and some benadryl.   Pt is not diabetic.  Last labs in march looked good.    Review of Systems  Constitutional: Negative for fever, chills and fatigue.  HENT: Negative for congestion, dental problem, drooling, ear pain, sneezing, sore throat and tinnitus.   Respiratory: Negative for cough.   Cardiovascular: Negative for chest pain and palpitations.  Gastrointestinal: Positive for diarrhea. Negative for nausea, vomiting, abdominal pain, constipation, blood in stool, abdominal distention and rectal pain.       3 loose stools today. No recent antibiotics.  Musculoskeletal: Negative for back pain.  Skin:       Scatterd rash that itches a lot x 3 days.  Neurological: Negative for dizziness, seizures, speech difficulty, light-headedness and headaches.  Hematological: Negative for adenopathy. Does not bruise/bleed easily.  Psychiatric/Behavioral: Negative for behavioral problems, confusion, dysphoric mood, decreased concentration and agitation.   Past Medical History  Diagnosis Date  . Depression   . Hyperparathyroidism   . Osteopenia     History   Social History  . Marital Status: Married    Spouse Name: N/A  . Number of Children: N/A  . Years of Education: N/A   Occupational History  . [employer] [place]    Social History Main Topics  . Smoking status: Light Tobacco Smoker    Types:  Cigarettes  . Smokeless tobacco: Not on file     Comment: 7 cigarettes daily.  . Alcohol Use: Yes     Comment: social-wine  . Drug Use: No  . Sexual Activity: Not on file   Other Topics Concern  . Not on file   Social History Narrative   Lives w/ husband, daughter, son    Past Surgical History  Procedure Laterality Date  . Thyroid removed      1/2  . Laparoscopic endometriosis fulguration    . Abdominal hysterectomy    . Oophorectomy    . Nasal sinus surgery  01/03/11    Family History  Problem Relation Age of Onset  . Coronary artery disease Mother   . Hypertension Father   . Hypertension Mother   . Diabetes Neg Hx   . Stroke Paternal Grandfather   . Colon cancer Neg Hx   . Breast cancer Neg Hx     No Known Allergies  Current Outpatient Prescriptions on File Prior to Visit  Medication Sig Dispense Refill  . beclomethasone (QVAR) 40 MCG/ACT inhaler Inhale 2 puffs into the lungs 2 (two) times daily. 1 Inhaler 2  . calcitRIOL (ROCALTROL) 0.5 MCG capsule Take 1 capsule (0.5 mcg total) by mouth 2 (two) times daily. Please take 2 tablets immediately tonight 04/30/13 and then one tablet twice a day (Patient taking differently: Take 0.5 mcg by mouth 3 (three) times daily. Please take 2 tablets immediately tonight 04/30/13 and  then one tablet twice a day) 10 capsule 0  . cetirizine (ZYRTEC) 10 MG tablet Take 10 mg by mouth daily.      . citalopram (CELEXA) 40 MG tablet Take 1 tablet (40 mg total) by mouth daily. 90 tablet 3  . esomeprazole (NEXIUM) 40 MG capsule TAKE 1 CAPSULE DAILY BEFORE BREAKFAST 90 capsule 1  . fluticasone (FLONASE) 50 MCG/ACT nasal spray Place 2 sprays into both nostrils daily. 16 g 2  . levothyroxine (SYNTHROID, LEVOTHROID) 100 MCG tablet TAKE 1 TABLET DAILY (PATIENT NEEDS A COMPLETE PHYSICAL AND FASTING LABS) 90 tablet 0  . ROPINIRole HCl (REQUIP PO) Take by mouth daily as needed (RLS).    Marland Kitchen zolpidem (AMBIEN) 10 MG tablet TAKE 1 TABLET BY MOUTH AT BEDTIME AS  NEEDED. 30 tablet 3   No current facility-administered medications on file prior to visit.    BP 119/64 mmHg  Pulse 67  Temp(Src) 97.9 F (36.6 C) (Oral)  Ht 5' 2.5" (1.588 m)  Wt 234 lb 6.4 oz (106.323 kg)  BMI 42.16 kg/m2  SpO2 93%       Objective:   Physical Exam  General- No acute distress. Pleasant patient. Neck- Full range of motion, no jvd Lungs- Clear, even and unlabored. Heart- regular rate and rhythm. Neurologic- CNII- XII grossly intact.  Derm- scattered mild red rash diffuse arms, legs, neck and face. Some scattered follicles inflamed. Not tender. Her arms are most effected areas.       Assessment & Plan:

## 2014-09-27 NOTE — Assessment & Plan Note (Signed)
depomedrol 40 mg im. Prednisone rx and hydroxyzine rx. Stop benadryl. If rash persists or worsens notify us. If resolves but then return notify us as well. Depending on how you do would consider dermatologist or allergist referral.  Please keep cool and avoid heat as well.

## 2014-09-27 NOTE — Progress Notes (Signed)
Pre visit review using our clinic review tool, if applicable. No additional management support is needed unless otherwise documented below in the visit note. 

## 2014-10-03 ENCOUNTER — Telehealth: Payer: Self-pay | Admitting: Family Medicine

## 2014-10-03 NOTE — Telephone Encounter (Signed)
Pre Visit letter sent  °

## 2014-10-04 ENCOUNTER — Encounter: Payer: Self-pay | Admitting: Medical

## 2014-10-04 ENCOUNTER — Ambulatory Visit (INDEPENDENT_AMBULATORY_CARE_PROVIDER_SITE_OTHER): Payer: BLUE CROSS/BLUE SHIELD | Admitting: Medical

## 2014-10-04 VITALS — BP 124/78 | HR 81 | Temp 99.0°F | Ht 62.5 in | Wt 230.4 lb

## 2014-10-04 DIAGNOSIS — N3000 Acute cystitis without hematuria: Secondary | ICD-10-CM | POA: Diagnosis not present

## 2014-10-04 DIAGNOSIS — R339 Retention of urine, unspecified: Secondary | ICD-10-CM

## 2014-10-04 LAB — POCT URINALYSIS DIPSTICK
Bilirubin, UA: NEGATIVE
Glucose, UA: NEGATIVE
Ketones, UA: NEGATIVE
Nitrite, UA: NEGATIVE
Protein, UA: 15
Spec Grav, UA: 1.01
Urobilinogen, UA: 0.2
pH, UA: 8

## 2014-10-04 MED ORDER — FLUCONAZOLE 150 MG PO TABS: 150.0000 mg | ORAL_TABLET | Freq: Once | ORAL | Status: DC

## 2014-10-04 MED ORDER — PHENAZOPYRIDINE HCL 200 MG PO TABS: 200.0000 mg | ORAL_TABLET | Freq: Three times a day (TID) | ORAL | Status: DC | PRN

## 2014-10-04 MED ORDER — CIPROFLOXACIN HCL 500 MG PO TABS: 500.0000 mg | ORAL_TABLET | Freq: Two times a day (BID) | ORAL | Status: DC

## 2014-10-04 NOTE — Assessment & Plan Note (Signed)
Your appear to have a urinary tract infection. I am prescribing cipro antibiotic for the probable infection. Hydrate well. I am sending out a urine culture. During the interim if your signs and symptoms worsen rather than improving please notify us. We will notify your when the culture results are back.  Follow up in 7 days or as needed.  Pyridium for pain.  Diflucan if you get yeast infection.

## 2014-10-04 NOTE — Progress Notes (Signed)
Subjective:    Patient ID: Allison Reyes, female    DOB: 07/18/1960, 54 y.o.   MRN: [id number]  HPI   Pt in today reporting urinary symptoms for couple of days.  Dysuria- for one day.  Frequent urination-yes Hesitancy-no Suprapubic pressure-yes Fever-yes.  chills-yes Nausea-no Vomiting-no CVA pain- Maybe left lower cva pain. History of UTI- Very rare occasional. Gross hematuria-no  No white dc from vagina. No itching. She just finished cephallexin for folliculitis. And also had allergic rxn. Pt skin is better now.   Review of Systems  Constitutional: Positive for fever. Negative for chills and fatigue.       Subjective.  Cardiovascular: Negative for chest pain and palpitations.  Gastrointestinal: Negative for nausea, vomiting, abdominal pain, diarrhea, constipation, blood in stool and abdominal distention.  Genitourinary: Positive for dysuria, urgency and frequency. Negative for hematuria, decreased urine volume, vaginal bleeding, vaginal discharge, vaginal pain and pelvic pain.       Suprapubic pressure.  Musculoskeletal: Positive for back pain.       Some left cva area pain.  Neurological: Negative for dizziness, weakness and headaches.  Hematological: Negative for adenopathy. Does not bruise/bleed easily.   Past Medical History  Diagnosis Date  . Depression   . Hyperparathyroidism   . Osteopenia     History   Social History  . Marital Status: Married    Spouse Name: N/A  . Number of Children: N/A  . Years of Education: N/A   Occupational History  . [employer] [place]    Social History Main Topics  . Smoking status: Light Tobacco Smoker    Types: Cigarettes  . Smokeless tobacco: Not on file     Comment: 7 cigarettes daily.  . Alcohol Use: Yes     Comment: social-wine  . Drug Use: No  . Sexual Activity: Not on file   Other Topics Concern  . Not on file   Social History Narrative   Lives w/ husband, daughter, son      Past Surgical History  Procedure Laterality Date  . Thyroid removed      1/2  . Laparoscopic endometriosis fulguration    . Abdominal hysterectomy    . Oophorectomy    . Nasal sinus surgery  01/03/11    Family History  Problem Relation Age of Onset  . Coronary artery disease Mother   . Hypertension Father   . Hypertension Mother   . Diabetes Neg Hx   . Stroke Paternal Grandfather   . Colon cancer Neg Hx   . Breast cancer Neg Hx     No Known Allergies  Current Outpatient Prescriptions on File Prior to Visit  Medication Sig Dispense Refill  . beclomethasone (QVAR) 40 MCG/ACT inhaler Inhale 2 puffs into the lungs 2 (two) times daily. 1 Inhaler 2  . calcitRIOL (ROCALTROL) 0.5 MCG capsule Take 1 capsule (0.5 mcg total) by mouth 2 (two) times daily. Please take 2 tablets immediately tonight 04/30/13 and then one tablet twice a day (Patient taking differently: Take 0.5 mcg by mouth 3 (three) times daily. Please take 2 tablets immediately tonight 04/30/13 and then one tablet twice a day) 10 capsule 0  . cetirizine (ZYRTEC) 10 MG tablet Take 10 mg by mouth daily.      . citalopram (CELEXA) 40 MG tablet Take 1 tablet (40 mg total) by mouth daily. 90 tablet 3  . esomeprazole (NEXIUM) 40 MG capsule TAKE 1 CAPSULE DAILY BEFORE BREAKFAST 90 capsule 1  .  fluticasone (FLONASE) 50 MCG/ACT nasal spray Place 2 sprays into both nostrils daily. 16 g 2  . hydrOXYzine (ATARAX/VISTARIL) 25 MG tablet Take 1 tablet (25 mg total) by mouth every 8 (eight) hours as needed for itching. 21 tablet 0  . levothyroxine (SYNTHROID, LEVOTHROID) 100 MCG tablet TAKE 1 TABLET DAILY (PATIENT NEEDS A COMPLETE PHYSICAL AND FASTING LABS) 90 tablet 0  . ROPINIRole HCl (REQUIP PO) Take by mouth daily as needed (RLS).    Marland Kitchen zolpidem (AMBIEN) 10 MG tablet TAKE 1 TABLET BY MOUTH AT BEDTIME AS NEEDED. 30 tablet 3   No current facility-administered medications on file prior to visit.    BP 124/78 mmHg  Pulse 81  Temp(Src) 99  F (37.2 C) (Oral)  Ht 5' 2.5" (1.588 m)  Wt 230 lb 6.4 oz (104.509 kg)  BMI 41.44 kg/m2  SpO2 97%       Objective:   Physical Exam  General  Mental Status- Alert. Orientation- Orientation x 4.   Skin General:- Normal. Moisture- Dry. Temperature- Warm.    Heart Ausculation-RRR  Lungs Ausculation- Clear, even, unlabored bilaterlly.    Abdomen Palpation/Percussion: Palpation and Percussion of the abdomen reveal- moderate suprapubic Tender, No Rebound tenderness, No Rigidity(guarding), No Palpable abdominal masses and No jar tenderness. No suprapubic tenderness. Liver:-Normal. Spleen:- Normal. Other Characteristics- No Costovertebral angle tenderness- Left or Costovertebral angle tenderness- Right.  Auscultation: Auscultation of the abdomen reveals- Bowel Sounds normal.  Back- faint lt cva area pain.      Assessment & Plan:

## 2014-10-04 NOTE — Patient Instructions (Signed)
UTI (urinary tract infection) Your appear to have a urinary tract infection. I am prescribing cipro antibiotic for the probable infection. Hydrate well. I am sending out a urine culture. During the interim if your signs and symptoms worsen rather than improving please notify us. We will notify your when the culture results are back.  Follow up in 7 days or as needed.  Pyridium for pain.  Diflucan if you get yeast infection.    In event you need to take diflucan I want you to not take celexa for 24  Hours before and after due to potential interaction. So use diflucan only if needed.

## 2014-10-04 NOTE — Addendum Note (Signed)
Addended by: Silvio PateHOMPSON, [facility] D on: 10/04/2014 04:32 PM   Modules accepted: Orders

## 2014-10-04 NOTE — Progress Notes (Signed)
Pre visit review using our clinic review tool, if applicable. No additional management support is needed unless otherwise documented below in the visit note. 

## 2014-10-07 LAB — URINE CULTURE: Colony Count: 30000

## 2014-10-20 ENCOUNTER — Telehealth: Payer: Self-pay

## 2014-10-20 NOTE — Telephone Encounter (Signed)
LM for call back

## 2014-10-21 ENCOUNTER — Encounter: Payer: BLUE CROSS/BLUE SHIELD | Admitting: Family Medicine

## 2014-10-21 NOTE — Telephone Encounter (Signed)
Unable to reach pre visit.  

## 2014-11-15 ENCOUNTER — Encounter: Payer: Self-pay | Admitting: Family Medicine

## 2014-11-15 ENCOUNTER — Ambulatory Visit (INDEPENDENT_AMBULATORY_CARE_PROVIDER_SITE_OTHER): Payer: BLUE CROSS/BLUE SHIELD | Admitting: Family Medicine

## 2014-11-15 VITALS — BP 124/80 | HR 70 | Temp 97.9°F | Resp 17 | Ht 63.0 in | Wt 233.2 lb

## 2014-11-15 DIAGNOSIS — J0100 Acute maxillary sinusitis, unspecified: Secondary | ICD-10-CM | POA: Diagnosis not present

## 2014-11-15 DIAGNOSIS — E785 Hyperlipidemia, unspecified: Secondary | ICD-10-CM | POA: Diagnosis not present

## 2014-11-15 LAB — HEPATIC FUNCTION PANEL
ALT: 29 U/L (ref 0–35)
AST: 23 U/L (ref 0–37)
Albumin: 3.9 g/dL (ref 3.5–5.2)
Alkaline Phosphatase: 78 U/L (ref 39–117)
Bilirubin, Direct: 0.1 mg/dL (ref 0.0–0.3)
Total Bilirubin: 0.6 mg/dL (ref 0.2–1.2)
Total Protein: 6.6 g/dL (ref 6.0–8.3)

## 2014-11-15 LAB — LIPID PANEL
Cholesterol: 229 mg/dL — ABNORMAL HIGH (ref 0–200)
HDL: 46.7 mg/dL (ref >39.00)
LDL Cholesterol: 149 mg/dL — ABNORMAL HIGH (ref 0–99)
NonHDL: 182.3
Total CHOL/HDL Ratio: 5
Triglycerides: 166 mg/dL — ABNORMAL HIGH (ref 0.0–149.0)
VLDL: 33.2 mg/dL (ref 0.0–40.0)

## 2014-11-15 MED ORDER — HYDROXYZINE HCL 50 MG PO TABS: 50.0000 mg | ORAL_TABLET | Freq: Three times a day (TID) | ORAL | Status: DC | PRN

## 2014-11-15 MED ORDER — AMOXICILLIN 875 MG PO TABS: 875.0000 mg | ORAL_TABLET | Freq: Two times a day (BID) | ORAL | Status: DC

## 2014-11-15 NOTE — Progress Notes (Signed)
Pre visit review using our clinic review tool, if applicable. No additional management support is needed unless otherwise documented below in the visit note. 

## 2014-11-15 NOTE — Patient Instructions (Signed)
Schedule your complete physical in 6 months We'll notify you of your lab results and make any changes if needed Start the Amoxicillin twice daily- take w/ food Drink plenty of fluids Restart the Zyrtec daily Use the Meclizine for the dizziness Change positions slowly Call with any questions or concerns Hang in there!!!

## 2014-11-15 NOTE — Progress Notes (Signed)
   Subjective:    Patient ID: Allison Reyes, female    DOB: 09/22/1960, 54 y.o.   MRN: [id number]  HPI Hyperlipidemia- chronic problem.  Pt stopped statin in April at the recommendation of Dr Valarie ConesWeber w/ the plan to recheck in 3 months.  Pt reports she felt less exhausted after stopping Lipitor.  Not following low fat diet and not getting regular exercise.  'sinus something'- 'i'm extremely dizzy'.  sxs started 'in the last couple days'.  Has not taken Zyrtec in 10 days.  No fevers.  Intermittent ear pain/fullness.  + facial pressure, pain behind eyes.  No known sick contacts.  Review of Systems For ROS see HPI     Objective:   Physical Exam  Constitutional: She appears well-developed and well-nourished. No distress.  HENT:  Head: Normocephalic and atraumatic.  Right Ear: Tympanic membrane normal.  Left Ear: Tympanic membrane is erythematous and bulging.  Nose: Mucosal edema and rhinorrhea present. Right sinus exhibits maxillary sinus tenderness. Right sinus exhibits no frontal sinus tenderness. Left sinus exhibits maxillary sinus tenderness. Left sinus exhibits no frontal sinus tenderness.  Mouth/Throat: Uvula is midline and mucous membranes are normal. Posterior oropharyngeal erythema present. No oropharyngeal exudate.  Eyes: Conjunctivae and EOM are normal. Pupils are equal, round, and reactive to light.  Neck: Normal range of motion. Neck supple.  Cardiovascular: Normal rate, regular rhythm and normal heart sounds.   Pulmonary/Chest: Effort normal and breath sounds normal. No respiratory distress. She has no wheezes.  Lymphadenopathy:    She has no cervical adenopathy.  Vitals reviewed.         Assessment & Plan:

## 2014-11-15 NOTE — Assessment & Plan Note (Signed)
Pt's sxs and PE consistent w/ infxn.  This is also the likely cause of pt's vertigo.  Start abx for the infxn and meclizine prn dizziness.  Reviewed supportive care and red flags that should prompt return.  Pt expressed understanding and is in agreement w/ plan.

## 2014-11-15 NOTE — Assessment & Plan Note (Signed)
Chronic problem.  Did not tolerate Lipitor due to fatigue.  Has been off statin x3 months.  Check labs.  If need be, will start Zetia.  Pt expressed understanding and is in agreement w/ plan.

## 2014-11-16 ENCOUNTER — Encounter: Payer: Self-pay | Admitting: General Practice

## 2014-11-17 ENCOUNTER — Telehealth: Payer: Self-pay | Admitting: Family Medicine

## 2014-11-17 MED ORDER — PREDNISONE 10 MG PO TABS: ORAL_TABLET | ORAL | Status: DC

## 2014-11-17 MED ORDER — PROMETHAZINE HCL 25 MG PO TABS: 25.0000 mg | ORAL_TABLET | Freq: Three times a day (TID) | ORAL | Status: DC | PRN

## 2014-11-17 NOTE — Telephone Encounter (Signed)
Rxs sent to pharmacy.  Pt notified and made aware.  No additional concerns voiced at this time.

## 2014-11-17 NOTE — Telephone Encounter (Signed)
Please advise 

## 2014-11-17 NOTE — Telephone Encounter (Signed)
Patient Name: Allison Reyes DOB: [birth year] Initial Comment Caller states c/o vertigo despite Rx, difficulty keeping eyes open Nurse Assessment Nurse: Elijah Birk, RN, Lynda Date/Time (Eastern Time): 11/17/2014 1:43:15 PM Confirm and document reason for call. If symptomatic, describe symptoms. ---Caller states c/o vertigo despite Rx - antibiotic - Amoxicillin & Hydroxyzine HCL 50 mg, difficulty keeping eyes open. Has pressure behind her eyes. Seen Tuesday, thought it was a sinus infection. Her symptoms have not improved. Has the patient traveled out of the country within the last 30 days? ---Not Applicable Does the patient require triage? ---Yes Related visit to physician within the last 2 weeks? ---Yes Does the PT have any chronic conditions? (i.e. diabetes, asthma, etc.) ---No Did the patient indicate they were pregnant? ---No Guidelines Guideline Title Affirmed Question Affirmed Notes Sinus Infection on Antibiotic Follow-up Call [1] Taking antibiotic < 72 hours (3 days) AND [2] sinus pain not improved (all triage questions negative) Final Disposition User See Physician within 24 Hours [place], RN, Stark Bray Comments Caller states she has been on the rxs since mid day Tuesday, no improvement at all, especially with the vertigo. Advised that sometimes it can take up to 72 hours for signs of improvement, but that if the vertigo/pressure in eyes is interfering with her ability to even open eyes, or function, she may need additional therapy. Uses [facility] on [address]. NKDA. Caller wondered about a steroid, has some at home, but nurse would not advise using without direction from her Dr. Evette Doffing the antibiotic twice a day & the Hydroxyzine every 6-8 hours. Please contact patient at this # this afternoon with more advice/feedback regarding her symptoms. She is unable to drive or get transportation to the office & can not work d/t vertigo. Referrals REFERRED TO PCP  OFFICE Disagree/Comply: Disagree Disagree/Comply Reason: Unable to find transportation

## 2014-11-17 NOTE — Telephone Encounter (Signed)
Ok for promethazine  TID prn nausea, #30, no refills to assist w/ her vertigo Ok for Prednisone - 3 tabs x3 days, 2 tabs x3 days, 1 tab x3 days, #18, take w/ food for inflammation of sinuses and vertigo If no improvement or worsening- needs ER

## 2014-12-19 ENCOUNTER — Other Ambulatory Visit: Payer: Self-pay | Admitting: Family Medicine

## 2014-12-19 NOTE — Telephone Encounter (Signed)
Medication filled to pharmacy as requested.   

## 2015-01-16 ENCOUNTER — Ambulatory Visit (INDEPENDENT_AMBULATORY_CARE_PROVIDER_SITE_OTHER): Payer: BLUE CROSS/BLUE SHIELD | Admitting: Medical

## 2015-01-16 ENCOUNTER — Encounter: Payer: Self-pay | Admitting: Medical

## 2015-01-16 VITALS — BP 103/78 | HR 69 | Temp 97.6°F | Resp 16 | Ht 63.0 in | Wt 234.8 lb

## 2015-01-16 DIAGNOSIS — R5383 Other fatigue: Secondary | ICD-10-CM

## 2015-01-16 DIAGNOSIS — M2550 Pain in unspecified joint: Secondary | ICD-10-CM

## 2015-01-16 DIAGNOSIS — Z8639 Personal history of other endocrine, nutritional and metabolic disease: Secondary | ICD-10-CM

## 2015-01-16 DIAGNOSIS — J029 Acute pharyngitis, unspecified: Secondary | ICD-10-CM | POA: Diagnosis not present

## 2015-01-16 LAB — CBC WITH DIFFERENTIAL/PLATELET
Basophils Absolute: 0.1 10*3/uL (ref 0.0–0.1)
Basophils Relative: 1.1 % (ref 0.0–3.0)
Eosinophils Absolute: 0.2 10*3/uL (ref 0.0–0.7)
Eosinophils Relative: 3.4 % (ref 0.0–5.0)
HCT: 40.2 % (ref 36.0–46.0)
Hemoglobin: 13.6 g/dL (ref 12.0–15.0)
Lymphocytes Relative: 30.4 % (ref 12.0–46.0)
Lymphs Abs: 1.4 10*3/uL (ref 0.7–4.0)
MCHC: 33.8 g/dL (ref 30.0–36.0)
MCV: 91.6 fl (ref 78.0–100.0)
Monocytes Absolute: 0.3 10*3/uL (ref 0.1–1.0)
Monocytes Relative: 6.4 % (ref 3.0–12.0)
Neutro Abs: 2.6 10*3/uL (ref 1.4–7.7)
Neutrophils Relative %: 58.7 % (ref 43.0–77.0)
Platelets: 293 10*3/uL (ref 150.0–400.0)
RBC: 4.39 Mil/uL (ref 3.87–5.11)
RDW: 12.9 % (ref 11.5–15.5)
WBC: 4.5 10*3/uL (ref 4.0–10.5)

## 2015-01-16 LAB — COMPREHENSIVE METABOLIC PANEL
ALT: 29 U/L (ref 0–35)
AST: 20 U/L (ref 0–37)
Albumin: 3.9 g/dL (ref 3.5–5.2)
Alkaline Phosphatase: 69 U/L (ref 39–117)
BUN: 12 mg/dL (ref 6–23)
CO2: 27 mEq/L (ref 19–32)
Calcium: 8.8 mg/dL (ref 8.4–10.5)
Chloride: 103 mEq/L (ref 96–112)
Creatinine, Ser: 0.86 mg/dL (ref 0.40–1.20)
GFR: 72.98 mL/min (ref >60.00)
Glucose, Bld: 91 mg/dL (ref 70–99)
Potassium: 4 mEq/L (ref 3.5–5.1)
Sodium: 139 mEq/L (ref 135–145)
Total Bilirubin: 0.5 mg/dL (ref 0.2–1.2)
Total Protein: 6.7 g/dL (ref 6.0–8.3)

## 2015-01-16 LAB — POCT RAPID STREP A (OFFICE): Rapid Strep A Screen: NEGATIVE

## 2015-01-16 LAB — C-REACTIVE PROTEIN: CRP: 0.3 mg/dL — ABNORMAL LOW (ref 0.5–20.0)

## 2015-01-16 LAB — TSH: TSH: 2.06 u[IU]/mL (ref 0.35–4.50)

## 2015-01-16 LAB — RHEUMATOID FACTOR: Rhuematoid fact SerPl-aCnc: <10 IU/mL (ref <=14)

## 2015-01-16 LAB — SEDIMENTATION RATE: Sed Rate: 9 mm/hr (ref 0–22)

## 2015-01-16 MED ORDER — BENZONATATE 100 MG PO CAPS: 100.0000 mg | ORAL_CAPSULE | Freq: Three times a day (TID) | ORAL | Status: DC | PRN

## 2015-01-16 MED ORDER — FLUTICASONE PROPIONATE 50 MCG/ACT NA SUSP: 2.0000 | Freq: Every day | NASAL | Status: DC

## 2015-01-16 MED ORDER — CEFDINIR 300 MG PO CAPS: 300.0000 mg | ORAL_CAPSULE | Freq: Two times a day (BID) | ORAL | Status: DC

## 2015-01-16 NOTE — Progress Notes (Signed)
Pre visit review using our clinic review tool, if applicable. No additional management support is needed unless otherwise documented below in the visit note. 

## 2015-01-16 NOTE — Patient Instructions (Addendum)
Rapid strep was negative(but can be falsely negative). By appearance of throat and lt submandibular node tenderness,  I decided to give you cefdnir antiobitic. Which should work for your sinus which have been tender. Tympanic membranes may be early infected as well.  For nasal congestion rx of flonase. For cough benzonatate.  For your fatigue will order cbc, cmp and tsh.  For your joint pain, I order arthritis panel with sed rate.  For your hx of hyperparathyroidism I did order PTH. Will get level and plan to refer back to your endocrinologist.  Follow up in 2 wks or as needed

## 2015-01-16 NOTE — Progress Notes (Signed)
Subjective:    Patient ID: Allison Reyes, female    DOB: 07-13-1960, 54 y.o.   MRN: [id number]  HPI  Pt states last 2 days cough, congestion and sore throat. She states worst symptoms past 2 days. Mild nasal congestion symptom for 2 wks. Pt states some mucous that is colored when she blows nose now and sinus pressure presently.  Some sneezing on and off.   Pt states also has been fatigued for months. Pt states statin was stopped in past but did not help much. Also she thought summer heat was making her fatigued. Pt achiness presently as well. Pt has some hand and feet pain as well at times. Hand and feet ache daily basis when she walks to her office after parking the car.  Pt has hx of hyperparathyroidism. Pt has seen specialist. Pt last saw her specialist 2 months ago. See Dr. Gwendolyn Grant at [facility]. [phone] and [phone].   Review of Systems  Constitutional: Positive for fever and chills. Negative for fatigue.  HENT: Positive for congestion and sore throat. Negative for postnasal drip and sneezing.   Respiratory: Negative for cough, choking, chest tightness, shortness of breath and wheezing.   Cardiovascular: Negative for chest pain and palpitations.  Gastrointestinal: Negative for abdominal pain.  Musculoskeletal: Negative for back pain.       Some achiness for months. Daily hand and feet pain in the am. Some stiffness and feeling of hand and feet swelling.  Hematological: Negative for adenopathy. Does not bruise/bleed easily.  Psychiatric/Behavioral: Negative for suicidal ideas, behavioral problems, confusion and dysphoric mood. The patient is not nervous/anxious.     Past Medical History  Diagnosis Date  . Depression   . Hyperparathyroidism   . Osteopenia   . Hyperlipidemia     Social History   Social History  . Marital Status: Married    Spouse Name: N/A  . Number of Children: N/A  . Years of Education: N/A   Occupational History  . [employer]ist in [place]    Social History Main Topics  . Smoking status: Light Tobacco Smoker    Types: Cigarettes  . Smokeless tobacco: Not on file     Comment: 7 cigarettes daily.  . Alcohol Use: Yes     Comment: social-wine  . Drug Use: No  . Sexual Activity: Not on file   Other Topics Concern  . Not on file   Social History Narrative   Lives w/ husband, daughter, son    Past Surgical History  Procedure Laterality Date  . Thyroid removed      1/2  . Laparoscopic endometriosis fulguration    . Abdominal hysterectomy    . Oophorectomy    . Nasal sinus surgery  01/03/11    Family History  Problem Relation Age of Onset  . Coronary artery disease Mother   . Hypertension Father   . Hypertension Mother   . Diabetes Neg Hx   . Stroke Paternal Grandfather   . Colon cancer Neg Hx   . Breast cancer Neg Hx     No Known Allergies  Current Outpatient Prescriptions on File Prior to Visit  Medication Sig Dispense Refill  . beclomethasone (QVAR) 40 MCG/ACT inhaler Inhale 2 puffs into the lungs 2 (two) times daily. 1 Inhaler 2  . calcitRIOL (ROCALTROL) 0.5 MCG capsule Take 1 capsule (0.5 mcg total) by mouth 2 (two) times daily. Please take 2 tablets immediately tonight 04/30/13 and then one tablet twice a day (Patient  taking differently: Take 0.5 mcg by mouth 3 (three) times daily. Please take 2 tablets immediately tonight 04/30/13 and then one tablet twice a day) 10 capsule 0  . cetirizine (ZYRTEC) 10 MG tablet Take 10 mg by mouth daily.      . citalopram (CELEXA) 40 MG tablet Take 1 tablet (40 mg total) by mouth daily. 90 tablet 3  . esomeprazole (NEXIUM) 40 MG capsule TAKE 1 CAPSULE DAILY BEFORE BREAKFAST 90 capsule 1  . fluticasone (FLONASE) 50 MCG/ACT nasal spray Place 2 sprays into both nostrils daily. 16 g 2  . hydrOXYzine (ATARAX/VISTARIL) 50 MG tablet Take 1 tablet (50 mg total) by mouth 3 (three) times daily as needed. 45 tablet 0  . levothyroxine (SYNTHROID, LEVOTHROID) 100  MCG tablet Take 1 tablet (100 mcg total) by mouth daily before breakfast. 90 tablet 1  . promethazine (PHENERGAN) 25 MG tablet Take 1 tablet (25 mg total) by mouth every 8 (eight) hours as needed for nausea or vomiting. 30 tablet 0  . ROPINIRole HCl (REQUIP PO) Take by mouth daily as needed (RLS).    Marland Kitchen zolpidem (AMBIEN) 10 MG tablet TAKE 1 TABLET BY MOUTH AT BEDTIME AS NEEDED. 30 tablet 3   No current facility-administered medications on file prior to visit.    BP 103/78 mmHg  Pulse 69  Temp(Src) 97.6 F (36.4 C) (Oral)  Resp 16  Ht  (1.6 m)  Wt 234 lb 12.8 oz (106.505 kg)  BMI 41.60 kg/m2  SpO2 100%       Objective:   Physical Exam  General  Mental Status - Alert. General Appearance - Well groomed. Not in acute distress.  Skin Rashes- No Rashes.  HEENT Head- Normal. Ear Auditory Canal - Left- Normal. Right - Normal.Tympanic Membrane- Left- mild dull  Right- mild dull Eye Sclera/Conjunctiva- Left- Normal. Right- Normal. Nose & Sinuses Nasal Mucosa- Left-  Boggy and Congested. Right-  Boggy and  Congested.Bilateral no  maxillary and  No frontal sinus pressure. Mouth & Throat Lips: Upper Lip- Normal: no dryness, cracking, pallor, cyanosis, or vesicular eruption. Lower Lip-Normal: no dryness, cracking, pallor, cyanosis or vesicular eruption. Buccal Mucosa- Bilateral- No Aphthous ulcers. Oropharynx- No Discharge or Erythema. +pnd. Tonsils: Characteristics- Bilateral- Mild- moderate  Erythema and  Congestion. Size/Enlargement- Bilateral- No enlargement. Discharge- bilateral-None.  Neck Neck- Supple. No Masses. Lt submandibular node mild tender to palpation and mild swollen.   Chest and Lung Exam Auscultation: Breath Sounds:-Clear even and unlabored.  Cardiovascular Auscultation:Rythm- Regular, rate and rhythm. Murmurs & Other Heart Sounds:Ausculatation of the heart reveal- No Murmurs.  Lymphatic Head & Neck General Head & Neck Lymphatics: Bilateral:  Description- No Localized lymphadenopathy.  Hands- good flexion and extension of digits. Mcp joints look mild thick. Not real obvious.No warmth  And no tenderness  presently.       Assessment & Plan:  Rapid strep was negative(but can be falsely negative). By appearance of throat and lt submandibular node tenderness,  I decided to give you cefdnir antiobitic. Which should work for your sinus which have been tender(per pt report). Tympanic membranes may be early infected as well.  For nasal congestion rx of flonase. For cough benzonatate.  For your fatigue will order cbc, cmp and tsh.  For your joint pain, I order arthritis panel with sed rate.  For your hx of hyperparathyroidism I did order PTH. Will get level and plan to refer back to your endocrinologist.  Follow up in 2 wks or as needed

## 2015-01-17 LAB — PARATHYROID HORMONE, INTACT (NO CA): PTH: 20 pg/mL (ref 14–64)

## 2015-01-17 LAB — ANA: Anti Nuclear Antibody(ANA): NEGATIVE

## 2015-03-14 ENCOUNTER — Other Ambulatory Visit: Payer: Self-pay | Admitting: Family Medicine

## 2015-03-14 NOTE — Telephone Encounter (Signed)
Medication filled to pharmacy as requested.   

## 2015-04-16 ENCOUNTER — Other Ambulatory Visit: Payer: Self-pay | Admitting: Family Medicine

## 2015-04-17 NOTE — Telephone Encounter (Signed)
Medication filled to pharmacy as requested.   

## 2015-05-19 ENCOUNTER — Ambulatory Visit (INDEPENDENT_AMBULATORY_CARE_PROVIDER_SITE_OTHER): Payer: BLUE CROSS/BLUE SHIELD | Admitting: Family Medicine

## 2015-05-19 ENCOUNTER — Encounter: Payer: Self-pay | Admitting: Family Medicine

## 2015-05-19 VITALS — BP 100/70 | HR 72 | Temp 98.0°F | Resp 16 | Ht 63.0 in | Wt 233.0 lb

## 2015-05-19 DIAGNOSIS — Z0000 Encounter for general adult medical examination without abnormal findings: Secondary | ICD-10-CM

## 2015-05-19 DIAGNOSIS — J0100 Acute maxillary sinusitis, unspecified: Secondary | ICD-10-CM

## 2015-05-19 DIAGNOSIS — Z5181 Encounter for therapeutic drug level monitoring: Secondary | ICD-10-CM | POA: Insufficient documentation

## 2015-05-19 DIAGNOSIS — Z1211 Encounter for screening for malignant neoplasm of colon: Secondary | ICD-10-CM

## 2015-05-19 DIAGNOSIS — Z79899 Other long term (current) drug therapy: Secondary | ICD-10-CM | POA: Insufficient documentation

## 2015-05-19 DIAGNOSIS — E213 Hyperparathyroidism, unspecified: Secondary | ICD-10-CM | POA: Diagnosis not present

## 2015-05-19 LAB — BASIC METABOLIC PANEL
BUN: 19 mg/dL (ref 6–23)
CO2: 28 mEq/L (ref 19–32)
Calcium: 8.9 mg/dL (ref 8.4–10.5)
Chloride: 103 mEq/L (ref 96–112)
Creatinine, Ser: 0.95 mg/dL (ref 0.40–1.20)
GFR: 64.98 mL/min (ref >60.00)
Glucose, Bld: 101 mg/dL — ABNORMAL HIGH (ref 70–99)
Potassium: 4.2 mEq/L (ref 3.5–5.1)
Sodium: 140 mEq/L (ref 135–145)

## 2015-05-19 LAB — HEPATIC FUNCTION PANEL
ALT: 32 U/L (ref 0–35)
AST: 20 U/L (ref 0–37)
Albumin: 3.9 g/dL (ref 3.5–5.2)
Alkaline Phosphatase: 76 U/L (ref 39–117)
Bilirubin, Direct: 0.1 mg/dL (ref 0.0–0.3)
Total Bilirubin: 0.7 mg/dL (ref 0.2–1.2)
Total Protein: 6.9 g/dL (ref 6.0–8.3)

## 2015-05-19 LAB — LIPID PANEL
Cholesterol: 232 mg/dL — ABNORMAL HIGH (ref 0–200)
HDL: 45.1 mg/dL (ref >39.00)
LDL Cholesterol: 152 mg/dL — ABNORMAL HIGH (ref 0–99)
NonHDL: 187.07
Total CHOL/HDL Ratio: 5
Triglycerides: 174 mg/dL — ABNORMAL HIGH (ref 0.0–149.0)
VLDL: 34.8 mg/dL (ref 0.0–40.0)

## 2015-05-19 LAB — CBC WITH DIFFERENTIAL/PLATELET
Basophils Absolute: 0.1 10*3/uL (ref 0.0–0.1)
Basophils Relative: 1.2 % (ref 0.0–3.0)
Eosinophils Absolute: 0.2 10*3/uL (ref 0.0–0.7)
Eosinophils Relative: 3.3 % (ref 0.0–5.0)
HCT: 41.7 % (ref 36.0–46.0)
Hemoglobin: 13.9 g/dL (ref 12.0–15.0)
Lymphocytes Relative: 24.8 % (ref 12.0–46.0)
Lymphs Abs: 1.4 10*3/uL (ref 0.7–4.0)
MCHC: 33.2 g/dL (ref 30.0–36.0)
MCV: 92.4 fl (ref 78.0–100.0)
Monocytes Absolute: 0.4 10*3/uL (ref 0.1–1.0)
Monocytes Relative: 8 % (ref 3.0–12.0)
Neutro Abs: 3.5 10*3/uL (ref 1.4–7.7)
Neutrophils Relative %: 62.7 % (ref 43.0–77.0)
Platelets: 289 10*3/uL (ref 150.0–400.0)
RBC: 4.51 Mil/uL (ref 3.87–5.11)
RDW: 12.9 % (ref 11.5–15.5)
WBC: 5.6 10*3/uL (ref 4.0–10.5)

## 2015-05-19 LAB — TSH: TSH: 1.9 u[IU]/mL (ref 0.35–4.50)

## 2015-05-19 LAB — HEMOGLOBIN A1C: Hgb A1c MFr Bld: 6 % (ref 4.6–6.5)

## 2015-05-19 LAB — VITAMIN D 25 HYDROXY (VIT D DEFICIENCY, FRACTURES): VITD: 15.78 ng/mL — ABNORMAL LOW (ref 30.00–100.00)

## 2015-05-19 MED ORDER — AMOXICILLIN 875 MG PO TABS: 875.0000 mg | ORAL_TABLET | Freq: Two times a day (BID) | ORAL | Status: DC

## 2015-05-19 MED ORDER — CETIRIZINE HCL 10 MG PO TABS: 10.0000 mg | ORAL_TABLET | Freq: Every day | ORAL | Status: DC

## 2015-05-19 NOTE — Progress Notes (Signed)
   Subjective:    Patient ID: Allison Reyes, female    DOB: Mar 17, 1961, 55 y.o.   MRN: [id number]  HPI CPE- due for colonoscopy (last done 2003 in [place]), mammo [facility]).  UTD on flu shot.   Review of Systems Patient reports no vision/ hearing changes, adenopathy,fever, weight change, swallowing issues, chest pain, palpitations, edema, persistant/recurrent cough, hemoptysis, gastrointestinal bleeding (melena, rectal bleeding), abdominal pain, significant heartburn, GU symptoms (dysuria, hematuria, incontinence), Gyn symptoms (abnormal  bleeding, pain),  syncope, focal weakness, memory loss, skin/hair/nail changes, abnormal bruising or bleeding, anxiety, or depression.   + memory issues + hoarseness + dyspnea on stairs + diarrhea + numbness/tingling of hands/feet    Objective:   Physical Exam General Appearance:    Alert, cooperative, no distress, appears stated age, obese  Head:    Normocephalic, without obvious abnormality, atraumatic  Eyes:    PERRL, conjunctiva/corneas clear, EOM's intact, fundi    benign, both eyes  Ears:    R TM retracted, L TM WNL and external ear canals, both ears  Nose:   TTP over R maxillary sinus, turbinate edema and congestion bilaterally  Throat:   Lips, mucosa, and tongue normal; teeth and gums normal  Neck:   Supple, symmetrical, trachea midline, no adenopathy;    Thyroid: no enlargement/tenderness/nodules  Back:     Symmetric, no curvature, ROM normal, no CVA tenderness  Lungs:     Clear to auscultation bilaterally, respirations unlabored  Chest Wall:    No tenderness or deformity   Heart:    Regular rate and rhythm, S1 and S2 normal, no murmur, rub   or gallop  Breast Exam:    Deferred to mammo  Abdomen:     Soft, non-tender, bowel sounds active all four quadrants,    no masses, no organomegaly  Genitalia:    Deferred  Rectal:    Extremities:   Extremities normal, atraumatic, no cyanosis or edema  Pulses:   2+ and symmetric all extremities    Skin:   Skin color, texture, turgor normal, no rashes or lesions  Lymph nodes:   Cervical, supraclavicular, and axillary nodes normal  Neurologic:   CNII-XII intact, normal strength, sensation and reflexes    throughout          Assessment & Plan:

## 2015-05-19 NOTE — Progress Notes (Signed)
Pre visit review using our clinic review tool, if applicable. No additional management support is needed unless otherwise documented below in the visit note. 

## 2015-05-19 NOTE — Assessment & Plan Note (Signed)
Ongoing issue for pt.  Not following particular diet or exercise plan.  Discussed importance of both.  Will follow.

## 2015-05-19 NOTE — Assessment & Plan Note (Signed)
Chronic problem.  Pt typically follows w/ [facility] but has not been seen recently.  Check labs.  Adjust meds prn.

## 2015-05-19 NOTE — Assessment & Plan Note (Signed)
New.  Pt's sxs and PE consistent w/ infxn.  6 weeks of nasal congestion, sinus pressure, R ear pain.  Start abx.  Reviewed supportive care and red flags that should prompt return.  Pt expressed understanding and is in agreement w/ plan.

## 2015-05-19 NOTE — Assessment & Plan Note (Signed)
Pt's PE WNL w/ exception of obesity and current sinus infxn.  Overdue on colonoscopy and mammo- pt will schedule mammo, referral placed for colonoscopy.  UTD on flu shot.  No need for pap due to hysterectomy.  Check labs.  Anticipatory guidance provided.

## 2015-05-19 NOTE — Patient Instructions (Signed)
Schedule your complete physical in 1 year We'll notify you of your lab results and make any changes if needed Continue to work on healthy diet and regular exercise We'll call you with your GI appt for the colonoscopy [facility] at [phone] to schedule your mammo Call and schedule therapy/psychiatry at your convenience Start the Amoxicillin twice daily- take w/ food Drink plenty of fluids Start the Zyrtec daily Call with any questions or concerns Happy New Year!!

## 2015-05-22 LAB — PTH, INTACT AND CALCIUM
Calcium: 9.2 mg/dL (ref 8.4–10.5)
PTH: 15 pg/mL (ref 14–64)

## 2015-05-23 ENCOUNTER — Other Ambulatory Visit: Payer: Self-pay | Admitting: General Practice

## 2015-05-26 ENCOUNTER — Encounter: Payer: Self-pay | Admitting: General Practice

## 2015-05-26 MED ORDER — ATORVASTATIN CALCIUM 20 MG PO TABS: 20.0000 mg | ORAL_TABLET | Freq: Every day | ORAL | Status: DC

## 2015-05-26 MED ORDER — VITAMIN D (ERGOCALCIFEROL) 1.25 MG (50000 UNIT) PO CAPS: 50000.0000 [IU] | ORAL_CAPSULE | ORAL | Status: DC

## 2015-07-10 ENCOUNTER — Other Ambulatory Visit: Payer: Self-pay | Admitting: Family Medicine

## 2015-07-10 NOTE — Telephone Encounter (Signed)
Medication filled to pharmacy as requested.   

## 2015-07-15 ENCOUNTER — Other Ambulatory Visit: Payer: Self-pay | Admitting: Family Medicine

## 2015-07-15 NOTE — Telephone Encounter (Signed)
Medication filled to pharmacy as requested.   

## 2015-07-19 ENCOUNTER — Other Ambulatory Visit: Payer: Self-pay | Admitting: *Deleted

## 2015-07-19 NOTE — Telephone Encounter (Signed)
Received request for 90-day supply of Atorvastatin 20 mg & Vit D2 50,000 IU

## 2015-07-28 ENCOUNTER — Other Ambulatory Visit: Payer: Self-pay

## 2015-07-28 MED ORDER — ATORVASTATIN CALCIUM 20 MG PO TABS: 20.0000 mg | ORAL_TABLET | Freq: Every day | ORAL | Status: DC

## 2015-07-28 MED ORDER — VITAMIN D (ERGOCALCIFEROL) 1.25 MG (50000 UNIT) PO CAPS: 50000.0000 [IU] | ORAL_CAPSULE | ORAL | Status: DC

## 2015-08-01 ENCOUNTER — Other Ambulatory Visit: Payer: Self-pay | Admitting: General Practice

## 2015-08-01 ENCOUNTER — Telehealth: Payer: Self-pay | Admitting: Family Medicine

## 2015-08-01 MED ORDER — VITAMIN D (ERGOCALCIFEROL) 1.25 MG (50000 UNIT) PO CAPS: 50000.0000 [IU] | ORAL_CAPSULE | ORAL | Status: DC

## 2015-08-01 MED ORDER — ATORVASTATIN CALCIUM 20 MG PO TABS: 20.0000 mg | ORAL_TABLET | Freq: Every day | ORAL | Status: DC

## 2015-08-01 NOTE — Telephone Encounter (Signed)
MARTY WITH [facility] CALLED WANTING A 90 DAY FOR ATROVASTATIN 20 MG FOR THIS PATIENT.  PLEASE CALL [phone] REF # [id number]

## 2015-08-01 NOTE — Telephone Encounter (Signed)
This was filled electronically today.

## 2015-09-08 ENCOUNTER — Other Ambulatory Visit: Payer: Self-pay | Admitting: Family Medicine

## 2015-09-08 NOTE — Telephone Encounter (Signed)
Medication filled to pharmacy as requested.   

## 2015-10-17 DIAGNOSIS — L918 Other hypertrophic disorders of the skin: Secondary | ICD-10-CM | POA: Diagnosis not present

## 2015-11-06 ENCOUNTER — Telehealth: Payer: Self-pay | Admitting: Family Medicine

## 2015-11-06 MED ORDER — CITALOPRAM HYDROBROMIDE 40 MG PO TABS: 40.0000 mg | ORAL_TABLET | Freq: Every day | ORAL | Status: DC

## 2015-11-06 MED ORDER — ATORVASTATIN CALCIUM 20 MG PO TABS: 20.0000 mg | ORAL_TABLET | Freq: Every day | ORAL | Status: DC

## 2015-11-06 MED ORDER — LEVOTHYROXINE SODIUM 100 MCG PO TABS: 100.0000 ug | ORAL_TABLET | Freq: Every day | ORAL | Status: DC

## 2015-11-06 NOTE — Telephone Encounter (Signed)
I refilled all of patients medications except for the calcitrol (this is filled by Dr. Elesa MassedWard) and the vitamin D pt should be continuing OTC vitamin D 2000iu daily.

## 2015-11-06 NOTE — Telephone Encounter (Signed)
Pt called in to request a refill on a few medications.    1. Calcitrol 2. Citalopram 3.levothyroxine 4. Atorvastatin 5.Vitamin D   Pharmacy: [facility] on [address]

## 2015-11-06 NOTE — Telephone Encounter (Signed)
Pt would like to transition to [facility] instead. [place]   Pt wanted to go ahead and schedul an established appt incase.   Please advise.

## 2015-11-06 NOTE — Telephone Encounter (Signed)
Ok

## 2015-11-06 NOTE — Telephone Encounter (Signed)
Ok for pt to change providers per PCP

## 2015-12-04 ENCOUNTER — Ambulatory Visit: Payer: BLUE CROSS/BLUE SHIELD | Admitting: Family

## 2016-01-05 ENCOUNTER — Other Ambulatory Visit: Payer: Self-pay | Admitting: Family Medicine

## 2016-01-05 MED ORDER — ESOMEPRAZOLE MAGNESIUM 40 MG PO CPDR: DELAYED_RELEASE_CAPSULE | ORAL | 1 refills | Status: DC

## 2016-04-15 ENCOUNTER — Ambulatory Visit (INDEPENDENT_AMBULATORY_CARE_PROVIDER_SITE_OTHER): Payer: BLUE CROSS/BLUE SHIELD | Admitting: Physician Assistant

## 2016-04-15 ENCOUNTER — Encounter: Payer: Self-pay | Admitting: Physician Assistant

## 2016-04-15 VITALS — BP 133/89 | HR 66 | Temp 98.3°F | Ht 63.0 in | Wt 236.0 lb

## 2016-04-15 DIAGNOSIS — E890 Postprocedural hypothyroidism: Secondary | ICD-10-CM | POA: Diagnosis not present

## 2016-04-15 DIAGNOSIS — E782 Mixed hyperlipidemia: Secondary | ICD-10-CM | POA: Diagnosis not present

## 2016-04-15 DIAGNOSIS — K219 Gastro-esophageal reflux disease without esophagitis: Secondary | ICD-10-CM | POA: Diagnosis not present

## 2016-04-15 DIAGNOSIS — Z0000 Encounter for general adult medical examination without abnormal findings: Secondary | ICD-10-CM | POA: Diagnosis not present

## 2016-04-15 DIAGNOSIS — J310 Chronic rhinitis: Secondary | ICD-10-CM

## 2016-04-15 DIAGNOSIS — F332 Major depressive disorder, recurrent severe without psychotic features: Secondary | ICD-10-CM

## 2016-04-15 MED ORDER — NALTREXONE-BUPROPION HCL ER 8-90 MG PO TB12: ORAL_TABLET | ORAL | 0 refills | Status: DC

## 2016-04-15 NOTE — Progress Notes (Signed)
Allison HousemanMaureen Reyes is a 55 y.o. female who [facility] today to establish care  Post-op Hypothyroidism/Hypoparathyroidism: she is s/p partial thyroidectomy for hyperparathyroidism and takes Calcitriol and Levothyroxine daily. She was last seen by [facility] on 01/31/15. Lab Results  Component Value Date   TSH 1.90 05/19/2015   Lab Results  Component Value Date   PTH 15 05/19/2015   CALCIUM 8.9 05/19/2015   CALCIUM 9.2 05/19/2015    Obesity (BMI 41): patient states that recent changes in her employment and worsening depression have caused weight gain. She is up 3 pounds in the last 11 months. She states that her [place] demands and [place] her from exercising regularly. She would like to try medical management. She has never tried weight loss medication before.  Depression/Insomnia: taking Celexa daily and does not feel this is working for her. Denies suicidal thinking. Takes Ambien prn for insomnia. Family hx is significant for bipolar depression and alcoholism in her mother.  GERD: controlled with Nexium daily.   Preventive care: Pap: s/p hysterectomy for endometriosis Mammogram: overdue, last mammo 10/02/13 Colonoscopy: has had two diagnostics in the past for IBS, would like to do cologuard   Past Medical History:  Diagnosis Date  . Depression   . Hyperlipidemia   . Hyperparathyroidism   . Osteopenia    Past Surgical History:  Procedure Laterality Date  . ABDOMINAL HYSTERECTOMY    . LAPAROSCOPIC ENDOMETRIOSIS FULGURATION    . NASAL SINUS SURGERY  01/03/11  . OOPHORECTOMY    . thyroid removed     1/2   Social History  Substance Use Topics  . Smoking status: Light Tobacco Smoker    Types: Cigarettes  . Smokeless tobacco: Not on file     Comment: 7 cigarettes daily.  . Alcohol use Yes     Comment: social-wine    family history includes Alcohol abuse in her mother; Coronary artery disease in her mother; Depression in her mother; Hypertension in her father and mother; Lung cancer in her father; Stroke in her paternal grandfather.  Review of Systems  Constitutional: Negative for chills, fever and weight loss.       +fatigue  HENT: Positive for congestion.   Eyes: Negative.   Respiratory: Negative.   Cardiovascular: Negative.   Gastrointestinal: Positive for heartburn.  Genitourinary: Negative.   Musculoskeletal: Negative.   Skin: Negative.   Neurological: Negative for dizziness, tingling, tremors, focal weakness, seizures and headaches.  Endo/Heme/Allergies: Positive for environmental allergies.  Psychiatric/Behavioral: Positive for depression. Negative for hallucinations, substance abuse and suicidal ideas.    Medications: Current Outpatient Prescriptions  Medication Sig Dispense Refill  . atorvastatin (LIPITOR) 20 MG tablet Take 1 tablet (20 mg total) by mouth daily. 90 tablet 1  . calcitRIOL (ROCALTROL) 0.5 MCG capsule Take 1 capsule (0.5 mcg total) by mouth 2 (two) times daily. Please take 2 tablets immediately tonight 04/30/13 and then one tablet twice a day (Patient taking differently: Take 0.5 mcg by mouth 3 (three) times daily. Please take 2 tablets immediately tonight 04/30/13 and then one tablet twice a day) 10 capsule 0  . cetirizine (ZYRTEC) 10 MG tablet Take 1 tablet (10 mg total) by mouth daily. 30 tablet 6  . citalopram (CELEXA) 40 MG tablet Take 1 tablet (40 mg total) by mouth daily. 90 tablet 1  . esomeprazole (NEXIUM) 40 MG capsule TAKE 1 CAPSULE DAILY BEFORE BREAKFAST 90 capsule 1  . fluticasone (FLONASE) 50  MCG/ACT nasal spray Place 2 sprays into both nostrils daily. 16 g 2  . levothyroxine (SYNTHROID, LEVOTHROID) 100 MCG tablet Take 1 tablet (100 mcg total) by mouth daily before breakfast. 90 tablet 1  . ROPINIRole HCl (REQUIP PO) Take by mouth daily as needed (RLS).    .  Vitamin D, Ergocalciferol, (DRISDOL) 50000 units CAPS capsule Take 1 capsule (50,000 Units total) by mouth every 7 (seven) days. 12 capsule 0  . zolpidem (AMBIEN) 10 MG tablet TAKE 1 TABLET BY MOUTH AT BEDTIME AS NEEDED. 30 tablet 3  . Naltrexone-Bupropion HCl ER 8-90 MG TB12 1 tab daily for week 1, then 1 tab BID for week 2, then 2 tab PO qAM and 1 tab PO qPM for week 3, then 2 tabs BID. 80 tablet 0   No current facility-administered medications for this visit.    No Known Allergies  Health Maintenance Health Maintenance  Topic Date Due  . Hepatitis C Screening  1960/10/01  . HIV Screening  09/02/1975  . COLONOSCOPY  07/29/2011  . MAMMOGRAM  10/13/2015  . INFLUENZA VACCINE  11/28/2015  . TETANUS/TDAP  08/12/2021     Objective:  BP 133/89   Pulse 66   Temp 98.3 F (36.8 C) (Oral)   Ht 5\' 3"  (1.6 m)   Wt 236 lb (107 kg)   BMI 41.81 kg/m  Gen: Alert, obese, cooperative not ill-appearing, no distress HEENT: Extraocular movements intact, normal conjunctiva, TM's clear, oropharynx clear, moist mucus membranes, no thyromegaly or tenderness Lungs: Normal work of breathing, clear to auscultation bilaterally Heart: Normal rate, regular rhythm, s1 and s2 distinct, no murmurs, clicks or rubs appreciated on this exam Abd: Soft. Nondistended, Nontender Extremities: distal pulses intact, no peripheral edema Skin: warm and dry, no rashes or lesions on exposed skin Psych: normal affect, normal speech and thought content  Depression screen [facility] 2/[facility] 04/15/2016  Decreased Interest 2  Down, Depressed, Hopeless 2  PHQ - 2 Score 4  Altered sleeping 3  Tired, decreased energy 3  Change in appetite 3  Feeling bad or failure about yourself  3  Trouble concentrating 2  Moving slowly or fidgety/restless 0  Suicidal thoughts 0  PHQ-9 Score 18    Assessment and Plan: 55 y.o. female with   1. Encounter for preventive health examination - TSH - VITAMIN D 25 Hydroxy (Vit-D Deficiency,  Fractures) - Lipid panel - Hemoglobin A1c - CBC - Comprehensive metabolic panel - Hepatitis C antibody - HIV antibody - MM Digital Screening - Referral for Cologuard placed   2. Post-operative hypothyroidism - PTH, Intact and Calcium - TSH  3. Gastroesophageal reflux disease, esophagitis presence not specified - discussed switching to Ranitidine. Will reassess at next visit  4. Mixed hyperlipidemia - Lipid panel  5. Morbid obesity (HCC) - Naltrexone-Bupropion HCl ER 8-90 MG TB12; 1 tab daily for week 1, then 1 tab BID for week 2, then 2 tab PO qAM and 1 tab PO qPM for week 3, then 2 tabs BID.  Dispense: 80 tablet; Refill: 0  6. Severe episode of recurrent major depressive disorder, without psychotic features (HCC) - cont Celexa - follow-up in 1 month to see if Bupropion is providing additional benefit - Naltrexone-Bupropion HCl ER 8-90 MG TB12; 1 tab daily for week 1, then 1 tab BID for week 2, then 2 tab PO qAM and 1 tab PO qPM for week 3, then 2 tabs BID.  Dispense: 80 tablet; Refill: 0  7. Chronic rhinitis, unspecified type -  Ambulatory referral to ENT - cont Zyrtec and Flonase daily  Patient education and anticipatory guidance given Patient agrees with treatment plan Follow-up in 1 month or sooner as needed  Levonne Hubert PA-C

## 2016-04-15 NOTE — Patient Instructions (Addendum)
Please return in one month to see how you are doing on the Contrave I've ordered your mammogram and you can have it completed here at [place] has been ordered and they will be mailing you a kit soon Fasting labs (nothing to eat or drink except water/black coffee at least 8 hours before)  Take Cetirizine (zyrtec) and Flonase (1 spray in each nostril) daily Thank you and happy holidays!

## 2016-04-16 ENCOUNTER — Ambulatory Visit (INDEPENDENT_AMBULATORY_CARE_PROVIDER_SITE_OTHER): Payer: BLUE CROSS/BLUE SHIELD

## 2016-04-16 DIAGNOSIS — Z1231 Encounter for screening mammogram for malignant neoplasm of breast: Secondary | ICD-10-CM | POA: Diagnosis not present

## 2016-04-16 LAB — CBC
HCT: 38.9 % (ref 35.0–45.0)
Hemoglobin: 13 g/dL (ref 11.7–15.5)
MCH: 30.5 pg (ref 27.0–33.0)
MCHC: 33.4 g/dL (ref 32.0–36.0)
MCV: 91.3 fL (ref 80.0–100.0)
MPV: 9.7 fL (ref 7.5–12.5)
Platelets: 300 10*3/uL (ref 140–400)
RBC: 4.26 MIL/uL (ref 3.80–5.10)
RDW: 13.6 % (ref 11.0–15.0)
WBC: 5.4 10*3/uL (ref 3.8–10.8)

## 2016-04-16 LAB — LIPID PANEL
Cholesterol: 126 mg/dL (ref <200)
HDL: 37 mg/dL — ABNORMAL LOW (ref >50)
LDL Cholesterol: 58 mg/dL (ref <100)
Total CHOL/HDL Ratio: 3.4 Ratio (ref <5.0)
Triglycerides: 153 mg/dL — ABNORMAL HIGH (ref <150)
VLDL: 31 mg/dL — ABNORMAL HIGH (ref <30)

## 2016-04-16 LAB — HEMOGLOBIN A1C
Hgb A1c MFr Bld: 5.6 % (ref <5.7)
Mean Plasma Glucose: 114 mg/dL

## 2016-04-16 LAB — COMPREHENSIVE METABOLIC PANEL
ALT: 39 U/L — ABNORMAL HIGH (ref 6–29)
AST: 21 U/L (ref 10–35)
Albumin: 4 g/dL (ref 3.6–5.1)
Alkaline Phosphatase: 93 U/L (ref 33–130)
BUN: 14 mg/dL (ref 7–25)
CO2: 26 mmol/L (ref 20–31)
Calcium: 8.6 mg/dL (ref 8.6–10.4)
Chloride: 104 mmol/L (ref 98–110)
Creat: 1.09 mg/dL — ABNORMAL HIGH (ref 0.50–1.05)
Glucose, Bld: 98 mg/dL (ref 65–99)
Potassium: 4.2 mmol/L (ref 3.5–5.3)
Sodium: 142 mmol/L (ref 135–146)
Total Bilirubin: 0.7 mg/dL (ref 0.2–1.2)
Total Protein: 6.5 g/dL (ref 6.1–8.1)

## 2016-04-16 LAB — TSH: TSH: 2.48 mIU/L

## 2016-04-17 ENCOUNTER — Encounter: Payer: Self-pay | Admitting: Physician Assistant

## 2016-04-17 ENCOUNTER — Other Ambulatory Visit: Payer: Self-pay | Admitting: Physician Assistant

## 2016-04-17 DIAGNOSIS — R7401 Elevation of levels of liver transaminase levels: Secondary | ICD-10-CM

## 2016-04-17 DIAGNOSIS — R740 Nonspecific elevation of levels of transaminase and lactic acid dehydrogenase [LDH]: Principal | ICD-10-CM

## 2016-04-17 LAB — PTH, INTACT AND CALCIUM
Calcium: 8.6 mg/dL (ref 8.6–10.4)
PTH: 18 pg/mL (ref 14–64)

## 2016-04-17 LAB — VITAMIN D 25 HYDROXY (VIT D DEFICIENCY, FRACTURES): Vit D, 25-Hydroxy: 50 ng/mL (ref 30–100)

## 2016-04-17 LAB — HEPATITIS C ANTIBODY: HCV Ab: NEGATIVE

## 2016-04-17 LAB — HIV ANTIBODY (ROUTINE TESTING W REFLEX): HIV 1&2 Ab, 4th Generation: NONREACTIVE

## 2016-04-17 NOTE — Progress Notes (Unsigned)
Patient had a lone elevated ALT on recent labs. Hep C screening was negative. She is on Lipitor 20mg  daily. In reviewing her previous labs, it has been on the high end of normal or slightly elevated in the past. I suspect this might be early NASH (BMI . Instructed her to avoid herbal supplements, Tylenol products, and alcohol for the next 2-3 weeks and have ordered a hepatic function panel.  Lab Results  Component Value Date   ALT 39 (H) 04/15/2016   AST 21 04/15/2016   ALKPHOS 93 04/15/2016   BILITOT 0.7 04/15/2016    Levonne Hubertharley E. Inika Bellanger PA-C

## 2016-05-16 ENCOUNTER — Ambulatory Visit: Payer: BLUE CROSS/BLUE SHIELD | Admitting: Physician Assistant

## 2016-06-01 ENCOUNTER — Other Ambulatory Visit: Payer: Self-pay | Admitting: Family Medicine

## 2016-06-03 DIAGNOSIS — Z1211 Encounter for screening for malignant neoplasm of colon: Secondary | ICD-10-CM | POA: Diagnosis not present

## 2016-06-03 DIAGNOSIS — Z1212 Encounter for screening for malignant neoplasm of rectum: Secondary | ICD-10-CM | POA: Diagnosis not present

## 2016-06-03 LAB — COLOGUARD

## 2016-06-09 ENCOUNTER — Other Ambulatory Visit: Payer: Self-pay | Admitting: Family Medicine

## 2016-06-14 ENCOUNTER — Telehealth: Payer: Self-pay

## 2016-06-14 NOTE — Telephone Encounter (Signed)
Pt.notified

## 2016-06-14 NOTE — Telephone Encounter (Signed)
-----   Message from [facility] [name], [place] sent at 06/12/2016  5:22 PM EST ----- Let patient know her Cologuard was negative. Recommend repeat Cologuard in 3 years.

## 2016-06-15 ENCOUNTER — Other Ambulatory Visit: Payer: Self-pay | Admitting: Family Medicine

## 2016-06-19 ENCOUNTER — Ambulatory Visit: Payer: BLUE CROSS/BLUE SHIELD | Admitting: Physician Assistant

## 2016-07-08 ENCOUNTER — Other Ambulatory Visit: Payer: Self-pay | Admitting: Physician Assistant

## 2016-08-02 ENCOUNTER — Other Ambulatory Visit: Payer: Self-pay | Admitting: Physician Assistant

## 2016-08-06 ENCOUNTER — Ambulatory Visit (INDEPENDENT_AMBULATORY_CARE_PROVIDER_SITE_OTHER): Payer: BLUE CROSS/BLUE SHIELD | Admitting: Family Medicine

## 2016-08-06 VITALS — BP 144/72 | HR 78 | Temp 98.0°F | Wt 237.0 lb

## 2016-08-06 DIAGNOSIS — R300 Dysuria: Secondary | ICD-10-CM | POA: Diagnosis not present

## 2016-08-06 LAB — POCT URINALYSIS DIPSTICK
Bilirubin, UA: NEGATIVE
Glucose, UA: NEGATIVE
Ketones, UA: NEGATIVE
Nitrite, UA: POSITIVE
Protein, UA: NEGATIVE
Spec Grav, UA: <=1.005 — AB (ref 1.010–1.025)
Urobilinogen, UA: 0.2 E.U./dL
pH, UA: 6 (ref 5.0–8.0)

## 2016-08-06 MED ORDER — CALCITRIOL 0.5 MCG PO CAPS: ORAL_CAPSULE | ORAL | 0 refills | Status: DC

## 2016-08-06 MED ORDER — ESOMEPRAZOLE MAGNESIUM 40 MG PO CPDR
40.0000 mg | DELAYED_RELEASE_CAPSULE | Freq: Every day | ORAL | 0 refills | Status: DC
Start: 2016-08-06 — End: 2016-11-03

## 2016-08-06 MED ORDER — CEPHALEXIN 500 MG PO CAPS: 500.0000 mg | ORAL_CAPSULE | Freq: Two times a day (BID) | ORAL | 0 refills | Status: DC

## 2016-08-06 NOTE — Progress Notes (Signed)
Allison Reyes is a 56 y.o. female who presents to [name]: Primary Care [facility] Medicine today for section. Patient notes a 2 day history of urinary frequency urgency and dysuria. No fevers chills vomiting or diarrhea. She has tried one of her friends unknown medications which did not help much. She feels well otherwise.   Past Medical History:  Diagnosis Date  . Depression   . GERD (gastroesophageal reflux disease)   . Hyperlipidemia   . Hyperparathyroidism   . Osteopenia    Past Surgical History:  Procedure Laterality Date  . ABDOMINAL HYSTERECTOMY    . LAPAROSCOPIC ENDOMETRIOSIS FULGURATION    . NASAL SINUS SURGERY  01/03/11  . OOPHORECTOMY    . thyroid removed     1/2   Social History  Substance Use Topics  . Smoking status: Light Tobacco Smoker    Types: Cigarettes  . Smokeless tobacco: Not on file     Comment: 7 cigarettes daily.  . Alcohol use Yes     Comment: social-wine   family history includes Alcohol abuse in her mother; Coronary artery disease in her mother; Depression in her mother; Hypertension in her father and mother; Lung cancer in her father; Stroke in her paternal grandfather.  ROS as above:  Medications: Current Outpatient Prescriptions  Medication Sig Dispense Refill  . atorvastatin (LIPITOR) 20 MG tablet TAKE 1 TABLET(20 MG) BY MOUTH DAILY 90 tablet 0  . calcitRIOL (ROCALTROL) 0.5 MCG capsule Take one capsule in the morning and take two capsules in the evenings.  NEED FOLLOW UP APPOINTMENT FOR MORE REFILLS 270 capsule 0  . cetirizine (ZYRTEC) 10 MG tablet Take 1 tablet (10 mg total) by mouth daily. 30 tablet 6  . citalopram (CELEXA) 40 MG tablet TAKE 1 TABLET(40 MG) BY MOUTH DAILY 90 tablet 0  . esomeprazole (NEXIUM) 40 MG capsule Take 1 capsule (40 mg total) by mouth daily. NEED FOLLOW UP APPOINTMENT FOR MORE REFILLS 90 capsule 0  . fluticasone (FLONASE)  50 MCG/ACT nasal spray Place 2 sprays into both nostrils daily. 16 g 2  . levothyroxine (SYNTHROID, LEVOTHROID) 100 MCG tablet TAKE 1 TABLET(100 MCG) BY MOUTH DAILY BEFORE BREAKFAST 90 tablet 0  . Naltrexone-Bupropion HCl ER 8-90 MG TB12 1 tab daily for week 1, then 1 tab BID for week 2, then 2 tab PO qAM and 1 tab PO qPM for week 3, then 2 tabs BID. 80 tablet 0  . ROPINIRole HCl (REQUIP PO) Take by mouth daily as needed (RLS).    . Vitamin D, Ergocalciferol, (DRISDOL) 50000 units CAPS capsule Take 1 capsule (50,000 Units total) by mouth every 7 (seven) days. 12 capsule 0  . zolpidem (AMBIEN) 10 MG tablet TAKE 1 TABLET BY MOUTH AT BEDTIME AS NEEDED. 30 tablet 3  . cephALEXin (KEFLEX) 500 MG capsule Take 1 capsule (500 mg total) by mouth 2 (two) times daily. 14 capsule 0   No current facility-administered medications for this visit.    No Known Allergies  Health Maintenance Health Maintenance  Topic Date Due  . INFLUENZA VACCINE  11/27/2016  . MAMMOGRAM  04/16/2018  . Fecal DNA (Cologuard)  06/06/2019  . TETANUS/TDAP  08/12/2021  . Hepatitis C Screening  Completed  . HIV Screening  Completed     Exam:  BP (!) 144/72   Pulse 78   Temp 98 F (36.7 C) (Oral)   Wt 237 lb (107.5 kg)   BMI 41.98 kg/m  Gen: Well NAD  HEENT: EOMI,  MMM Lungs: Normal work of breathing. CTABL Heart: RRR no MRG Abd: NABS, Soft. Nondistended, Nontender No CVA angle tenderness to percussion Exts: Brisk capillary refill, warm and well perfused.    Results for orders placed or performed in visit on 08/06/16 (from the past 72 hour(s))  POCT Urinalysis Dipstick     Status: Abnormal   Collection Time: 08/06/16  4:34 PM  Result Value Ref Range   Color, UA yellow    Clarity, UA clear    Glucose, UA neg    Bilirubin, UA neg    Ketones, UA neg    Spec Grav, UA <=1.005 (A) 1.010 - 1.025   Blood, UA moderate    pH, UA 6.0 5.0 - 8.0   Protein, UA neg    Urobilinogen, UA 0.2 0.2 or 1.0 E.U./dL   Nitrite, UA  pos    Leukocytes, UA moderate (2+) (A) small (1+)   No results found.    Assessment and Plan: 56 y.o. female with Urinary tract infection. Culture pending. Empiric treatment with Keflex.  Calcitriol and Nexium refilled follow-up with PCP.   Orders Placed This Encounter  Procedures  . Urine Culture  . POCT Urinalysis Dipstick   Meds ordered this encounter  Medications  . esomeprazole (NEXIUM) 40 MG capsule    Sig: Take 1 capsule (40 mg total) by mouth daily. NEED FOLLOW UP APPOINTMENT FOR MORE REFILLS    Dispense:  90 capsule    Refill:  0  . calcitRIOL (ROCALTROL) 0.5 MCG capsule    Sig: Take one capsule in the morning and take two capsules in the evenings.  NEED FOLLOW UP APPOINTMENT FOR MORE REFILLS    Dispense:  270 capsule    Refill:  0  . cephALEXin (KEFLEX) 500 MG capsule    Sig: Take 1 capsule (500 mg total) by mouth 2 (two) times daily.    Dispense:  14 capsule    Refill:  0     Discussed warning signs or symptoms. Please see discharge instructions. Patient expresses understanding.

## 2016-08-06 NOTE — Patient Instructions (Signed)
Thank you for coming in today. Take keflex twice daily for urinary infection.  Return sooner if needed.  Tonight take over the counter AZO.  If your belly pain worsens, or you have high fever, bad vomiting, blood in your stool or black tarry stool go to the Emergency Room.    Urinary Tract Infection, Adult A urinary tract infection (UTI) is an infection of any part of the urinary tract, which includes the kidneys, ureters, bladder, and urethra. These organs make, store, and get rid of urine in the body. UTI can be a bladder infection (cystitis) or kidney infection (pyelonephritis). What are the causes? This infection may be caused by fungi, viruses, or bacteria. Bacteria are the most common cause of UTIs. This condition can also be caused by repeated incomplete emptying of the bladder during urination. What increases the risk? This condition is more likely to develop if:  You ignore your need to urinate or hold urine for long periods of time.  You do not empty your bladder completely during urination.  You wipe back to front after urinating or having a bowel movement, if you are female.  You are uncircumcised, if you are female.  You are constipated.  You have a urinary catheter that stays in place (indwelling).  You have a weak defense (immune) system.  You have a medical condition that affects your bowels, kidneys, or bladder.  You have diabetes.  You take antibiotic medicines frequently or for long periods of time, and the antibiotics no longer work well against certain types of infections (antibiotic resistance).  You take medicines that irritate your urinary tract.  You are exposed to chemicals that irritate your urinary tract.  You are female. What are the signs or symptoms? Symptoms of this condition include:  Fever.  Frequent urination or passing small amounts of urine frequently.  Needing to urinate urgently.  Pain or burning with urination.  Urine that smells  bad or unusual.  Cloudy urine.  Pain in the lower abdomen or back.  Trouble urinating.  Blood in the urine.  Vomiting or being less hungry than normal.  Diarrhea or abdominal pain.  Vaginal discharge, if you are female. How is this diagnosed? This condition is diagnosed with a medical history and physical exam. You will also need to provide a urine sample to test your urine. Other tests may be done, including:  Blood tests.  Sexually transmitted disease (STD) testing. If you have had more than one UTI, a cystoscopy or imaging studies may be done to determine the cause of the infections. How is this treated? Treatment for this condition often includes a combination of two or more of the following:  Antibiotic medicine.  Other medicines to treat less common causes of UTI.  Over-the-counter medicines to treat pain.  Drinking enough water to stay hydrated. Follow these instructions at home:  Take over-the-counter and prescription medicines only as told by your health care provider.  If you were prescribed an antibiotic, take it as told by your health care provider. Do not stop taking the antibiotic even if you start to feel better.  Avoid alcohol, caffeine, tea, and carbonated beverages. They can irritate your bladder.  Drink enough fluid to keep your urine clear or pale yellow.  Keep all follow-up visits as told by your health care provider. This is important.  Make sure to:  Empty your bladder often and completely. Do not hold urine for long periods of time.  Empty your bladder before and after  sex.  Wipe from front to back after a bowel movement if you are female. Use each tissue one time when you wipe. Contact a health care provider if:  You have back pain.  You have a fever.  You feel nauseous or vomit.  Your symptoms do not get better after 3 days.  Your symptoms go away and then return. Get help right away if:  You have severe back pain or lower  abdominal pain.  You are vomiting and cannot keep down any medicines or water. This information is not intended to replace advice given to you by your health care provider. Make sure you discuss any questions you have with your health care provider. Document Released: 01/23/2005 Document Revised: 09/27/2015 Document Reviewed: 03/06/2015 Elsevier Interactive Patient Education  2017 [employer].

## 2016-08-08 LAB — URINE CULTURE: Organism ID, Bacteria: NO GROWTH

## 2016-08-31 ENCOUNTER — Other Ambulatory Visit: Payer: Self-pay | Admitting: Physician Assistant

## 2016-09-30 ENCOUNTER — Other Ambulatory Visit: Payer: Self-pay | Admitting: Physician Assistant

## 2016-11-03 ENCOUNTER — Other Ambulatory Visit: Payer: Self-pay | Admitting: Family Medicine

## 2016-11-05 ENCOUNTER — Other Ambulatory Visit: Payer: Self-pay | Admitting: Physician Assistant

## 2016-11-06 ENCOUNTER — Ambulatory Visit (INDEPENDENT_AMBULATORY_CARE_PROVIDER_SITE_OTHER): Payer: BLUE CROSS/BLUE SHIELD | Admitting: Physician Assistant

## 2016-11-06 ENCOUNTER — Encounter: Payer: Self-pay | Admitting: Physician Assistant

## 2016-11-06 VITALS — BP 122/75 | HR 73 | Wt 235.0 lb

## 2016-11-06 DIAGNOSIS — K219 Gastro-esophageal reflux disease without esophagitis: Secondary | ICD-10-CM | POA: Diagnosis not present

## 2016-11-06 DIAGNOSIS — E782 Mixed hyperlipidemia: Secondary | ICD-10-CM

## 2016-11-06 DIAGNOSIS — F5105 Insomnia due to other mental disorder: Secondary | ICD-10-CM | POA: Insufficient documentation

## 2016-11-06 DIAGNOSIS — E890 Postprocedural hypothyroidism: Secondary | ICD-10-CM | POA: Diagnosis not present

## 2016-11-06 DIAGNOSIS — G4700 Insomnia, unspecified: Secondary | ICD-10-CM | POA: Insufficient documentation

## 2016-11-06 DIAGNOSIS — J309 Allergic rhinitis, unspecified: Secondary | ICD-10-CM

## 2016-11-06 DIAGNOSIS — E559 Vitamin D deficiency, unspecified: Secondary | ICD-10-CM | POA: Insufficient documentation

## 2016-11-06 DIAGNOSIS — Z8639 Personal history of other endocrine, nutritional and metabolic disease: Secondary | ICD-10-CM | POA: Diagnosis not present

## 2016-11-06 DIAGNOSIS — M8580 Other specified disorders of bone density and structure, unspecified site: Secondary | ICD-10-CM | POA: Diagnosis not present

## 2016-11-06 DIAGNOSIS — F332 Major depressive disorder, recurrent severe without psychotic features: Secondary | ICD-10-CM

## 2016-11-06 DIAGNOSIS — Z8739 Personal history of other diseases of the musculoskeletal system and connective tissue: Secondary | ICD-10-CM | POA: Insufficient documentation

## 2016-11-06 MED ORDER — ATORVASTATIN CALCIUM 20 MG PO TABS: 20.0000 mg | ORAL_TABLET | Freq: Every day | ORAL | 3 refills | Status: DC

## 2016-11-06 MED ORDER — ESOMEPRAZOLE MAGNESIUM 40 MG PO CPDR: DELAYED_RELEASE_CAPSULE | ORAL | 3 refills | Status: DC

## 2016-11-06 MED ORDER — VITAMIN D (ERGOCALCIFEROL) 1.25 MG (50000 UNIT) PO CAPS
50000.0000 [IU] | ORAL_CAPSULE | ORAL | 0 refills | Status: DC
Start: 2016-11-06 — End: 2017-02-17

## 2016-11-06 MED ORDER — ZOLPIDEM TARTRATE 10 MG PO TABS: 10.0000 mg | ORAL_TABLET | Freq: Every evening | ORAL | 0 refills | Status: DC | PRN

## 2016-11-06 MED ORDER — CALCITRIOL 0.5 MCG PO CAPS: ORAL_CAPSULE | ORAL | 3 refills | Status: DC

## 2016-11-06 MED ORDER — CETIRIZINE HCL 10 MG PO TABS: 10.0000 mg | ORAL_TABLET | Freq: Every day | ORAL | 3 refills | Status: DC

## 2016-11-06 MED ORDER — CITALOPRAM HYDROBROMIDE 40 MG PO TABS: 40.0000 mg | ORAL_TABLET | Freq: Every day | ORAL | 3 refills | Status: DC

## 2016-11-06 MED ORDER — LEVOTHYROXINE SODIUM 100 MCG PO TABS: 100.0000 ug | ORAL_TABLET | Freq: Every day | ORAL | 3 refills | Status: DC

## 2016-11-06 MED ORDER — BUPROPION HCL ER (XL) 150 MG PO TB24: 150.0000 mg | ORAL_TABLET | ORAL | 0 refills | Status: DC

## 2016-11-06 NOTE — Patient Instructions (Signed)
If you experience any worsening suicidal thoughts, go to the nearest emergency room or call 911 National Suicide Hotline 1-800-SUICIDE  [facility] Crisis Hotline [phone]

## 2016-11-06 NOTE — Progress Notes (Signed)
HPI:                                                                Allison Reyes is a 56 y.o. female who presents to [name]: [facility] today for medication refills  Depression/Anxiety: taking Celexa 40mg  without difficulty. Uses Ambien 1-2 nights per week for insomnia. Continues to endorse depressive symptoms and anhedonia. Reports she has been out of work for over a year and will be starting a fulltime position next month. Denies symptoms of mania/hypomania. Endorses suicidal thinking; denies plan, self-harm, or active SI today. Denies auditory/visual hallucinations.  Post-op Hypothyroidism/Hx of hyperparathyroidism: she is s/p partial thyroidectomy for hyperparathyroidism and takes Calcitriol and Levothyroxine daily. She was last seen by [facility] on 01/31/15.  GERD: controlled with Nexium daily. Denies melena, hematochezia.  HLD: takes Lipitor 20mg  daily. Denies myalgias  Allergic rhinitis: controlled with Zyrtec daily. Denies wheezing, shortness of breath.  Past Medical History:  Diagnosis Date  . Depression   . GERD (gastroesophageal reflux disease)   . Hyperlipidemia   . Hyperparathyroidism   . Osteopenia    Past Surgical History:  Procedure Laterality Date  . ABDOMINAL HYSTERECTOMY    . LAPAROSCOPIC ENDOMETRIOSIS FULGURATION    . NASAL SINUS SURGERY  01/03/11  . OOPHORECTOMY    . thyroid removed     1/2   Social History  Substance Use Topics  . Smoking status: Light Tobacco Smoker    Types: Cigarettes  . Smokeless tobacco: Not on file     Comment: 7 cigarettes daily.  . Alcohol use Yes     Comment: social-wine   family history includes Alcohol abuse in her mother; Coronary artery disease in her mother; Depression in her mother; Hypertension in her father and mother; Lung cancer in her father; Stroke in her paternal grandfather.  ROS: negative except as noted in the HPI  Medications: Current Outpatient  Prescriptions  Medication Sig Dispense Refill  . atorvastatin (LIPITOR) 20 MG tablet Take 1 tablet (20 mg total) by mouth daily. Due for follow up visit 15 tablet 0  . calcitRIOL (ROCALTROL) 0.5 MCG capsule Take one capsule in the morning and take two capsules in the evenings.  NEED FOLLOW UP APPOINTMENT FOR MORE REFILLS 270 capsule 0  . cetirizine (ZYRTEC) 10 MG tablet Take 1 tablet (10 mg total) by mouth daily. 30 tablet 6  . citalopram (CELEXA) 40 MG tablet Take 1 tablet (40 mg total) by mouth daily. Due for follow up visit 15 tablet 0  . esomeprazole (NEXIUM) 40 MG capsule TAKE 1 CAPSULE(40 MG) BY MOUTH DAILY. NEED FOLLOW UP APPOINTMENT FOR MORE REFILLS 30 capsule 0  . fluticasone (FLONASE) 50 MCG/ACT nasal spray Place 2 sprays into both nostrils daily. 16 g 2  . levothyroxine (SYNTHROID, LEVOTHROID) 100 MCG tablet Take 1 tablet (100 mcg total) by mouth daily before breakfast. Due for follow up visit 15 tablet 0  . Naltrexone-Bupropion HCl ER 8-90 MG TB12 1 tab daily for week 1, then 1 tab BID for week 2, then 2 tab PO qAM and 1 tab PO qPM for week 3, then 2 tabs BID. 80 tablet 0  . ROPINIRole HCl (REQUIP PO) Take by mouth daily as needed (RLS).    Marland Kitchen  Vitamin D, Ergocalciferol, (DRISDOL) 50000 units CAPS capsule Take 1 capsule (50,000 Units total) by mouth every 7 (seven) days. 12 capsule 0  . zolpidem (AMBIEN) 10 MG tablet TAKE 1 TABLET BY MOUTH AT BEDTIME AS NEEDED. 30 tablet 3   No current facility-administered medications for this visit.    No Known Allergies   Objective:  BP 122/75   Pulse 73   Wt 235 lb (106.6 kg)   BMI 41.63 kg/m  Gen: well-groomed, cooperative, not ill-appearing, no distress HEENT: normal conjunctiva, well-healed surgical scar superior to the jugular notch, neck supple, trachea midline Pulm: Normal work of breathing, normal phonation, clear to auscultation bilaterally, no wheezes, rales or rhonchi CV: Normal rate, regular rhythm, s1 and s2 distinct, no murmurs,  clicks or rubs  Neuro: alert and oriented x 3, EOM's intact, no tremor MSK: moving all extremities, normal gait and station, no peripheral edema Skin: warm, dry, intact; no rashes or lesions on exposed skin, no cyanosis Psych: good eye contact, depressed mood, affect mood-congruent, normal speech, normal thought content, good insight   No results found for this or any previous visit (from the past 72 hour(s)). No results found.  Depression screen [facility] 2/9 11/06/2016 04/15/2016  Decreased Interest 2 2  Down, Depressed, Hopeless 2 2  PHQ - 2 Score 4 4  Altered sleeping 3 3  Tired, decreased energy 3 3  Change in appetite 3 3  Feeling bad or failure about yourself  3 3  Trouble concentrating 2 2  Moving slowly or fidgety/restless 1 0  Suicidal thoughts 1 0  PHQ-9 Score 20 18   GAD 7 : Generalized Anxiety Score 11/06/2016  Nervous, Anxious, on Edge 2  Control/stop worrying 2  Worry too much - different things 3  Trouble relaxing 1  Restless 1  Easily annoyed or irritable 2  Afraid - awful might happen 3  Total GAD 7 Score 14      Assessment and Plan: 56 y.o. female with   1. Severe episode of recurrent major depressive disorder, without psychotic features (HCC) - PHQ9 score 20, severe - Augmenting Celexa with Wellbutrin - Patient contracted for safety. No active SI. States "it's not going to happen; just thoughts" - citalopram (CELEXA) 40 MG tablet; Take 1 tablet (40 mg total) by mouth daily.  Dispense: 90 tablet; Refill: 3 - buPROPion (WELLBUTRIN XL) 150 MG 24 hr tablet; Take 1 tablet (150 mg total) by mouth every morning.  Dispense: 90 tablet; Refill: 0  2. History of hyperparathyroidism - calcitRIOL (ROCALTROL) 0.5 MCG capsule; Take one capsule in the morning and take two capsules in the evenings.  Dispense: 270 capsule; Refill: 3  3. Osteopenia, unspecified location - due for repeat DEXA - calcitRIOL (ROCALTROL) 0.5 MCG capsule; Take one capsule in the morning and take  two capsules in the evenings.  Dispense: 270 capsule; Refill: 3 - Vitamin D, Ergocalciferol, (DRISDOL) 50000 units CAPS capsule; Take 1 capsule (50,000 Units total) by mouth every 7 (seven) days.  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  Dispense: 45 tablet; Refill: 0  5. Post-surgical hypothyroidism - levothyroxine (SYNTHROID, LEVOTHROID) 100 MCG tablet; Take 1 tablet (100 mcg total) by mouth daily before breakfast.  Dispense: 90 tablet; Refill: 3  6. Gastroesophageal reflux disease, esophagitis presence not specified - discussed risk of QT prolongation with  co-adminstration of Celexa and Nexium. Patient states she has been on this regimen for years and does not want to change medications. - Will check ECG at her next appointment - esomeprazole (NEXIUM) 40 MG capsule; TAKE 1 CAPSULE(40 MG) BY MOUTH DAILY.  Dispense: 90 capsule; Refill: 3  7. Chronic allergic rhinitis - cetirizine (ZYRTEC) 10 MG tablet; Take 1 tablet (10 mg total) by mouth daily.  Dispense: 90 tablet; Refill: 3  8. Mixed hyperlipidemia Lab Results  Component Value Date   CHOL 126 04/15/2016   HDL 37 (L) 04/15/2016   LDLCALC 58 04/15/2016   LDLDIRECT 165.8 04/25/2014   TRIG 153 (H) 04/15/2016   CHOLHDL 3.4 04/15/2016  - atorvastatin (LIPITOR) 20 MG tablet; Take 1 tablet (20 mg total) by mouth daily.  Dispense: 90 tablet; Refill: 3  9. Vitamin D deficiency - Vitamin D, Ergocalciferol, (DRISDOL) 50000 units CAPS capsule; Take 1 capsule (50,000 Units total) by mouth every 7 (seven) days.  Dispense: 16 capsule; Refill: 0  Patient education and anticipatory guidance given Patient agrees with treatment plan Follow-up in 3 months for CPE w/fasting labs or sooner as needed if symptoms worsen or fail to improve  Levonne Hubert PA-C

## 2016-11-12 ENCOUNTER — Ambulatory Visit (INDEPENDENT_AMBULATORY_CARE_PROVIDER_SITE_OTHER): Payer: BLUE CROSS/BLUE SHIELD

## 2016-11-12 DIAGNOSIS — Z13820 Encounter for screening for osteoporosis: Secondary | ICD-10-CM | POA: Diagnosis not present

## 2016-11-12 DIAGNOSIS — M8580 Other specified disorders of bone density and structure, unspecified site: Secondary | ICD-10-CM

## 2016-11-26 DIAGNOSIS — Z780 Asymptomatic menopausal state: Secondary | ICD-10-CM | POA: Diagnosis not present

## 2016-11-26 DIAGNOSIS — M85851 Other specified disorders of bone density and structure, right thigh: Secondary | ICD-10-CM | POA: Diagnosis not present

## 2016-11-27 NOTE — Progress Notes (Signed)
Hi Allison Reyes,  Your bone density scan was normal Recommend calcium and vitamin D supplementation 600-400mg  twice a day Also recommend regular weight bearing exercise to prevent osteoporosis  Repeat your bone density scan in 2 years  Best, Vinetta Bergamoharley

## 2016-12-05 ENCOUNTER — Telehealth: Payer: Self-pay

## 2016-12-05 NOTE — Telephone Encounter (Signed)
Pt called back for bone density results.  Given.

## 2017-02-06 ENCOUNTER — Encounter: Payer: BLUE CROSS/BLUE SHIELD | Admitting: Physician Assistant

## 2017-02-14 ENCOUNTER — Ambulatory Visit (INDEPENDENT_AMBULATORY_CARE_PROVIDER_SITE_OTHER): Payer: BLUE CROSS/BLUE SHIELD | Admitting: Physician Assistant

## 2017-02-14 ENCOUNTER — Encounter: Payer: Self-pay | Admitting: Physician Assistant

## 2017-02-14 VITALS — BP 114/79 | HR 71 | Temp 97.6°F | Wt 241.0 lb

## 2017-02-14 DIAGNOSIS — R0683 Snoring: Secondary | ICD-10-CM | POA: Diagnosis not present

## 2017-02-14 DIAGNOSIS — Z5181 Encounter for therapeutic drug level monitoring: Secondary | ICD-10-CM | POA: Diagnosis not present

## 2017-02-14 DIAGNOSIS — E890 Postprocedural hypothyroidism: Secondary | ICD-10-CM

## 2017-02-14 DIAGNOSIS — Z9189 Other specified personal risk factors, not elsewhere classified: Secondary | ICD-10-CM | POA: Diagnosis not present

## 2017-02-14 DIAGNOSIS — Z8639 Personal history of other endocrine, nutritional and metabolic disease: Secondary | ICD-10-CM | POA: Diagnosis not present

## 2017-02-14 DIAGNOSIS — R7401 Elevation of levels of liver transaminase levels: Secondary | ICD-10-CM

## 2017-02-14 DIAGNOSIS — F3341 Major depressive disorder, recurrent, in partial remission: Secondary | ICD-10-CM | POA: Diagnosis not present

## 2017-02-14 DIAGNOSIS — Z1321 Encounter for screening for nutritional disorder: Secondary | ICD-10-CM

## 2017-02-14 DIAGNOSIS — Z1231 Encounter for screening mammogram for malignant neoplasm of breast: Secondary | ICD-10-CM

## 2017-02-14 DIAGNOSIS — Z130 Encounter for screening for diseases of the blood and blood-forming organs and certain disorders involving the immune mechanism: Secondary | ICD-10-CM | POA: Diagnosis not present

## 2017-02-14 DIAGNOSIS — R740 Nonspecific elevation of levels of transaminase and lactic acid dehydrogenase [LDH]: Secondary | ICD-10-CM | POA: Diagnosis not present

## 2017-02-14 DIAGNOSIS — Z136 Encounter for screening for cardiovascular disorders: Secondary | ICD-10-CM | POA: Diagnosis not present

## 2017-02-14 DIAGNOSIS — J069 Acute upper respiratory infection, unspecified: Secondary | ICD-10-CM

## 2017-02-14 DIAGNOSIS — Z79899 Other long term (current) drug therapy: Secondary | ICD-10-CM

## 2017-02-14 DIAGNOSIS — E782 Mixed hyperlipidemia: Secondary | ICD-10-CM

## 2017-02-14 DIAGNOSIS — Z13220 Encounter for screening for lipoid disorders: Secondary | ICD-10-CM | POA: Diagnosis not present

## 2017-02-14 MED ORDER — ASPIRIN EC 81 MG PO TBEC: 81.0000 mg | DELAYED_RELEASE_TABLET | Freq: Every day | ORAL | 3 refills | Status: DC

## 2017-02-14 MED ORDER — HYDROCODONE-HOMATROPINE 5-1.5 MG/5ML PO SYRP: 5.0000 mL | ORAL_SOLUTION | Freq: Three times a day (TID) | ORAL | 0 refills | Status: DC | PRN

## 2017-02-14 MED ORDER — BENZONATATE 200 MG PO CAPS: 200.0000 mg | ORAL_CAPSULE | Freq: Three times a day (TID) | ORAL | 0 refills | Status: DC | PRN

## 2017-02-14 NOTE — Progress Notes (Signed)
HPI:                                                                Allison Reyes is a 56 y.o. female who presents to [name]: [facility] today for medication management  Originally scheduled for annual physical exam, but she is not due until December. Will use today's visit to address chronic conditions and acute concerns  Current concerns include: cold symptoms  Reports she developed hoarseness, cough, and myalgias 4 days ago. Reports wheezing and SOB last night. Cough is productive of clear mucus. Has been taking Aleve Cold and Sinus and Emergen-C. Current some day smoker <1 pack per week. Denies history of lung disease.   Hypothyroidism/Hx of Hyperparathyroidism: taking Levothyroxine 100 mcg QAC. Compliant with medications. Denies symptoms of hypo/hyperthyroidism  HLD: taking Atorvastatin 20mg  nightly. Compliant with medications. Denies myalgias.  Depression/Anxiety: taking Celexa and Wellbutrin without difficulty. Reports she is doing "much better" since starting a new job. Denies depressed mood or anhedonia. Denies symptoms of mania/hypomania. Denies suicidal thinking. Denies auditory/visual hallucinations.   Past Medical History:  Diagnosis Date  . Depression   . GERD (gastroesophageal reflux disease)   . Hyperlipidemia   . Hyperparathyroidism   . Osteopenia    Past Surgical History:  Procedure Laterality Date  . ABDOMINAL HYSTERECTOMY     TAH - endometriosis  . LAPAROSCOPIC ENDOMETRIOSIS FULGURATION    . NASAL SINUS SURGERY  01/03/11  . OOPHORECTOMY    . THYROIDECTOMY, PARTIAL     Social History  Substance Use Topics  . Smoking status: Light Tobacco Smoker    Types: Cigarettes  . Smokeless tobacco: Never Used     Comment: 7 cigarettes daily.  . Alcohol use Yes     Comment: social-wine   family history includes Alcohol abuse in her mother; Coronary artery disease in her mother; Depression in her mother; Hypertension in her  father and mother; Lung cancer in her father; Stroke in her paternal grandfather.  ROS: negative except as noted in the HPI  Medications: Current Outpatient Prescriptions  Medication Sig Dispense Refill  . atorvastatin (LIPITOR) 40 MG tablet Take 1 tablet (40 mg total) by mouth at bedtime. 90 tablet 2  . calcitRIOL (ROCALTROL) 0.5 MCG capsule Take one capsule in the morning and take two capsules in the evenings. 270 capsule 3  . cetirizine (ZYRTEC) 10 MG tablet Take 1 tablet (10 mg total) by mouth daily. 90 tablet 3  . citalopram (CELEXA) 40 MG tablet Take 1 tablet (40 mg total) by mouth daily. 90 tablet 3  . esomeprazole (NEXIUM) 40 MG capsule TAKE 1 CAPSULE(40 MG) BY MOUTH DAILY. 90 capsule 3  . fluticasone (FLONASE) 50 MCG/ACT nasal spray Place 2 sprays into both nostrils daily. 16 g 2  . ROPINIRole HCl (REQUIP PO) Take by mouth daily as needed (RLS).    Marland Kitchen zolpidem (AMBIEN) 10 MG tablet Take 1 tablet (10 mg total) by mouth at bedtime as needed. 45 tablet 0  . aspirin EC 81 MG tablet Take 1 tablet (81 mg total) by mouth daily. 90 tablet 3  . benzonatate (TESSALON) 200 MG capsule Take 1 capsule (200 mg total) by mouth 3 (three) times daily as needed for cough. 45 capsule 0  .  buPROPion (WELLBUTRIN XL) 150 MG 24 hr tablet TAKE 1 TABLET(150 MG) BY MOUTH EVERY MORNING 90 tablet 0  . HYDROcodone-homatropine (HYCODAN) 5-1.5 MG/5ML syrup Take 5 mLs by mouth every 8 (eight) hours as needed for cough. 60 mL 0  . levothyroxine (SYNTHROID, LEVOTHROID) 112 MCG tablet Take 1 tablet (112 mcg total) by mouth daily before breakfast. 30 tablet 3   No current facility-administered medications for this visit.    No Known Allergies     Objective:  BP 114/79   Pulse 71   Temp 97.6 F (36.4 C) (Oral)   Wt 241 lb (109.3 kg)   SpO2 96%   BMI 42.69 kg/m  Gen:  alert, not ill-appearing, no distress, appropriate for age, morbidly obese female HEENT: head normocephalic without obvious abnormality,  conjunctiva and cornea clear, wearing glasses, nasal mucosa edematous, oropharynx clear, uvula midline, no exudates, neck supple, trachea midline Pulm: Normal work of breathing, normal phonation, clear to auscultation bilaterally, no wheezes, rales or rhonchi CV: Normal rate, regular rhythm, s1 and s2 distinct, no murmurs, clicks or rubs  Neuro: alert and oriented x 3, no tremor MSK: extremities atraumatic, normal gait and station Skin: intact, no rashes on exposed skin, no jaundice, no cyanosis Psych: well-groomed, cooperative, good eye contact, euthymic mood, affect mood-congruent, speech is articulate, and thought processes clear and goal-directed    [facility] 02/14/2017 11/06/2016 04/15/2016  Decreased Interest 0 2 2  Down, Depressed, Hopeless 0 2 2  PHQ - 2 Score 0 4 4  Altered sleeping 1 3 3   Tired, decreased energy 2 3 3   Change in appetite 2 3 3   Feeling bad or failure about yourself  1 3 3   Trouble concentrating 0 2 2  Moving slowly or fidgety/restless 2 1 0  Suicidal thoughts 0 1 0  PHQ-9 Score 8 20 18      No results found for this or any previous visit (from the past 72 hour(s)). No results found.    Assessment and Plan: 56 y.o. female with   1. Acute upper respiratory infection - symptomatic management, push PO fluids - HYDROcodone-homatropine (HYCODAN) 5-1.5 MG/5ML syrup; Take 5 mLs by mouth every 8 (eight) hours as needed for cough.  Dispense: 60 mL; Refill: 0  2. At risk for obstructive sleep apnea - Home sleep test; Future  3. Snoring - Home sleep test; Future  4. Morbid obesity due to excess calories (HCC) - Home sleep test; Future - weight loss through calorie reduction and increasing exercise  5. 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 Post-operative hypothyroidism - TSH  7. History of hyperparathyroidism - PTH, Intact and Calcium  8. Recurrent major depressive disorder, in partial  remission (HCC) - PHQ2 score 0, stable on Celexa and wellbutrin - follow-up in 6 months   10. Mixed hyperlipidemia 10 yr ASCVD risk 5.6% - fasting lipid panel today - cont statin - weight loss through calorie reduction and increasing exercise  11. Encounter for vitamin deficiency screening - VITAMIN D 25 Hydroxy (Vit-D Deficiency, Fractures)  12. Screening for blood disease - CBC with Differential/Platelet  13. Encounter for monitoring statin therapy - Lipid Panel w/reflex Direct LDL  14. Breast cancer screening by mammogram - MM SCREENING BREAST TOMO BILATERAL; Future  15. Encounter for long-term (current) use of medications    Patient education and anticipatory guidance given Patient agrees with treatment plan Follow-up in 3 months for medication management or sooner as needed  if symptoms worsen or fail to improve  Darlyne Russian PA-C

## 2017-02-14 NOTE — Patient Instructions (Addendum)
Recommended vaccines: - Shringrix for shingles/herpes zoster - Pneumovax for pneumonia due to tobacco use   Sleep Apnea Sleep apnea is a condition in which breathing pauses or becomes shallow during sleep. Episodes of sleep apnea usually last 10 seconds or longer, and they may occur as many as 20 times an hour. Sleep apnea disrupts your sleep and keeps your body from getting the rest that it needs. This condition can increase your risk of certain health problems, including:  Heart attack.  Stroke.  Obesity.  Diabetes.  Heart failure.  Irregular heartbeat.  There are three kinds of sleep apnea:  Obstructive sleep apnea. This kind is caused by a blocked or collapsed airway.  Central sleep apnea. This kind happens when the part of the brain that controls breathing does not send the correct signals to the muscles that control breathing.  Mixed sleep apnea. This is a combination of obstructive and central sleep apnea.  What are the causes? The most common cause of this condition is a collapsed or blocked airway. An airway can collapse or become blocked if:  Your throat muscles are abnormally relaxed.  Your tongue and tonsils are larger than normal.  You are overweight.  Your airway is smaller than normal.  What increases the risk? This condition is more likely to develop in people who:  Are overweight.  Smoke.  Have a smaller than normal airway.  Are elderly.  Are female.  Drink alcohol.  Take sedatives or tranquilizers.  Have a family history of sleep apnea.  What are the signs or symptoms? Symptoms of this condition include:  Trouble staying asleep.  Daytime sleepiness and tiredness.  Irritability.  Loud snoring.  Morning headaches.  Trouble concentrating.  Forgetfulness.  Decreased interest in sex.  Unexplained sleepiness.  Mood swings.  Personality changes.  Feelings of depression.  Waking up often during the night to urinate.  Dry  mouth.  Sore throat.  How is this diagnosed? This condition may be diagnosed with:  A medical history.  A physical exam.  A series of tests that are done while you are sleeping (sleep study). These tests are usually done in a sleep lab, but they may also be done at home.  How is this treated? Treatment for this condition aims to restore normal breathing and to ease symptoms during sleep. It may involve managing health issues that can affect breathing, such as high blood pressure or obesity. Treatment may include:  Sleeping on your side.  Using a decongestant if you have nasal congestion.  Avoiding the use of depressants, including alcohol, sedatives, and narcotics.  Losing weight if you are overweight.  Making changes to your diet.  Quitting smoking.  Using a device to open your airway while you sleep, such as: ? An oral appliance. This is a custom-made mouthpiece that shifts your lower jaw forward. ? A continuous positive airway pressure (CPAP) device. This device delivers oxygen to your airway through a mask. ? A nasal expiratory positive airway pressure (EPAP) device. This device has valves that you put into each nostril. ? A bi-level positive airway pressure (BPAP) device. This device delivers oxygen to your airway through a mask.  Surgery if other treatments do not work. During surgery, excess tissue is removed to create a wider airway.  It is important to get treatment for sleep apnea. Without treatment, this condition can lead to:  High blood pressure.  Coronary artery disease.  (Men) An inability to achieve or maintain an erection (impotence).  Reduced thinking abilities.  Follow these instructions at home:  Make any lifestyle changes that your health care provider recommends.  Eat a healthy, well-balanced diet.  Take over-the-counter and prescription medicines only as told by your health care provider.  Avoid using depressants, including alcohol,  sedatives, and narcotics.  Take steps to lose weight if you are overweight.  If you were given a device to open your airway while you sleep, use it only as told by your health care provider.  Do not use any tobacco products, such as cigarettes, chewing tobacco, and e-cigarettes. If you need help quitting, ask your health care provider.  Keep all follow-up visits as told by your health care provider. This is important. Contact a health care provider if:  The device that you received to open your airway during sleep is uncomfortable or does not seem to be working.  Your symptoms do not improve.  Your symptoms get worse. Get help right away if:  You develop chest pain.  You develop shortness of breath.  You develop discomfort in your back, arms, or stomach.  You have trouble speaking.  You have weakness on one side of your body.  You have drooping in your face. These symptoms may represent a serious problem that is an emergency. Do not wait to see if the symptoms will go away. Get medical help right away. Call your local emergency services (911 in the [place] Do not drive yourself to the hospital. This information is not intended to replace advice given to you by your health care provider. Make sure you discuss any questions you have with your health care provider. Document Released: 04/05/2002 Document Revised: 12/10/2015 Document Reviewed: 01/23/2015 Elsevier Interactive Patient Education  [employer]2018 [employer]

## 2017-02-15 ENCOUNTER — Other Ambulatory Visit: Payer: Self-pay | Admitting: Physician Assistant

## 2017-02-15 DIAGNOSIS — F332 Major depressive disorder, recurrent severe without psychotic features: Secondary | ICD-10-CM

## 2017-02-17 LAB — PTH, INTACT AND CALCIUM
Calcium: 9.4 mg/dL (ref 8.6–10.4)
PTH: 16 pg/mL (ref 14–64)

## 2017-02-17 LAB — COMPLETE METABOLIC PANEL WITH GFR
AG Ratio: 1.5 (calc) (ref 1.0–2.5)
ALT: 37 U/L — ABNORMAL HIGH (ref 6–29)
AST: 22 U/L (ref 10–35)
Albumin: 4 g/dL (ref 3.6–5.1)
Alkaline phosphatase (APISO): 99 U/L (ref 33–130)
BUN: 15 mg/dL (ref 7–25)
CO2: 30 mmol/L (ref 20–32)
Calcium: 9.4 mg/dL (ref 8.6–10.4)
Chloride: 102 mmol/L (ref 98–110)
Creat: 0.88 mg/dL (ref 0.50–1.05)
GFR, Est African American: 85 mL/min/{1.73_m2} (ref >=60)
GFR, Est Non African American: 73 mL/min/{1.73_m2} (ref >=60)
Globulin: 2.6 g/dL (calc) (ref 1.9–3.7)
Glucose, Bld: 101 mg/dL — ABNORMAL HIGH (ref 65–99)
Potassium: 4.2 mmol/L (ref 3.5–5.3)
Sodium: 140 mmol/L (ref 135–146)
Total Bilirubin: 0.6 mg/dL (ref 0.2–1.2)
Total Protein: 6.6 g/dL (ref 6.1–8.1)

## 2017-02-17 LAB — LIPID PANEL W/REFLEX DIRECT LDL
Cholesterol: 222 mg/dL — ABNORMAL HIGH (ref <200)
HDL: 44 mg/dL — ABNORMAL LOW (ref >50)
LDL Cholesterol (Calc): 142 mg/dL (calc) — ABNORMAL HIGH
Non-HDL Cholesterol (Calc): 178 mg/dL (calc) — ABNORMAL HIGH (ref <130)
Total CHOL/HDL Ratio: 5 (calc) — ABNORMAL HIGH (ref <5.0)
Triglycerides: 226 mg/dL — ABNORMAL HIGH (ref <150)

## 2017-02-17 LAB — CBC WITH DIFFERENTIAL/PLATELET
Basophils Absolute: 91 cells/uL (ref 0–200)
Basophils Relative: 1.6 %
Eosinophils Absolute: 154 cells/uL (ref 15–500)
Eosinophils Relative: 2.7 %
HCT: 39.1 % (ref 35.0–45.0)
Hemoglobin: 13.4 g/dL (ref 11.7–15.5)
Lymphs Abs: 2041 cells/uL (ref 850–3900)
MCH: 30.6 pg (ref 27.0–33.0)
MCHC: 34.3 g/dL (ref 32.0–36.0)
MCV: 89.3 fL (ref 80.0–100.0)
MPV: 9.9 fL (ref 7.5–12.5)
Monocytes Relative: 7.1 %
Neutro Abs: 3010 cells/uL (ref 1500–7800)
Neutrophils Relative %: 52.8 %
Platelets: 314 10*3/uL (ref 140–400)
RBC: 4.38 10*6/uL (ref 3.80–5.10)
RDW: 12 % (ref 11.0–15.0)
Total Lymphocyte: 35.8 %
WBC mixed population: 405 cells/uL (ref 200–950)
WBC: 5.7 10*3/uL (ref 3.8–10.8)

## 2017-02-17 LAB — VITAMIN D 25 HYDROXY (VIT D DEFICIENCY, FRACTURES): Vit D, 25-Hydroxy: 41 ng/mL (ref 30–100)

## 2017-02-17 LAB — TSH: TSH: 3.89 mIU/L (ref 0.40–4.50)

## 2017-02-17 MED ORDER — ATORVASTATIN CALCIUM 40 MG PO TABS: 40.0000 mg | ORAL_TABLET | Freq: Every day | ORAL | 2 refills | Status: DC

## 2017-02-17 MED ORDER — LEVOTHYROXINE SODIUM 112 MCG PO TABS: 112.0000 ug | ORAL_TABLET | Freq: Every day | ORAL | 3 refills | Status: DC

## 2017-02-18 ENCOUNTER — Other Ambulatory Visit: Payer: Self-pay | Admitting: Physician Assistant

## 2017-02-18 ENCOUNTER — Other Ambulatory Visit: Payer: Self-pay | Admitting: *Deleted

## 2017-02-18 DIAGNOSIS — E890 Postprocedural hypothyroidism: Secondary | ICD-10-CM

## 2017-02-18 DIAGNOSIS — M8580 Other specified disorders of bone density and structure, unspecified site: Secondary | ICD-10-CM

## 2017-02-18 DIAGNOSIS — E559 Vitamin D deficiency, unspecified: Secondary | ICD-10-CM

## 2017-02-18 MED ORDER — LEVOTHYROXINE SODIUM 112 MCG PO TABS: 112.0000 ug | ORAL_TABLET | Freq: Every day | ORAL | 3 refills | Status: DC

## 2017-02-19 ENCOUNTER — Other Ambulatory Visit: Payer: Self-pay

## 2017-02-19 DIAGNOSIS — R0683 Snoring: Secondary | ICD-10-CM | POA: Insufficient documentation

## 2017-02-19 DIAGNOSIS — Z9189 Other specified personal risk factors, not elsewhere classified: Secondary | ICD-10-CM | POA: Insufficient documentation

## 2017-02-19 DIAGNOSIS — Z79899 Other long term (current) drug therapy: Secondary | ICD-10-CM | POA: Insufficient documentation

## 2017-02-19 DIAGNOSIS — J069 Acute upper respiratory infection, unspecified: Secondary | ICD-10-CM

## 2017-02-19 MED ORDER — BENZONATATE 200 MG PO CAPS: 200.0000 mg | ORAL_CAPSULE | Freq: Three times a day (TID) | ORAL | 0 refills | Status: DC | PRN

## 2017-03-04 ENCOUNTER — Other Ambulatory Visit: Payer: Self-pay

## 2017-03-04 ENCOUNTER — Inpatient Hospital Stay (HOSPITAL_BASED_OUTPATIENT_CLINIC_OR_DEPARTMENT_OTHER)
Admission: EM | Admit: 2017-03-04 | Discharge: 2017-03-08 | DRG: 519 | Disposition: A | Discharge status: Home Health | Payer: BLUE CROSS/BLUE SHIELD | Attending: Neurosurgery | Admitting: Neurosurgery

## 2017-03-04 ENCOUNTER — Ambulatory Visit: Payer: BLUE CROSS/BLUE SHIELD | Admitting: Family Medicine

## 2017-03-04 ENCOUNTER — Emergency Department (HOSPITAL_BASED_OUTPATIENT_CLINIC_OR_DEPARTMENT_OTHER): Payer: BLUE CROSS/BLUE SHIELD

## 2017-03-04 ENCOUNTER — Encounter (HOSPITAL_BASED_OUTPATIENT_CLINIC_OR_DEPARTMENT_OTHER): Payer: Self-pay

## 2017-03-04 DIAGNOSIS — Z6841 Body Mass Index (BMI) 40.0 and over, adult: Secondary | ICD-10-CM

## 2017-03-04 DIAGNOSIS — F329 Major depressive disorder, single episode, unspecified: Secondary | ICD-10-CM | POA: Diagnosis present

## 2017-03-04 DIAGNOSIS — G2581 Restless legs syndrome: Secondary | ICD-10-CM | POA: Diagnosis not present

## 2017-03-04 DIAGNOSIS — E890 Postprocedural hypothyroidism: Secondary | ICD-10-CM | POA: Diagnosis not present

## 2017-03-04 DIAGNOSIS — Z8639 Personal history of other endocrine, nutritional and metabolic disease: Secondary | ICD-10-CM

## 2017-03-04 DIAGNOSIS — M5116 Intervertebral disc disorders with radiculopathy, lumbar region: Secondary | ICD-10-CM | POA: Diagnosis not present

## 2017-03-04 DIAGNOSIS — E559 Vitamin D deficiency, unspecified: Secondary | ICD-10-CM | POA: Diagnosis not present

## 2017-03-04 DIAGNOSIS — M5441 Lumbago with sciatica, right side: Secondary | ICD-10-CM | POA: Diagnosis not present

## 2017-03-04 DIAGNOSIS — M5442 Lumbago with sciatica, left side: Secondary | ICD-10-CM | POA: Diagnosis not present

## 2017-03-04 DIAGNOSIS — Z79890 Hormone replacement therapy (postmenopausal): Secondary | ICD-10-CM

## 2017-03-04 DIAGNOSIS — M5489 Other dorsalgia: Secondary | ICD-10-CM | POA: Diagnosis not present

## 2017-03-04 DIAGNOSIS — R52 Pain, unspecified: Secondary | ICD-10-CM | POA: Diagnosis not present

## 2017-03-04 DIAGNOSIS — M5459 Other low back pain: Secondary | ICD-10-CM

## 2017-03-04 DIAGNOSIS — M549 Dorsalgia, unspecified: Secondary | ICD-10-CM | POA: Diagnosis not present

## 2017-03-04 DIAGNOSIS — E039 Hypothyroidism, unspecified: Secondary | ICD-10-CM | POA: Diagnosis present

## 2017-03-04 DIAGNOSIS — M5431 Sciatica, right side: Secondary | ICD-10-CM | POA: Diagnosis not present

## 2017-03-04 DIAGNOSIS — K8020 Calculus of gallbladder without cholecystitis without obstruction: Secondary | ICD-10-CM | POA: Diagnosis not present

## 2017-03-04 DIAGNOSIS — M8580 Other specified disorders of bone density and structure, unspecified site: Secondary | ICD-10-CM | POA: Diagnosis present

## 2017-03-04 DIAGNOSIS — M545 Low back pain: Secondary | ICD-10-CM | POA: Diagnosis not present

## 2017-03-04 DIAGNOSIS — F17210 Nicotine dependence, cigarettes, uncomplicated: Secondary | ICD-10-CM | POA: Diagnosis present

## 2017-03-04 DIAGNOSIS — M5126 Other intervertebral disc displacement, lumbar region: Secondary | ICD-10-CM | POA: Diagnosis not present

## 2017-03-04 DIAGNOSIS — K219 Gastro-esophageal reflux disease without esophagitis: Secondary | ICD-10-CM | POA: Diagnosis not present

## 2017-03-04 DIAGNOSIS — M4326 Fusion of spine, lumbar region: Secondary | ICD-10-CM | POA: Diagnosis not present

## 2017-03-04 DIAGNOSIS — Z7982 Long term (current) use of aspirin: Secondary | ICD-10-CM

## 2017-03-04 DIAGNOSIS — G8929 Other chronic pain: Secondary | ICD-10-CM | POA: Diagnosis not present

## 2017-03-04 DIAGNOSIS — E785 Hyperlipidemia, unspecified: Secondary | ICD-10-CM | POA: Diagnosis not present

## 2017-03-04 HISTORY — DX: Stress incontinence (female) (male): N39.3

## 2017-03-04 LAB — COMPREHENSIVE METABOLIC PANEL
ALT: 40 U/L (ref 14–54)
AST: 31 U/L (ref 15–41)
Albumin: 3.6 g/dL (ref 3.5–5.0)
Alkaline Phosphatase: 119 U/L (ref 38–126)
Anion gap: 6 (ref 5–15)
BUN: 19 mg/dL (ref 6–20)
CO2: 25 mmol/L (ref 22–32)
Calcium: 8.2 mg/dL — ABNORMAL LOW (ref 8.9–10.3)
Chloride: 110 mmol/L (ref 101–111)
Creatinine, Ser: 0.85 mg/dL (ref 0.44–1.00)
GFR calc Af Amer: >60 mL/min (ref >60)
GFR calc non Af Amer: >60 mL/min (ref >60)
Glucose, Bld: 113 mg/dL — ABNORMAL HIGH (ref 65–99)
Potassium: 3.9 mmol/L (ref 3.5–5.1)
Sodium: 141 mmol/L (ref 135–145)
Total Bilirubin: 0.6 mg/dL (ref 0.3–1.2)
Total Protein: 6.4 g/dL — ABNORMAL LOW (ref 6.5–8.1)

## 2017-03-04 LAB — CBC WITH DIFFERENTIAL/PLATELET
Basophils Absolute: 0.1 10*3/uL (ref 0.0–0.1)
Basophils Relative: 1 %
Eosinophils Absolute: 0.1 10*3/uL (ref 0.0–0.7)
Eosinophils Relative: 2 %
HCT: 37.1 % (ref 36.0–46.0)
Hemoglobin: 12.3 g/dL (ref 12.0–15.0)
Lymphocytes Relative: 19 %
Lymphs Abs: 1.5 10*3/uL (ref 0.7–4.0)
MCH: 30.1 pg (ref 26.0–34.0)
MCHC: 33.2 g/dL (ref 30.0–36.0)
MCV: 90.9 fL (ref 78.0–100.0)
Monocytes Absolute: 0.6 10*3/uL (ref 0.1–1.0)
Monocytes Relative: 8 %
Neutro Abs: 5.4 10*3/uL (ref 1.7–7.7)
Neutrophils Relative %: 70 %
Platelets: 282 10*3/uL (ref 150–400)
RBC: 4.08 MIL/uL (ref 3.87–5.11)
RDW: 12.2 % (ref 11.5–15.5)
WBC: 7.7 10*3/uL (ref 4.0–10.5)

## 2017-03-04 LAB — LIPASE, BLOOD: Lipase: 31 U/L (ref 11–51)

## 2017-03-04 MED ORDER — KETOROLAC TROMETHAMINE 30 MG/ML IJ SOLN
30.0000 mg | Freq: Once | INTRAMUSCULAR | Status: AC
  Administered 2017-03-04: 30 mg via INTRAVENOUS
  Filled 2017-03-04: qty 1

## 2017-03-04 MED ORDER — DEXAMETHASONE SODIUM PHOSPHATE 10 MG/ML IJ SOLN
10.0000 mg | Freq: Once | INTRAMUSCULAR | Status: AC
  Administered 2017-03-04: 10 mg via INTRAVENOUS
  Filled 2017-03-04: qty 1

## 2017-03-04 MED ORDER — MORPHINE SULFATE (PF) 4 MG/ML IV SOLN
4.0000 mg | Freq: Once | INTRAVENOUS | Status: AC
  Administered 2017-03-04: 4 mg via INTRAVENOUS
  Filled 2017-03-04: qty 1

## 2017-03-04 MED ORDER — DIAZEPAM 5 MG/ML IJ SOLN
5.0000 mg | Freq: Once | INTRAMUSCULAR | Status: AC
  Administered 2017-03-04: 5 mg via INTRAVENOUS
  Filled 2017-03-04: qty 2

## 2017-03-04 NOTE — ED Notes (Signed)
Pt does state she has a hx of stress incontinence, but it has not worsened with this back pain.

## 2017-03-04 NOTE — ED Provider Notes (Signed)
Emergency Department Provider Note   I have reviewed the triage vital signs and the nursing notes.   HISTORY  Chief Complaint Back Pain   HPI Allison Reyes is a 56 y.o. female with PMH of GERD, HLD, osteopenia resents to the emergency department for evaluation of acute onset left lower back pain radiating down the left leg.  She had some soreness yesterday which has now progressed to severe pain.  She describes it as intermittent and cramping/sharp in quality.  Denies any numbness or weakness in the legs.  She has been unable to walk or get out of bed at home today because of severe pain.  Had lower back pain in the past but nothing this severe.  She denies any bowel or bladder incontinence.  No groin numbness.  She has had no instrumentation to the spine.  No fevers or chills.  She is also having some discomfort radiating around to the lower abdomen.  She denies any hematuria, dysuria, hesitancy, urgency.  She has tried Aleve at home with no results.   Past Medical History:  Diagnosis Date  . Depression   . GERD (gastroesophageal reflux disease)   . Hyperlipidemia   . Hyperparathyroidism   . Osteopenia   . Stress incontinence     Patient Active Problem List   Diagnosis Date Noted  . Hypothyroidism 03/05/2017  . Back pain 03/05/2017  . Intractable low back pain 03/04/2017  . At risk for obstructive sleep apnea 02/19/2017  . Snoring 02/19/2017  . Encounter for long-term (current) use of medications 02/19/2017  . History of hyperparathyroidism 11/06/2016  . Chronic allergic rhinitis 11/06/2016  . Vitamin D deficiency 11/06/2016  . 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 History of osteopenia 11/06/2016  . Elevated alanine aminotransferase (ALT) level 04/17/2016  . Severe episode of recurrent major depressive disorder, without psychotic features (HCC) 04/15/2016  . Post-operative hypothyroidism 04/15/2016  . Encounter for monitoring statin therapy 05/19/2015  .  Morbid obesity due to excess calories (HCC) 05/19/2015  . Hyperlipidemia 08/13/2011  . Stress incontinence 08/13/2011  . Weight gain 01/29/2011  . GERD 05/04/2010  . RESTLESS LEG SYNDROME 03/30/2010  . SKIN LESIONS, MULTIPLE 11/22/2008    Past Surgical History:  Procedure Laterality Date  . ABDOMINAL HYSTERECTOMY     TAH - endometriosis  . LAPAROSCOPIC ENDOMETRIOSIS FULGURATION    . NASAL SINUS SURGERY  01/03/11  . OOPHORECTOMY    . THYROIDECTOMY, PARTIAL        Allergies Patient has no known allergies.  Family History  Problem Relation Age of Onset  . Coronary artery disease Mother   . Hypertension Mother   . Alcohol abuse Mother   . Depression Mother   . Hypertension Father   . Lung cancer Father   . Stroke Paternal Grandfather   . Diabetes Neg Hx   . Colon cancer Neg Hx   . Breast cancer Neg Hx     Social History Social History   Tobacco Use  . Smoking status: Light Tobacco Smoker    Types: Cigarettes  . Smokeless tobacco: Never Used  . Tobacco comment: 7 cigarettes daily.  Substance Use Topics  . Alcohol use: Yes    Comment: social-wine  . Drug use: No    Review of Systems  Constitutional: No fever/chills Eyes: No visual changes. ENT: No sore throat. Cardiovascular: Denies chest pain. Respiratory: Denies shortness of breath. Gastrointestinal: No abdominal pain.  No nausea, no vomiting.  No diarrhea.  No constipation. Genitourinary:  Negative for dysuria. Musculoskeletal: Positive for back pain and left leg sciatica.  Skin: Negative for rash. Neurological: Negative for headaches, focal weakness or numbness.  10-point ROS otherwise negative.  ____________________________________________   PHYSICAL EXAM:  VITAL SIGNS: ED Triage Vitals  Enc Vitals Group     BP 03/04/17 1651 (!) 106/53     Pulse Rate 03/04/17 1651 78     Resp 03/04/17 1651 18     Temp 03/04/17 1651 98.2 F (36.8 C)     Temp Source 03/04/17 1651 Oral     SpO2 03/04/17 1651  97 %     Weight 03/04/17 1653 235 lb (106.6 kg)     Height 03/04/17 1653 5\' 3"  (1.6 m)     Pain Score 03/04/17 1653 9   Constitutional: Alert and oriented. Appears very uncomfortable and shouting with even small movements.  Eyes: Conjunctivae are normal.  Head: Atraumatic. Nose: No congestion/rhinnorhea. Mouth/Throat: Mucous membranes are moist.  Oropharynx non-erythematous. Neck: No stridor.  Cardiovascular: Normal rate, regular rhythm. Good peripheral circulation. Grossly normal heart sounds.   Respiratory: Normal respiratory effort.  No retractions. Lungs CTAB. Gastrointestinal: Soft and nontender. No distention.  Musculoskeletal: No lower extremity tenderness nor edema. No gross deformities of extremities. Neurologic:  Normal speech and language. No gross focal neurologic deficits are appreciated. 2+ patellar reflexes. Normal sensation and strength in bilateral LEs.  Skin:  Skin is warm, dry and intact. No rash noted.  ____________________________________________   LABS (all labs ordered are listed, but only abnormal results are displayed)  Labs Reviewed  COMPREHENSIVE METABOLIC PANEL - Abnormal; Notable for the following components:      Result Value   Glucose, Bld 113 (*)    Calcium 8.2 (*)    Total Protein 6.4 (*)    All other components within normal limits  BASIC METABOLIC PANEL - Abnormal; Notable for the following components:   Glucose, Bld 139 (*)    Calcium 8.5 (*)    All other components within normal limits  LIPASE, BLOOD  CBC WITH DIFFERENTIAL/PLATELET  URINALYSIS, ROUTINE W REFLEX MICROSCOPIC  CBC  URINALYSIS, ROUTINE W REFLEX MICROSCOPIC   ____________________________________________  RADIOLOGY  Mr Lumbar Spine Wo Contrast  Result Date: 03/05/2017 CLINICAL DATA:  Lumbar radiculopathy.  Low back pain left leg pain EXAM: MRI LUMBAR SPINE WITHOUT CONTRAST TECHNIQUE: Multiplanar, multisequence MR imaging of the lumbar spine was performed. No intravenous  contrast was administered. COMPARISON:  CT abdomen pelvis 03/04/2017 FINDINGS: Segmentation:  Normal Alignment:  Normal Vertebrae:  Negative for fracture or mass. Conus medullaris: Extends to the T12 level and appears normal. Paraspinal and other soft tissues: Negative for mass or adenopathy Disc levels: L1-2:  Negative L2-3:  Negative L3-4: Mild disc bulging and mild facet degeneration without spinal stenosis L4-5: Large extruded disc fragment centrally and to the left with compression of the left L5 nerve root in the subarticular zone. Thecal sac is compressed and displaced by the disc protrusion. Mild spinal stenosis L5-S1: Mild disc space narrowing and disc degeneration. Mild facet degeneration. Negative for stenosis. IMPRESSION: Large extruded disc fragment centrally and to the left at L4-5 with compression of the left L5 nerve root. Electronically Signed   By: Marlan Palauharles  Clark M.D.   On: 03/05/2017 07:53   Ct Renal Stone Study  Result Date: 03/04/2017 CLINICAL DATA:  Severe back pain radiating to the right lower extremity. EXAM: CT ABDOMEN AND PELVIS WITHOUT CONTRAST TECHNIQUE: Multidetector CT imaging of the abdomen and pelvis was performed following the  standard protocol without IV contrast. COMPARISON:  None. FINDINGS: Lower chest: No acute abnormality. Hepatobiliary: No focal liver abnormality is seen. Contracted gallbladder with at least 5 gallstones. No evidence of pericholecystic fluid or biliary ductal dilation. Pancreas: Unremarkable. No pancreatic ductal dilatation or surrounding inflammatory changes. Spleen: Normal in size without focal abnormality. Adrenals/Urinary Tract: Adrenal glands are unremarkable. Kidneys are normal, without renal calculi, focal lesion, or hydronephrosis. Bladder is unremarkable. Stomach/Bowel: Stomach is within normal limits. Appendix appears normal. No evidence of bowel wall thickening, distention, or inflammatory changes. Redundant tortuous colon. Vascular/Lymphatic: No  significant vascular findings are present. No enlarged abdominal or pelvic lymph nodes. Reproductive: Status post hysterectomy. No adnexal masses. Other: No abdominal wall hernia or abnormality. No abdominopelvic ascites. Musculoskeletal: No evidence of fracture or suspicious osseous lesions. Soft tissue in the anterior epidural space at L4-L5. IMPRESSION: Cholelithiasis. Soft tissue within the anterior epidural space at L4-L5 may represent a disc extrusion. Given patient's symptoms, MRI of the lumbosacral spine may be considered for better visualization. Electronically Signed   By: Ted Mcalpine M.D.   On: 03/04/2017 19:18    ____________________________________________   PROCEDURES  Procedure(s) performed:   Procedures  None ____________________________________________   INITIAL IMPRESSION / ASSESSMENT AND PLAN / ED COURSE  Pertinent labs & imaging results that were available during my care of the patient were reviewed by me and considered in my medical decision making (see chart for details).  Patient appears acutely uncomfortable.  She is frequently shifting in the bed.  She has 2+ patellar reflexes bilaterally.  Normal sensation in both lower extremities.  Normal strength.  Pain worse with movement.  Symptoms are most consistent with sciatica.  She does have some discomfort with palpation of the lower abdomen but unclear if this is exacerbating her lower back pain or if there is an intra-abdominal process going on.  Plan for symptom treatment along with lab work and repeat exam.  No imaging at this time.  Patient with no active cancer or injury to suspect fracture or other injury.  07:18 PM Patient feeling slightly better. Will add morphine and reassess after CT imaging resulted.   09:26 PM Patient unable to sit up in bed without screaming in pain.  Patient has required multiple doses of IV opiate medications and muscle relaxers with no improvement in symptoms.  I do not feel I  am able to control her symptoms adequately to return home for outpatient evaluation and outpatient MRI. Will discuss pain control admission with the hospitalist.   Discussed patient's case with Hospitalist, Dr. Antionette Char to request admission. Patient and family (if present) updated with plan. Care transferred to Hospitalist service.  I reviewed all nursing notes, vitals, pertinent old records, EKGs, labs, imaging (as available).  ____________________________________________  FINAL CLINICAL IMPRESSION(S) / ED DIAGNOSES  Final diagnoses:  Intractable back pain  Sciatica of right side     MEDICATIONS GIVEN DURING THIS VISIT:  Medications  atorvastatin (LIPITOR) tablet 40 mg (not administered)  buPROPion (WELLBUTRIN XL) 24 hr tablet 150 mg (150 mg Oral Given 03/05/17 1015)  loratadine (CLARITIN) tablet 10 mg (10 mg Oral Given 03/05/17 1013)  citalopram (CELEXA) tablet 40 mg (40 mg Oral Given 03/05/17 1014)  pantoprazole (PROTONIX) EC tablet 40 mg (40 mg Oral Given 03/05/17 1015)  fluticasone (FLONASE) 50 MCG/ACT nasal spray 2 spray (2 sprays Each Nare Not Given 03/05/17 1013)  levothyroxine (SYNTHROID, LEVOTHROID) tablet 112 mcg (112 mcg Oral Given 03/05/17 1013)  zolpidem (AMBIEN) tablet 5 mg (not  administered)  acetaminophen (TYLENOL) tablet 650 mg (not administered)    Or  acetaminophen (TYLENOL) suppository 650 mg (not administered)  ondansetron (ZOFRAN) tablet 4 mg (not administered)    Or  ondansetron (ZOFRAN) injection 4 mg (not administered)  morphine 2 MG/ML injection 2 mg (2 mg Intravenous Given 03/05/17 1015)  calcitRIOL (ROCALTROL) capsule 0.5 mcg (0.5 mcg Oral Given 03/05/17 1014)  calcitRIOL (ROCALTROL) capsule 1 mcg (not administered)  methocarbamol (ROBAXIN) tablet 500 mg (500 mg Oral Given 03/05/17 1013)  dexamethasone (DECADRON) tablet 4 mg (4 mg Oral Given 03/05/17 1014)  ketorolac (TORADOL) 30 MG/ML injection 30 mg (30 mg Intravenous Given 03/05/17 1022)  dexamethasone  (DECADRON) injection 10 mg (10 mg Intravenous Given 03/04/17 1800)  diazepam (VALIUM) injection 5 mg (5 mg Intravenous Given 03/04/17 1759)  ketorolac (TORADOL) 30 MG/ML injection 30 mg (30 mg Intravenous Given 03/04/17 1758)  morphine 4 MG/ML injection 4 mg (4 mg Intravenous Given 03/04/17 2012)  methocarbamol (ROBAXIN) tablet 1,000 mg (1,000 mg Oral Given 03/05/17 0014)  morphine 2 MG/ML injection 2 mg (2 mg Intravenous Given 03/05/17 0214)     NEW OUTPATIENT MEDICATIONS STARTED DURING THIS VISIT:  None   Note:  This document was prepared using Dragon voice recognition software and may include unintentional dictation errors.  Alona Bene, MD Emergency Medicine  Long, Arlyss Repress, MD 03/05/17 1149

## 2017-03-04 NOTE — ED Notes (Signed)
Pt also was placed on EtCO2

## 2017-03-04 NOTE — ED Notes (Signed)
EMT attempted to get pt out of bed to ambulate and obtain urine sample. Pt made it as far as sitting on the side of the bed and then started screaming in pain and stated "I can't do it, it hurts too bad." MD notified.

## 2017-03-04 NOTE — ED Notes (Signed)
Pt became sedated after the valium and dropped her SpO2 to 86%. Pt quickly returned to normal with some verbal stimulation. Pt moved from hall to room 5. Pt states she is still having back spasms and is intermittently reporting a pain of "10"/10. EDP notified of pt's response to medication.

## 2017-03-04 NOTE — ED Triage Notes (Signed)
Pt arrived via [facility]. Per EMS pt had back pain that started yesterday, suddenly worse today. EMS report that pt's pain radiates down R leg. Pt has not ambulated for EMS due to pain. No incontinence noted.

## 2017-03-05 ENCOUNTER — Other Ambulatory Visit: Payer: Self-pay

## 2017-03-05 ENCOUNTER — Observation Stay (HOSPITAL_COMMUNITY): Payer: BLUE CROSS/BLUE SHIELD

## 2017-03-05 ENCOUNTER — Encounter (HOSPITAL_COMMUNITY): Payer: Self-pay | Admitting: Internal Medicine

## 2017-03-05 DIAGNOSIS — M545 Low back pain: Secondary | ICD-10-CM | POA: Diagnosis not present

## 2017-03-05 DIAGNOSIS — E039 Hypothyroidism, unspecified: Secondary | ICD-10-CM | POA: Diagnosis present

## 2017-03-05 DIAGNOSIS — M5126 Other intervertebral disc displacement, lumbar region: Secondary | ICD-10-CM | POA: Diagnosis not present

## 2017-03-05 DIAGNOSIS — M549 Dorsalgia, unspecified: Secondary | ICD-10-CM | POA: Diagnosis present

## 2017-03-05 LAB — BASIC METABOLIC PANEL
Anion gap: 8 (ref 5–15)
BUN: 17 mg/dL (ref 6–20)
CO2: 24 mmol/L (ref 22–32)
Calcium: 8.5 mg/dL — ABNORMAL LOW (ref 8.9–10.3)
Chloride: 106 mmol/L (ref 101–111)
Creatinine, Ser: 0.87 mg/dL (ref 0.44–1.00)
GFR calc Af Amer: >60 mL/min (ref >60)
GFR calc non Af Amer: >60 mL/min (ref >60)
Glucose, Bld: 139 mg/dL — ABNORMAL HIGH (ref 65–99)
Potassium: 3.8 mmol/L (ref 3.5–5.1)
Sodium: 138 mmol/L (ref 135–145)

## 2017-03-05 LAB — URINALYSIS, ROUTINE W REFLEX MICROSCOPIC
Bilirubin Urine: NEGATIVE
Glucose, UA: NEGATIVE mg/dL
Hgb urine dipstick: NEGATIVE
Ketones, ur: NEGATIVE mg/dL
Leukocytes, UA: NEGATIVE
Nitrite: NEGATIVE
Protein, ur: NEGATIVE mg/dL
Specific Gravity, Urine: 1.018 (ref 1.005–1.030)
pH: 6 (ref 5.0–8.0)

## 2017-03-05 LAB — CBC
HCT: 38.5 % (ref 36.0–46.0)
Hemoglobin: 12.8 g/dL (ref 12.0–15.0)
MCH: 30.3 pg (ref 26.0–34.0)
MCHC: 33.2 g/dL (ref 30.0–36.0)
MCV: 91 fL (ref 78.0–100.0)
Platelets: 308 10*3/uL (ref 150–400)
RBC: 4.23 MIL/uL (ref 3.87–5.11)
RDW: 12.5 % (ref 11.5–15.5)
WBC: 7.8 10*3/uL (ref 4.0–10.5)

## 2017-03-05 MED ORDER — KETOROLAC TROMETHAMINE 30 MG/ML IJ SOLN
30.0000 mg | Freq: Three times a day (TID) | INTRAMUSCULAR | Status: DC
  Administered 2017-03-05 – 2017-03-08 (×10): 30 mg via INTRAVENOUS
  Filled 2017-03-05 (×9): qty 1

## 2017-03-05 MED ORDER — METHOCARBAMOL 500 MG PO TABS
500.0000 mg | ORAL_TABLET | Freq: Four times a day (QID) | ORAL | Status: DC
  Administered 2017-03-05 (×3): 500 mg via ORAL
  Filled 2017-03-05 (×3): qty 1

## 2017-03-05 MED ORDER — MORPHINE SULFATE (PF) 2 MG/ML IV SOLN
2.0000 mg | Freq: Once | INTRAVENOUS | Status: AC
  Administered 2017-03-05: 2 mg via INTRAVENOUS
  Filled 2017-03-05: qty 1

## 2017-03-05 MED ORDER — LEVOTHYROXINE SODIUM 112 MCG PO TABS
112.0000 ug | ORAL_TABLET | Freq: Every day | ORAL | Status: DC
  Administered 2017-03-05 – 2017-03-08 (×4): 112 ug via ORAL
  Filled 2017-03-05 (×4): qty 1

## 2017-03-05 MED ORDER — ZOLPIDEM TARTRATE 5 MG PO TABS: 5.0000 mg | ORAL_TABLET | Freq: Every evening | ORAL | Status: DC | PRN

## 2017-03-05 MED ORDER — CALCITRIOL 0.25 MCG PO CAPS: 0.5000 ug | ORAL_CAPSULE | Freq: Every day | ORAL | Status: DC

## 2017-03-05 MED ORDER — PANTOPRAZOLE SODIUM 40 MG PO TBEC
40.0000 mg | DELAYED_RELEASE_TABLET | Freq: Every day | ORAL | Status: DC
  Administered 2017-03-05 – 2017-03-08 (×4): 40 mg via ORAL
  Filled 2017-03-05 (×4): qty 1

## 2017-03-05 MED ORDER — HYDROCODONE-ACETAMINOPHEN 5-325 MG PO TABS
1.0000 | ORAL_TABLET | Freq: Four times a day (QID) | ORAL | Status: DC | PRN
  Administered 2017-03-05 – 2017-03-07 (×4): 1 via ORAL
  Filled 2017-03-05 (×4): qty 1

## 2017-03-05 MED ORDER — ONDANSETRON HCL 4 MG PO TABS: 4.0000 mg | ORAL_TABLET | Freq: Four times a day (QID) | ORAL | Status: DC | PRN

## 2017-03-05 MED ORDER — ACETAMINOPHEN 650 MG RE SUPP: 650.0000 mg | Freq: Four times a day (QID) | RECTAL | Status: DC | PRN

## 2017-03-05 MED ORDER — FLUTICASONE PROPIONATE 50 MCG/ACT NA SUSP
2.0000 | Freq: Every day | NASAL | Status: DC
  Administered 2017-03-06: 2 via NASAL
  Filled 2017-03-05: qty 16

## 2017-03-05 MED ORDER — CALCITRIOL 0.25 MCG PO CAPS
1.0000 ug | ORAL_CAPSULE | Freq: Every day | ORAL | Status: DC
  Administered 2017-03-05 – 2017-03-07 (×3): 1 ug via ORAL
  Filled 2017-03-05 (×3): qty 4

## 2017-03-05 MED ORDER — DOCUSATE SODIUM 100 MG PO CAPS
100.0000 mg | ORAL_CAPSULE | Freq: Two times a day (BID) | ORAL | Status: DC
  Administered 2017-03-05 – 2017-03-08 (×5): 100 mg via ORAL
  Filled 2017-03-05 (×5): qty 1

## 2017-03-05 MED ORDER — DEXAMETHASONE 4 MG PO TABS
4.0000 mg | ORAL_TABLET | Freq: Two times a day (BID) | ORAL | Status: DC
  Administered 2017-03-05 – 2017-03-07 (×5): 4 mg via ORAL
  Filled 2017-03-05 (×5): qty 1

## 2017-03-05 MED ORDER — METHOCARBAMOL 500 MG PO TABS
500.0000 mg | ORAL_TABLET | Freq: Four times a day (QID) | ORAL | Status: DC
  Administered 2017-03-06 – 2017-03-08 (×10): 500 mg via ORAL
  Filled 2017-03-05 (×10): qty 1

## 2017-03-05 MED ORDER — METHOCARBAMOL 500 MG PO TABS
1000.0000 mg | ORAL_TABLET | Freq: Once | ORAL | Status: AC
  Administered 2017-03-05: 1000 mg via ORAL
  Filled 2017-03-05: qty 2

## 2017-03-05 MED ORDER — BUPROPION HCL ER (XL) 150 MG PO TB24
150.0000 mg | ORAL_TABLET | Freq: Every day | ORAL | Status: DC
  Administered 2017-03-05 – 2017-03-08 (×4): 150 mg via ORAL
  Filled 2017-03-05 (×4): qty 1

## 2017-03-05 MED ORDER — LORATADINE 10 MG PO TABS
10.0000 mg | ORAL_TABLET | Freq: Every day | ORAL | Status: DC
  Administered 2017-03-05 – 2017-03-08 (×4): 10 mg via ORAL
  Filled 2017-03-05 (×4): qty 1

## 2017-03-05 MED ORDER — CALCITRIOL 0.25 MCG PO CAPS
0.5000 ug | ORAL_CAPSULE | Freq: Every day | ORAL | Status: DC
  Administered 2017-03-05 – 2017-03-08 (×4): 0.5 ug via ORAL
  Filled 2017-03-05 (×4): qty 2

## 2017-03-05 MED ORDER — METHOCARBAMOL 1000 MG/10ML IJ SOLN
500.0000 mg | Freq: Four times a day (QID) | INTRAVENOUS | Status: DC | PRN
  Filled 2017-03-05: qty 5

## 2017-03-05 MED ORDER — CITALOPRAM HYDROBROMIDE 40 MG PO TABS
40.0000 mg | ORAL_TABLET | Freq: Every day | ORAL | Status: DC
  Administered 2017-03-05 – 2017-03-08 (×4): 40 mg via ORAL
  Filled 2017-03-05 (×4): qty 1

## 2017-03-05 MED ORDER — ACETAMINOPHEN 325 MG PO TABS: 650.0000 mg | ORAL_TABLET | Freq: Four times a day (QID) | ORAL | Status: DC | PRN

## 2017-03-05 MED ORDER — ATORVASTATIN CALCIUM 40 MG PO TABS
40.0000 mg | ORAL_TABLET | Freq: Every day | ORAL | Status: DC
  Administered 2017-03-05 – 2017-03-07 (×3): 40 mg via ORAL
  Filled 2017-03-05 (×3): qty 1

## 2017-03-05 MED ORDER — MORPHINE SULFATE (PF) 2 MG/ML IV SOLN
2.0000 mg | INTRAVENOUS | Status: DC | PRN
  Administered 2017-03-05 – 2017-03-07 (×3): 2 mg via INTRAVENOUS
  Filled 2017-03-05 (×3): qty 1

## 2017-03-05 MED ORDER — ONDANSETRON HCL 4 MG/2ML IJ SOLN
4.0000 mg | Freq: Four times a day (QID) | INTRAMUSCULAR | Status: DC | PRN
  Administered 2017-03-07: 4 mg via INTRAVENOUS
  Filled 2017-03-05: qty 2

## 2017-03-05 NOTE — Progress Notes (Signed)
Pt [facility]W [facility] around 0140 am. Pt is A&Ox4, VSS, afebrile. White bracelet in place with correct pt identifiers. Skin assessment completed with Greg CutterKaelin, RN-skin is intact. PIV to RAC-SL. Pt received 2 mg IV morphine on admission to unit. UA was collected and sent down to the lab. Pt oriented to room/unit/call light. Pt is a moderate fall risk. Call light within reach, will ctm.

## 2017-03-05 NOTE — Plan of Care (Signed)
  Coping: Level of anxiety will decrease 03/05/2017 2359 - Progressing by Marisa SprinklesElliott, Veda Arrellano, RN 03/05/2017 2359 - Progressing by Marisa SprinklesElliott, Jessyca Sloan, RN

## 2017-03-05 NOTE — H&P (Signed)
History and Physical    Allison HousemanMaureen Risk AOZ:[id number] DOB: 12/24/1960 DOA: 03/04/2017  PCP: Carlis Stableummings, Charley Elizabeth, PA-C  Patient coming from: Home.  Chief Complaint: Low back pain.  HPI: Allison Reyes is a 56 y.o. female with history of hypothyroidism, hyperlipidemia, previous history of hyperparathyroidism status post surgery on calcitriol presents to the ER with complaints of worsening low back pain radiating to the left leg.  Patient symptoms started a day before with mild low back pain which acutely worsened since yesterday morning.  Denies any trauma or fall or lifting heavy weights.  Denies any incontinence of urine or bowel.  Pain increases on moving the left lower extremity.  ED Course: In the ER patient had CT scan renal study which shows possible disc extrusion at L4-L5 area.  Recommended MRI.  Patient was given pain relief medications and muscle relaxant and admitted for further management.  Review of Systems: As per HPI, rest all negative.   Past Medical History:  Diagnosis Date  . Depression   . GERD (gastroesophageal reflux disease)   . Hyperlipidemia   . Hyperparathyroidism   . Osteopenia   . Stress incontinence     Past Surgical History:  Procedure Laterality Date  . ABDOMINAL HYSTERECTOMY     TAH - endometriosis  . LAPAROSCOPIC ENDOMETRIOSIS FULGURATION    . NASAL SINUS SURGERY  01/03/11  . OOPHORECTOMY    . THYROIDECTOMY, PARTIAL       reports that she has been smoking cigarettes.  she has never used smokeless tobacco. She reports that she drinks alcohol. She reports that she does not use drugs.  No Known Allergies  Family History  Problem Relation Age of Onset  . Coronary artery disease Mother   . Hypertension Mother   . Alcohol abuse Mother   . Depression Mother   . Hypertension Father   . Lung cancer Father   . Stroke Paternal Grandfather   . Diabetes Neg Hx   . Colon cancer Neg Hx   . Breast cancer Neg Hx     Prior to Admission  medications   Medication Sig Start Date End Date Taking? Authorizing Provider  aspirin EC 81 MG tablet Take 1 tablet (81 mg total) by mouth daily. 02/14/17   Carlis Stableummings, Charley Elizabeth, PA-C  atorvastatin (LIPITOR) 40 MG tablet Take 1 tablet (40 mg total) by mouth at bedtime. 02/17/17   Carlis Stableummings, Charley Elizabeth, PA-C  benzonatate (TESSALON) 200 MG capsule Take 1 capsule (200 mg total) by mouth 3 (three) times daily as needed for cough. 02/19/17   Carlis Stableummings, Charley Elizabeth, PA-C  buPROPion (WELLBUTRIN XL) 150 MG 24 hr tablet TAKE 1 TABLET(150 MG) BY MOUTH EVERY MORNING 02/17/17   Carlis Stableummings, Charley Elizabeth, PA-C  calcitRIOL (ROCALTROL) 0.5 MCG capsule Take one capsule in the morning and take two capsules in the evenings. 11/06/16   Carlis Stableummings, Charley Elizabeth, PA-C  cetirizine (ZYRTEC) 10 MG tablet Take 1 tablet (10 mg total) by mouth daily. 11/06/16   Carlis Stableummings, Charley Elizabeth, PA-C  citalopram (CELEXA) 40 MG tablet Take 1 tablet (40 mg total) by mouth daily. 11/06/16   Carlis Stableummings, Charley Elizabeth, PA-C  esomeprazole (NEXIUM) 40 MG capsule TAKE 1 CAPSULE(40 MG) BY MOUTH DAILY. 11/06/16   Carlis Stableummings, Charley Elizabeth, PA-C  fluticasone [facility](FLONASE) 50 MCG/ACT nasal spray Place 2 sprays into both nostrils daily. 01/16/15   Saguier, Ramon DredgeEdward, PA-C  HYDROcodone-homatropine (HYCODAN) 5-1.5 MG/5ML syrup Take 5 mLs by mouth every 8 (eight) hours as needed for cough. 02/14/17   Eddie Candleummings,  Bernerd Pho, PA-C  levothyroxine (SYNTHROID, LEVOTHROID) 112 MCG tablet Take 1 tablet (112 mcg total) by mouth daily before breakfast. 02/18/17   Carlis Stable, PA-C  ROPINIRole HCl (REQUIP PO) Take by mouth daily as needed (RLS).    [provider]  zolpidem (AMBIEN) 10 MG tablet Take 1 tablet (10 mg total) by mouth at bedtime as needed. 11/06/16   Carlis Stable, PA-C    Physical Exam: Vitals:   03/04/17 2007 03/04/17 2030 03/04/17 2336 03/05/17 0214  BP: 103/63 117/72 114/74  128/60  Pulse: 64 63 77 68  Resp: (!) 24 14 (!) 24 16  Temp:    (!) 97.5 F (36.4 C)  TempSrc:    Oral  SpO2: 99% 98% 100% 93%  Weight:    110.2 kg (243 lb)  Height:    5\' 3"  (1.6 m)      Constitutional: Moderately built and nourished. Vitals:   03/04/17 2007 03/04/17 2030 03/04/17 2336 03/05/17 0214  BP: 103/63 117/72 114/74 128/60  Pulse: 64 63 77 68  Resp: (!) 24 14 (!) 24 16  Temp:    (!) 97.5 F (36.4 C)  TempSrc:    Oral  SpO2: 99% 98% 100% 93%  Weight:    110.2 kg (243 lb)  Height:    5\' 3"  (1.6 m)   Eyes: Anicteric no pallor. ENMT: No discharge from the ears eyes nose or mouth. Neck: No mass felt.  No JVD appreciated. Respiratory: No rhonchi or crepitations. Cardiovascular: S1-S2 heard no murmurs appreciated. Abdomen: Soft nontender bowel sounds present. Musculoskeletal: Pain on moving left lower extremity. Skin: No rash. Neurologic: Alert awake oriented to time place and person.  Moves all extremities. Psychiatric: Appears normal.   Labs on Admission: I have personally reviewed following labs and imaging studies  CBC: Recent Labs  Lab 03/04/17 1825  WBC 7.7  NEUTROABS 5.4  HGB 12.3  HCT 37.1  MCV 90.9  PLT 282   Basic Metabolic Panel: Recent Labs  Lab 03/04/17 1825  NA 141  K 3.9  CL 110  CO2 25  GLUCOSE 113*  BUN 19  CREATININE 0.85  CALCIUM 8.2*   GFR: Estimated Creatinine Clearance: 88.1 mL/min (by C-G formula based on SCr of 0.85 mg/dL). Liver Function Tests: Recent Labs  Lab 03/04/17 1825  AST 31  ALT 40  ALKPHOS 119  BILITOT 0.6  PROT 6.4*  ALBUMIN 3.6   Recent Labs  Lab 03/04/17 1825  LIPASE 31   No results for input(s): AMMONIA in the last 168 hours. Coagulation Profile: No results for input(s): INR, PROTIME in the last 168 hours. Cardiac Enzymes: No results for input(s): CKTOTAL, CKMB, CKMBINDEX, TROPONINI in the last 168 hours. BNP (last 3 results) No results for input(s): PROBNP in the last 8760  hours. HbA1C: No results for input(s): HGBA1C in the last 72 hours. CBG: No results for input(s): GLUCAP in the last 168 hours. Lipid Profile: No results for input(s): CHOL, HDL, LDLCALC, TRIG, CHOLHDL, LDLDIRECT in the last 72 hours. Thyroid Function Tests: No results for input(s): TSH, T4TOTAL, FREET4, T3FREE, THYROIDAB in the last 72 hours. Anemia Panel: No results for input(s): VITAMINB12, FOLATE, FERRITIN, TIBC, IRON, RETICCTPCT in the last 72 hours. Urine analysis:    Component Value Date/Time   COLORURINE YELLOW 03/05/2017 0254   APPEARANCEUR CLEAR 03/05/2017 0254   LABSPEC 1.018 03/05/2017 0254   PHURINE 6.0 03/05/2017 0254   GLUCOSEU NEGATIVE 03/05/2017 0254   HGBUR NEGATIVE 03/05/2017 0254  HGBUR negative 03/30/2010 1541   BILIRUBINUR NEGATIVE 03/05/2017 0254   BILIRUBINUR neg 08/06/2016 1634   KETONESUR NEGATIVE 03/05/2017 0254   PROTEINUR NEGATIVE 03/05/2017 0254   UROBILINOGEN 0.2 08/06/2016 1634   UROBILINOGEN 0.2 03/30/2010 1541   NITRITE NEGATIVE 03/05/2017 0254   LEUKOCYTESUR NEGATIVE 03/05/2017 0254   Sepsis Labs: @LABRCNTIP (procalcitonin:4,lacticidven:4) )No results found for this or any previous visit (from the past 240 hour(s)).   Radiological Exams on Admission: Ct Renal Stone Study  Result Date: 03/04/2017 CLINICAL DATA:  Severe back pain radiating to the right lower extremity. EXAM: CT ABDOMEN AND PELVIS WITHOUT CONTRAST TECHNIQUE: Multidetector CT imaging of the abdomen and pelvis was performed following the standard protocol without IV contrast. COMPARISON:  None. FINDINGS: Lower chest: No acute abnormality. Hepatobiliary: No focal liver abnormality is seen. Contracted gallbladder with at least 5 gallstones. No evidence of pericholecystic fluid or biliary ductal dilation. Pancreas: Unremarkable. No pancreatic ductal dilatation or surrounding inflammatory changes. Spleen: Normal in size without focal abnormality. Adrenals/Urinary Tract: Adrenal glands  are unremarkable. Kidneys are normal, without renal calculi, focal lesion, or hydronephrosis. Bladder is unremarkable. Stomach/Bowel: Stomach is within normal limits. Appendix appears normal. No evidence of bowel wall thickening, distention, or inflammatory changes. Redundant tortuous colon. Vascular/Lymphatic: No significant vascular findings are present. No enlarged abdominal or pelvic lymph nodes. Reproductive: Status post hysterectomy. No adnexal masses. Other: No abdominal wall hernia or abnormality. No abdominopelvic ascites. Musculoskeletal: No evidence of fracture or suspicious osseous lesions. Soft tissue in the anterior epidural space at L4-L5. IMPRESSION: Cholelithiasis. Soft tissue within the anterior epidural space at L4-L5 may represent a disc extrusion. Given patient's symptoms, MRI of the lumbosacral spine may be considered for better visualization. Electronically Signed   By: Ted Mcalpineobrinka  Dimitrova M.D.   On: 03/04/2017 19:18     Assessment/Plan Principal Problem:   Intractable low back pain Active Problems:   RESTLESS LEG SYNDROME   History of hyperparathyroidism   Hypothyroidism   Back pain    1. Low back pain radiating to the left lower extremity with possible disc extrusion at L4-L5 -MRI of the L-spine has been ordered.  Patient is placed on morphine and Robaxin for now.  Based on MRI results further recommendations probably may need neurosurgery consult. 2. History of hyperparathyroidism status post surgery on calcitriol. 3. Hypothyroidism on Synthroid.   DVT prophylaxis: SCDs. Code Status: Full code. Family Communication: Discussed with patient. Disposition Plan: Home. Consults called: None. Admission status: Observation.   Eduard ClosKAKRAKANDY,Carolle Ishii N. MD [facility] Pager [phone].  If 7PM-7AM, please contact night-coverage [email or web address] Password [email or web address]  03/05/2017, 3:45 AM

## 2017-03-05 NOTE — Progress Notes (Signed)
Patient admitted after midnight, please see H&P.  Pain medications changed to scheduled and PRN.  Schedule robaxin as well.  Steroids.  NS consult for plan going forward- has weakness and numbness in left leg.    Marlin CanaryJessica Vann DO

## 2017-03-05 NOTE — Consult Note (Signed)
Reason for Consult:HNP lumbar Referring Physician: [name] is an 56 y.o. female.  HPI: whom presents with severe pain in the left lower extremity since Monday 03/03/2017. Upon awakening on 11/06 she was barely able to stand. An ambulance was called and she was taken to the ED. 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 I was called for neurosurgical evaluation.   Past Medical History:  Diagnosis Date  . Depression   . GERD (gastroesophageal reflux disease)   . Hyperlipidemia   . Hyperparathyroidism   . Osteopenia   . Stress incontinence     Past Surgical History:  Procedure Laterality Date  . ABDOMINAL HYSTERECTOMY     TAH - endometriosis  . LAPAROSCOPIC ENDOMETRIOSIS FULGURATION    . NASAL SINUS SURGERY  01/03/11  . OOPHORECTOMY    . THYROIDECTOMY, PARTIAL      Family History  Problem Relation Age of Onset  . Coronary artery disease Mother   . Hypertension Mother   . Alcohol abuse Mother   . Depression Mother   . Hypertension Father   . Lung cancer Father   . Stroke Paternal Grandfather   . Diabetes Neg Hx   . Colon cancer Neg Hx   . Breast cancer Neg Hx     Social History:  reports that she has been smoking cigarettes.  she has never used smokeless tobacco. She reports that she drinks alcohol. She reports that she does not use drugs.  Allergies: No Known Allergies  Medications: I have reviewed the patient's current medications.  Results for orders placed or performed during the hospital encounter of 03/04/17 (from the past 48 hour(s))  Comprehensive metabolic panel     Status: Abnormal   Collection Time: 03/04/17  6:25 PM  Result Value Ref Range   Sodium 141 135 - 145 mmol/L   Potassium 3.9 3.5 - 5.1 mmol/L   Chloride 110 101 - 111 mmol/L   CO2 25 22 - 32 mmol/L   Glucose, Bld 113 (H) 65 - 99 mg/dL   BUN 19 6 - 20 mg/dL   Creatinine, Ser 0.85 0.44 - 1.00 mg/dL   Calcium 8.2 (L) 8.9 - 10.3 mg/dL   Total Protein 6.4 (L) 6.5 - 8.1 g/dL    Albumin 3.6 3.5 - 5.0 g/dL   AST 31 15 - 41 U/L   ALT 40 14 - 54 U/L   Alkaline Phosphatase 119 38 - 126 U/L   Total Bilirubin 0.6 0.3 - 1.2 mg/dL   GFR calc non Af Amer >60 >60 mL/min   GFR calc Af Amer >60 >60 mL/min    Comment: (NOTE) The eGFR has been calculated using the CKD EPI equation. This calculation has not been validated in all clinical situations. eGFR's persistently <60 mL/min signify possible Chronic Kidney Disease.    Anion gap 6 5 - 15  Lipase, blood     Status: None   Collection Time: 03/04/17  6:25 PM  Result Value Ref Range   Lipase 31 11 - 51 U/L  CBC with Differential     Status: None   Collection Time: 03/04/17  6:25 PM  Result Value Ref Range   WBC 7.7 4.0 - 10.5 K/uL   RBC 4.08 3.87 - 5.11 MIL/uL   Hemoglobin 12.3 12.0 - 15.0 g/dL   HCT 37.1 36.0 - 46.0 %   MCV 90.9 78.0 - 100.0 fL   MCH 30.1 26.0 - 34.0 pg   MCHC 33.2 30.0 -  36.0 g/dL   RDW 12.2 11.5 - 15.5 %   Platelets 282 150 - 400 K/uL   Neutrophils Relative % 70 %   Neutro Abs 5.4 1.7 - 7.7 K/uL   Lymphocytes Relative 19 %   Lymphs Abs 1.5 0.7 - 4.0 K/uL   Monocytes Relative 8 %   Monocytes Absolute 0.6 0.1 - 1.0 K/uL   Eosinophils Relative 2 %   Eosinophils Absolute 0.1 0.0 - 0.7 K/uL   Basophils Relative 1 %   Basophils Absolute 0.1 0.0 - 0.1 K/uL  Urinalysis, Routine w reflex microscopic     Status: None   Collection Time: 03/05/17  2:54 AM  Result Value Ref Range   Color, Urine YELLOW YELLOW   APPearance CLEAR CLEAR   Specific Gravity, Urine 1.018 1.005 - 1.030   pH 6.0 5.0 - 8.0   Glucose, UA NEGATIVE NEGATIVE mg/dL   Hgb urine dipstick NEGATIVE NEGATIVE   Bilirubin Urine NEGATIVE NEGATIVE   Ketones, ur NEGATIVE NEGATIVE mg/dL   Protein, ur NEGATIVE NEGATIVE mg/dL   Nitrite NEGATIVE NEGATIVE   Leukocytes, UA NEGATIVE NEGATIVE  Basic metabolic panel     Status: Abnormal   Collection Time: 03/05/17  4:53 AM  Result Value Ref Range   Sodium 138 135 - 145 mmol/L   Potassium  3.8 3.5 - 5.1 mmol/L   Chloride 106 101 - 111 mmol/L   CO2 24 22 - 32 mmol/L   Glucose, Bld 139 (H) 65 - 99 mg/dL   BUN 17 6 - 20 mg/dL   Creatinine, Ser 0.87 0.44 - 1.00 mg/dL   Calcium 8.5 (L) 8.9 - 10.3 mg/dL   GFR calc non Af Amer >60 >60 mL/min   GFR calc Af Amer >60 >60 mL/min    Comment: (NOTE) The eGFR has been calculated using the CKD EPI equation. This calculation has not been validated in all clinical situations. eGFR's persistently <60 mL/min signify possible Chronic Kidney Disease.    Anion gap 8 5 - 15  CBC     Status: None   Collection Time: 03/05/17  4:53 AM  Result Value Ref Range   WBC 7.8 4.0 - 10.5 K/uL   RBC 4.23 3.87 - 5.11 MIL/uL   Hemoglobin 12.8 12.0 - 15.0 g/dL   HCT 38.5 36.0 - 46.0 %   MCV 91.0 78.0 - 100.0 fL   MCH 30.3 26.0 - 34.0 pg   MCHC 33.2 30.0 - 36.0 g/dL   RDW 12.5 11.5 - 15.5 %   Platelets 308 150 - 400 K/uL    Mr Lumbar Spine Wo Contrast  Result Date: 03/05/2017 CLINICAL DATA:  Lumbar radiculopathy.  Low back pain left leg pain EXAM: MRI LUMBAR SPINE WITHOUT CONTRAST TECHNIQUE: Multiplanar, multisequence MR imaging of the lumbar spine was performed. No intravenous contrast was administered. COMPARISON:  CT abdomen pelvis 03/04/2017 FINDINGS: Segmentation:  Normal Alignment:  Normal Vertebrae:  Negative for fracture or mass. Conus medullaris: Extends to the T12 level and appears normal. Paraspinal and other soft tissues: Negative for mass or adenopathy Disc levels: L1-2:  Negative L2-3:  Negative L3-4: Mild disc bulging and mild facet degeneration without spinal stenosis L4-5: Large extruded disc fragment centrally and to the left with compression of the left L5 nerve root in the subarticular zone. Thecal sac is compressed and displaced by the disc protrusion. Mild spinal stenosis L5-S1: Mild disc space narrowing and disc degeneration. Mild facet degeneration. Negative for stenosis. IMPRESSION: Large extruded disc fragment centrally and to  the  left at L4-5 with compression of the left L5 nerve root. Electronically Signed   By: Allison Reyes M.D.   On: 03/05/2017 07:53   Ct Renal Stone Study  Result Date: 03/04/2017 CLINICAL DATA:  Severe back pain radiating to the right lower extremity. EXAM: CT ABDOMEN AND PELVIS WITHOUT CONTRAST TECHNIQUE: Multidetector CT imaging of the abdomen and pelvis was performed following the standard protocol without IV contrast. COMPARISON:  None. FINDINGS: Lower chest: No acute abnormality. Hepatobiliary: No focal liver abnormality is seen. Contracted gallbladder with at least 5 gallstones. No evidence of pericholecystic fluid or biliary ductal dilation. Pancreas: Unremarkable. No pancreatic ductal dilatation or surrounding inflammatory changes. Spleen: Normal in size without focal abnormality. Adrenals/Urinary Tract: Adrenal glands are unremarkable. Kidneys are normal, without renal calculi, focal lesion, or hydronephrosis. Bladder is unremarkable. Stomach/Bowel: Stomach is within normal limits. Appendix appears normal. No evidence of bowel wall thickening, distention, or inflammatory changes. Redundant tortuous colon. Vascular/Lymphatic: No significant vascular findings are present. No enlarged abdominal or pelvic lymph nodes. Reproductive: Status post hysterectomy. No adnexal masses. Other: No abdominal wall hernia or abnormality. No abdominopelvic ascites. Musculoskeletal: No evidence of fracture or suspicious osseous lesions. Soft tissue in the anterior epidural space at L4-L5. IMPRESSION: Cholelithiasis. Soft tissue within the anterior epidural space at L4-L5 may represent a disc extrusion. Given patient's symptoms, MRI of the lumbosacral spine may be considered for better visualization. Electronically Signed   By: Fidela Salisbury M.D.   On: 03/04/2017 19:18    Review of Systems  Constitutional: Negative.   HENT: Negative.   Respiratory: Negative.   Cardiovascular: Negative.   Gastrointestinal:  Negative.   Genitourinary: Negative.   Musculoskeletal: Positive for back pain.  Skin: Negative.   Neurological: Positive for focal weakness.  Endo/Heme/Allergies: Negative.   Psychiatric/Behavioral: Negative.    Blood pressure (!) 102/57, pulse 68, temperature 97.9 F (36.6 C), temperature source Oral, resp. rate 16, height '5\' 3"'  (1.6 m), weight 110.2 kg (243 lb), SpO2 94 %. Physical Exam  Constitutional: She is oriented to person, place, and time. She appears well-developed and well-nourished.  HENT:  Head: Normocephalic and atraumatic.  Right Ear: External ear normal.  Left Ear: External ear normal.  Nose: Nose normal.  Mouth/Throat: Oropharynx is clear and moist.  Eyes: Conjunctivae and EOM are normal. Pupils are equal, round, and reactive to light.  Neck: Normal range of motion. Neck supple.  Cardiovascular: Normal rate, regular rhythm, normal heart sounds and intact distal pulses.  Respiratory: Effort normal and breath sounds normal.  GI: Soft. Bowel sounds are normal.  Neurological: She is alert and oriented to person, place, and time. She displays normal reflexes. No cranial nerve deficit or sensory deficit. She exhibits normal muscle tone. Coordination normal. She displays no Babinski's sign on the right side. She displays no Babinski's sign on the left side.  Reflex Scores:      Tricep reflexes are 2+ on the right side and 2+ on the left side.      Bicep reflexes are 2+ on the right side and 2+ on the left side.      Brachioradialis reflexes are 2+ on the right side and 2+ on the left side.      Patellar reflexes are 3+ on the right side and 3+ on the left side.      Achilles reflexes are 2+ on the right side and 2+ on the left side. Patient lying in bed.  Gait not assessed, romberg not assessed Intact  proprioception   Skin: Skin is warm and dry.  Psychiatric: She has a normal mood and affect. Her behavior is normal. Judgment and thought content normal.     Assessment/Plan: I reviewed the case with Mr. And Allison Reyes. I explained that the disc herniation is the reason for her discomfort in the left lower extremity. She has a normal exam, no weakness in the dorsiflexors on the left. She and her husband will consider their options And let me know what they would like to pursue. Given the size of the disc and her discomfort I believe a discetomy is her best option.   Jaevin Medearis L 03/05/2017, 3:27 PM

## 2017-03-06 DIAGNOSIS — M545 Low back pain: Secondary | ICD-10-CM | POA: Diagnosis not present

## 2017-03-06 DIAGNOSIS — Z8639 Personal history of other endocrine, nutritional and metabolic disease: Secondary | ICD-10-CM | POA: Diagnosis not present

## 2017-03-06 DIAGNOSIS — E039 Hypothyroidism, unspecified: Secondary | ICD-10-CM | POA: Diagnosis not present

## 2017-03-06 MED ORDER — BENZONATATE 100 MG PO CAPS: 200.0000 mg | ORAL_CAPSULE | Freq: Three times a day (TID) | ORAL | Status: DC | PRN

## 2017-03-06 NOTE — Progress Notes (Signed)
PROGRESS NOTE    Allison Reyes  ZOX:[id number] DOB: 1961-04-18 DOA: 03/04/2017 PCP: Carlis Stable, PA-C   Outpatient Specialists:    Brief Narrative:  Allison Reyes is a 56 y.o. female with history of hypothyroidism, hyperlipidemia, previous history of hyperparathyroidism status post surgery on calcitriol presents to the ER with complaints of worsening low back pain radiating to the left leg.  Patient symptoms started a day before with mild low back pain which acutely worsened since yesterday morning. MRI shows disc protrusions.  NS consulted and plan is for surgery as patient's pain not controlled and not able to walk.      Assessment & Plan:   Principal Problem:   Intractable low back pain Active Problems:   RESTLESS LEG SYNDROME   History of hyperparathyroidism   Hypothyroidism   Back pain  L5 nerve compression with disc protrusion at L4/L5 -pain control with PO/IV and scheduled meds -plan for surgery -appreciate NS consultation  History of hyperparathyroidism status post surgery on calcitriol.  Hypothyroidism on Synthroid.      DVT prophylaxis:  SCD's  Code Status: Full Code   Family Communication: patient  Disposition Plan:     Consultants:   NS     Subjective: Pain controlled if not moving No fever  Objective: Vitals:   03/05/17 0214 03/05/17 1321 03/05/17 2129 03/06/17 0544  BP: 128/60 (!) 102/57 (!) 110/47 (!) 110/57  Pulse: 68 68 74 70  Resp: 16 16 18 16   Temp: (!) 97.5 F (36.4 C) 97.9 F (36.6 C) 97.7 F (36.5 C) 97.6 F (36.4 C)  TempSrc: Oral Oral Oral Oral  SpO2: 93% 94% 97% 97%  Weight: 110.2 kg (243 lb)     Height: 5\' 3"  (1.6 m)       Intake/Output Summary (Last 24 hours) at 03/06/2017 1201 Last data filed at 03/06/2017 0945 Gross per 24 hour  Intake 220 ml  Output 900 ml  Net -680 ml   Filed Weights   03/04/17 1653 03/05/17 0214  Weight: 106.6 kg (235 lb) 110.2 kg (243 lb)     Examination:  General exam: Appears calm and comfortable  Respiratory system: Clear to auscultation. Respiratory effort normal. Cardiovascular system: S1 & S2 heard, RRR. No JVD, murmurs, rubs, gallops or clicks. No pedal edema. Gastrointestinal system: Abdomen is nondistended, soft and nontender. No organomegaly or masses felt. Normal bowel sounds heard. Central nervous system: Alert and oriented. No focal neurological deficits. Extremities: Symmetric 5 x 5 power. Skin: No rashes, lesions or ulcers Psychiatry: Judgement and insight appear normal. Mood & affect appropriate.     Data Reviewed: I have personally reviewed following labs and imaging studies  CBC: Recent Labs  Lab 03/04/17 1825 03/05/17 0453  WBC 7.7 7.8  NEUTROABS 5.4  --   HGB 12.3 12.8  HCT 37.1 38.5  MCV 90.9 91.0  PLT 282 308   Basic Metabolic Panel: Recent Labs  Lab 03/04/17 1825 03/05/17 0453  NA 141 138  K 3.9 3.8  CL 110 106  CO2 25 24  GLUCOSE 113* 139*  BUN 19 17  CREATININE 0.85 0.87  CALCIUM 8.2* 8.5*   GFR: Estimated Creatinine Clearance: 86.1 mL/min (by C-G formula based on SCr of 0.87 mg/dL). Liver Function Tests: Recent Labs  Lab 03/04/17 1825  AST 31  ALT 40  ALKPHOS 119  BILITOT 0.6  PROT 6.4*  ALBUMIN 3.6   Recent Labs  Lab 03/04/17 1825  LIPASE 31   No results for input(s): AMMONIA  in the last 168 hours. Coagulation Profile: No results for input(s): INR, PROTIME in the last 168 hours. Cardiac Enzymes: No results for input(s): CKTOTAL, CKMB, CKMBINDEX, TROPONINI in the last 168 hours. BNP (last 3 results) No results for input(s): PROBNP in the last 8760 hours. HbA1C: No results for input(s): HGBA1C in the last 72 hours. CBG: No results for input(s): GLUCAP in the last 168 hours. Lipid Profile: No results for input(s): CHOL, HDL, LDLCALC, TRIG, CHOLHDL, LDLDIRECT in the last 72 hours. Thyroid Function Tests: No results for input(s): TSH, T4TOTAL, FREET4,  T3FREE, THYROIDAB in the last 72 hours. Anemia Panel: No results for input(s): VITAMINB12, FOLATE, FERRITIN, TIBC, IRON, RETICCTPCT in the last 72 hours. Urine analysis:    Component Value Date/Time   COLORURINE YELLOW 03/05/2017 0254   APPEARANCEUR CLEAR 03/05/2017 0254   LABSPEC 1.018 03/05/2017 0254   PHURINE 6.0 03/05/2017 0254   GLUCOSEU NEGATIVE 03/05/2017 0254   HGBUR NEGATIVE 03/05/2017 0254   HGBUR negative 03/30/2010 1541   BILIRUBINUR NEGATIVE 03/05/2017 0254   BILIRUBINUR neg 08/06/2016 1634   KETONESUR NEGATIVE 03/05/2017 0254   PROTEINUR NEGATIVE 03/05/2017 0254   UROBILINOGEN 0.2 08/06/2016 1634   UROBILINOGEN 0.2 03/30/2010 1541   NITRITE NEGATIVE 03/05/2017 0254   LEUKOCYTESUR NEGATIVE 03/05/2017 0254     )No results found for this or any previous visit (from the past 240 hour(s)).    Anti-infectives (From admission, onward)   None       Radiology Studies: Mr Lumbar Spine Wo Contrast  Result Date: 03/05/2017 CLINICAL DATA:  Lumbar radiculopathy.  Low back pain left leg pain EXAM: MRI LUMBAR SPINE WITHOUT CONTRAST TECHNIQUE: Multiplanar, multisequence MR imaging of the lumbar spine was performed. No intravenous contrast was administered. COMPARISON:  CT abdomen pelvis 03/04/2017 FINDINGS: Segmentation:  Normal Alignment:  Normal Vertebrae:  Negative for fracture or mass. Conus medullaris: Extends to the T12 level and appears normal. Paraspinal and other soft tissues: Negative for mass or adenopathy Disc levels: L1-2:  Negative L2-3:  Negative L3-4: Mild disc bulging and mild facet degeneration without spinal stenosis L4-5: Large extruded disc fragment centrally and to the left with compression of the left L5 nerve root in the subarticular zone. Thecal sac is compressed and displaced by the disc protrusion. Mild spinal stenosis L5-S1: Mild disc space narrowing and disc degeneration. Mild facet degeneration. Negative for stenosis. IMPRESSION: Large extruded disc  fragment centrally and to the left at L4-5 with compression of the left L5 nerve root. Electronically Signed   By: Marlan Palauharles  Clark M.D.   On: 03/05/2017 07:53   Ct Renal Stone Study  Result Date: 03/04/2017 CLINICAL DATA:  Severe back pain radiating to the right lower extremity. EXAM: CT ABDOMEN AND PELVIS WITHOUT CONTRAST TECHNIQUE: Multidetector CT imaging of the abdomen and pelvis was performed following the standard protocol without IV contrast. COMPARISON:  None. FINDINGS: Lower chest: No acute abnormality. Hepatobiliary: No focal liver abnormality is seen. Contracted gallbladder with at least 5 gallstones. No evidence of pericholecystic fluid or biliary ductal dilation. Pancreas: Unremarkable. No pancreatic ductal dilatation or surrounding inflammatory changes. Spleen: Normal in size without focal abnormality. Adrenals/Urinary Tract: Adrenal glands are unremarkable. Kidneys are normal, without renal calculi, focal lesion, or hydronephrosis. Bladder is unremarkable. Stomach/Bowel: Stomach is within normal limits. Appendix appears normal. No evidence of bowel wall thickening, distention, or inflammatory changes. Redundant tortuous colon. Vascular/Lymphatic: No significant vascular findings are present. No enlarged abdominal or pelvic lymph nodes. Reproductive: Status post hysterectomy. No adnexal masses. Other:  No abdominal wall hernia or abnormality. No abdominopelvic ascites. Musculoskeletal: No evidence of fracture or suspicious osseous lesions. Soft tissue in the anterior epidural space at L4-L5. IMPRESSION: Cholelithiasis. Soft tissue within the anterior epidural space at L4-L5 may represent a disc extrusion. Given patient's symptoms, MRI of the lumbosacral spine may be considered for better visualization. Electronically Signed   By: Ted Mcalpineobrinka  Dimitrova M.D.   On: 03/04/2017 19:18        Scheduled Meds: . atorvastatin  40 mg Oral QHS  . buPROPion  150 mg Oral Daily  . calcitRIOL  0.5 mcg Oral  Daily  . calcitRIOL  1 mcg Oral QHS  . citalopram  40 mg Oral Daily  . dexamethasone  4 mg Oral Q12H  . docusate sodium  100 mg Oral BID  . fluticasone  2 spray Each Nare Daily  . ketorolac  30 mg Intravenous Q8H  . levothyroxine  112 mcg Oral QAC breakfast  . loratadine  10 mg Oral Daily  . methocarbamol  500 mg Oral Q6H  . pantoprazole  40 mg Oral Daily   Continuous Infusions:   LOS: 0 days    Time spent: 25 min    Sukari Grist U Bryer Cozzolino, DO [facility] Pager [phone]  If 7PM-7AM, please contact night-coverage [email or web address] Password [email or web address] 03/06/2017, 12:01 PM

## 2017-03-07 ENCOUNTER — Observation Stay (HOSPITAL_COMMUNITY): Payer: BLUE CROSS/BLUE SHIELD | Admitting: Anesthesiology

## 2017-03-07 ENCOUNTER — Encounter (HOSPITAL_COMMUNITY)
Admission: EM | Disposition: A | Discharge status: Home Health | Payer: Self-pay | Source: Home / Self Care | Attending: Internal Medicine

## 2017-03-07 ENCOUNTER — Observation Stay (HOSPITAL_COMMUNITY): Payer: BLUE CROSS/BLUE SHIELD

## 2017-03-07 DIAGNOSIS — M5126 Other intervertebral disc displacement, lumbar region: Secondary | ICD-10-CM

## 2017-03-07 DIAGNOSIS — E039 Hypothyroidism, unspecified: Secondary | ICD-10-CM

## 2017-03-07 DIAGNOSIS — M5442 Lumbago with sciatica, left side: Secondary | ICD-10-CM

## 2017-03-07 DIAGNOSIS — Z8639 Personal history of other endocrine, nutritional and metabolic disease: Secondary | ICD-10-CM

## 2017-03-07 DIAGNOSIS — M545 Low back pain: Secondary | ICD-10-CM | POA: Diagnosis not present

## 2017-03-07 DIAGNOSIS — G2581 Restless legs syndrome: Secondary | ICD-10-CM

## 2017-03-07 DIAGNOSIS — M5431 Sciatica, right side: Secondary | ICD-10-CM

## 2017-03-07 DIAGNOSIS — G8929 Other chronic pain: Secondary | ICD-10-CM

## 2017-03-07 DIAGNOSIS — M4326 Fusion of spine, lumbar region: Secondary | ICD-10-CM | POA: Diagnosis not present

## 2017-03-07 DIAGNOSIS — M549 Dorsalgia, unspecified: Secondary | ICD-10-CM | POA: Diagnosis not present

## 2017-03-07 DIAGNOSIS — K219 Gastro-esophageal reflux disease without esophagitis: Secondary | ICD-10-CM | POA: Diagnosis not present

## 2017-03-07 DIAGNOSIS — E785 Hyperlipidemia, unspecified: Secondary | ICD-10-CM | POA: Diagnosis not present

## 2017-03-07 HISTORY — PX: LUMBAR LAMINECTOMY/DECOMPRESSION MICRODISCECTOMY: SHX5026

## 2017-03-07 LAB — SURGICAL PCR SCREEN
MRSA, PCR: NEGATIVE
Staphylococcus aureus: NEGATIVE

## 2017-03-07 SURGERY — LUMBAR LAMINECTOMY/DECOMPRESSION MICRODISCECTOMY 1 LEVEL
Anesthesia: General | Laterality: Left

## 2017-03-07 MED ORDER — OXYCODONE HCL 5 MG/5ML PO SOLN: 5.0000 mg | Freq: Once | ORAL | Status: DC | PRN

## 2017-03-07 MED ORDER — SUGAMMADEX SODIUM 200 MG/2ML IV SOLN
INTRAVENOUS | Status: AC
  Filled 2017-03-07: qty 2

## 2017-03-07 MED ORDER — FENTANYL CITRATE (PF) 250 MCG/5ML IJ SOLN
INTRAMUSCULAR | Status: DC | PRN
  Administered 2017-03-07 (×2): 50 ug via INTRAVENOUS

## 2017-03-07 MED ORDER — ROCURONIUM BROMIDE 10 MG/ML (PF) SYRINGE
PREFILLED_SYRINGE | INTRAVENOUS | Status: DC | PRN
  Administered 2017-03-07: 60 mg via INTRAVENOUS

## 2017-03-07 MED ORDER — THROMBIN (RECOMBINANT) 5000 UNITS EX SOLR
CUTANEOUS | Status: DC | PRN
  Administered 2017-03-07 (×2): 5000 [IU] via TOPICAL

## 2017-03-07 MED ORDER — LIDOCAINE 2% (20 MG/ML) 5 ML SYRINGE
INTRAMUSCULAR | Status: DC | PRN
  Administered 2017-03-07: 90 mg via INTRAVENOUS

## 2017-03-07 MED ORDER — SODIUM CHLORIDE 0.9 % IV SOLN: 250.0000 mL | INTRAVENOUS | Status: DC

## 2017-03-07 MED ORDER — HYDROMORPHONE HCL 1 MG/ML IJ SOLN: 0.2500 mg | INTRAMUSCULAR | Status: DC | PRN

## 2017-03-07 MED ORDER — EPHEDRINE 5 MG/ML INJ
INTRAVENOUS | Status: AC
  Filled 2017-03-07: qty 10

## 2017-03-07 MED ORDER — MIDAZOLAM HCL 2 MG/2ML IJ SOLN
INTRAMUSCULAR | Status: AC
  Filled 2017-03-07: qty 2

## 2017-03-07 MED ORDER — DEXAMETHASONE SODIUM PHOSPHATE 10 MG/ML IJ SOLN
INTRAMUSCULAR | Status: AC
  Filled 2017-03-07: qty 1

## 2017-03-07 MED ORDER — THROMBIN (RECOMBINANT) 5000 UNITS EX SOLR
CUTANEOUS | Status: AC
  Filled 2017-03-07: qty 10000

## 2017-03-07 MED ORDER — FENTANYL CITRATE (PF) 100 MCG/2ML IJ SOLN
INTRAMUSCULAR | Status: DC | PRN
  Administered 2017-03-07: 200 ug via INTRAVENOUS

## 2017-03-07 MED ORDER — CEFAZOLIN SODIUM-DEXTROSE 2-4 GM/100ML-% IV SOLN
2.0000 g | Freq: Three times a day (TID) | INTRAVENOUS | Status: AC
  Administered 2017-03-07: 2 g via INTRAVENOUS
  Filled 2017-03-07: qty 100

## 2017-03-07 MED ORDER — SUGAMMADEX SODIUM 500 MG/5ML IV SOLN
INTRAVENOUS | Status: AC
  Filled 2017-03-07: qty 5

## 2017-03-07 MED ORDER — EPHEDRINE SULFATE-NACL 50-0.9 MG/10ML-% IV SOSY
PREFILLED_SYRINGE | INTRAVENOUS | Status: DC | PRN
  Administered 2017-03-07 (×2): 5 mg via INTRAVENOUS
  Administered 2017-03-07 (×2): 10 mg via INTRAVENOUS

## 2017-03-07 MED ORDER — ROCURONIUM BROMIDE 10 MG/ML (PF) SYRINGE
PREFILLED_SYRINGE | INTRAVENOUS | Status: AC
  Filled 2017-03-07: qty 15

## 2017-03-07 MED ORDER — HEMOSTATIC AGENTS (NO CHARGE) OPTIME
TOPICAL | Status: DC | PRN
  Administered 2017-03-07: 1 via TOPICAL

## 2017-03-07 MED ORDER — SODIUM CHLORIDE 0.9% FLUSH: 3.0000 mL | INTRAVENOUS | Status: DC | PRN

## 2017-03-07 MED ORDER — PHENOL 1.4 % MT LIQD: 1.0000 | OROMUCOSAL | Status: DC | PRN

## 2017-03-07 MED ORDER — CEFAZOLIN SODIUM-DEXTROSE 2-4 GM/100ML-% IV SOLN
INTRAVENOUS | Status: AC
  Filled 2017-03-07: qty 100

## 2017-03-07 MED ORDER — LIDOCAINE-EPINEPHRINE 0.5 %-1:200000 IJ SOLN
INTRAMUSCULAR | Status: AC
  Filled 2017-03-07: qty 1

## 2017-03-07 MED ORDER — LIDOCAINE 2% (20 MG/ML) 5 ML SYRINGE
INTRAMUSCULAR | Status: AC
  Filled 2017-03-07: qty 15

## 2017-03-07 MED ORDER — METHYLPREDNISOLONE ACETATE 80 MG/ML IJ SUSP
INTRAMUSCULAR | Status: DC | PRN
  Administered 2017-03-07: 80 mg

## 2017-03-07 MED ORDER — LIDOCAINE-EPINEPHRINE 0.5 %-1:200000 IJ SOLN
INTRAMUSCULAR | Status: DC | PRN
  Administered 2017-03-07: 10 mL

## 2017-03-07 MED ORDER — 0.9 % SODIUM CHLORIDE (POUR BTL) OPTIME
TOPICAL | Status: DC | PRN
  Administered 2017-03-07: 1000 mL

## 2017-03-07 MED ORDER — METHYLPREDNISOLONE ACETATE 80 MG/ML IJ SUSP
INTRAMUSCULAR | Status: AC
  Filled 2017-03-07: qty 1

## 2017-03-07 MED ORDER — OXYCODONE HCL 5 MG PO TABS: 5.0000 mg | ORAL_TABLET | Freq: Once | ORAL | Status: DC | PRN

## 2017-03-07 MED ORDER — MENTHOL 3 MG MT LOZG: 1.0000 | LOZENGE | OROMUCOSAL | Status: DC | PRN

## 2017-03-07 MED ORDER — ONDANSETRON HCL 4 MG/2ML IJ SOLN
INTRAMUSCULAR | Status: AC
  Filled 2017-03-07: qty 6

## 2017-03-07 MED ORDER — KETOROLAC TROMETHAMINE 30 MG/ML IJ SOLN
INTRAMUSCULAR | Status: AC
  Filled 2017-03-07: qty 1

## 2017-03-07 MED ORDER — DIPHENHYDRAMINE HCL 50 MG/ML IJ SOLN
INTRAMUSCULAR | Status: AC
  Filled 2017-03-07: qty 1

## 2017-03-07 MED ORDER — FENTANYL CITRATE (PF) 100 MCG/2ML IJ SOLN
INTRAMUSCULAR | Status: AC
  Filled 2017-03-07: qty 2

## 2017-03-07 MED ORDER — MIDAZOLAM HCL 2 MG/2ML IJ SOLN
INTRAMUSCULAR | Status: DC | PRN
Start: 2017-03-07 — End: 2017-03-07
  Administered 2017-03-07: 2 mg via INTRAVENOUS

## 2017-03-07 MED ORDER — POTASSIUM CHLORIDE IN NACL 20-0.9 MEQ/L-% IV SOLN
INTRAVENOUS | Status: DC
  Filled 2017-03-07: qty 1000

## 2017-03-07 MED ORDER — PHENYLEPHRINE 40 MCG/ML (10ML) SYRINGE FOR IV PUSH (FOR BLOOD PRESSURE SUPPORT)
PREFILLED_SYRINGE | INTRAVENOUS | Status: AC
  Filled 2017-03-07: qty 10

## 2017-03-07 MED ORDER — ONDANSETRON HCL 4 MG/2ML IJ SOLN
INTRAMUSCULAR | Status: AC
  Filled 2017-03-07: qty 2

## 2017-03-07 MED ORDER — ONDANSETRON HCL 4 MG/2ML IJ SOLN
INTRAMUSCULAR | Status: DC | PRN
  Administered 2017-03-07: 4 mg via INTRAVENOUS

## 2017-03-07 MED ORDER — DIPHENHYDRAMINE HCL 50 MG/ML IJ SOLN
INTRAMUSCULAR | Status: DC | PRN
  Administered 2017-03-07: 25 mg via INTRAVENOUS

## 2017-03-07 MED ORDER — PROPOFOL 10 MG/ML IV BOLUS
INTRAVENOUS | Status: AC
  Filled 2017-03-07: qty 20

## 2017-03-07 MED ORDER — PROPOFOL 10 MG/ML IV BOLUS
INTRAVENOUS | Status: DC | PRN
  Administered 2017-03-07: 150 mg via INTRAVENOUS

## 2017-03-07 MED ORDER — LACTATED RINGERS IV SOLN
INTRAVENOUS | Status: DC
  Administered 2017-03-07 (×2): via INTRAVENOUS

## 2017-03-07 MED ORDER — FENTANYL CITRATE (PF) 250 MCG/5ML IJ SOLN
INTRAMUSCULAR | Status: AC
  Filled 2017-03-07: qty 5

## 2017-03-07 MED ORDER — SODIUM CHLORIDE 0.9% FLUSH
3.0000 mL | Freq: Two times a day (BID) | INTRAVENOUS | Status: DC
  Administered 2017-03-07: 3 mL via INTRAVENOUS

## 2017-03-07 SURGICAL SUPPLY — 55 items
ADH SKN CLS APL DERMABOND .7 (GAUZE/BANDAGES/DRESSINGS) ×1
APL SKNCLS STERI-STRIP NONHPOA (GAUZE/BANDAGES/DRESSINGS)
BAG DECANTER FOR FLEXI CONT (MISCELLANEOUS) ×1 IMPLANT
BENZOIN TINCTURE PRP APPL 2/3 (GAUZE/BANDAGES/DRESSINGS) IMPLANT
BLADE CLIPPER SURG (BLADE) IMPLANT
BUR MATCHSTICK NEURO 3.0 LAGG (BURR) ×2 IMPLANT
BUR PRECISION FLUTE 5.0 (BURR) IMPLANT
CANISTER SUCT 3000ML PPV (MISCELLANEOUS) ×2 IMPLANT
CARTRIDGE OIL MAESTRO DRILL (MISCELLANEOUS) ×1 IMPLANT
DECANTER SPIKE VIAL GLASS SM (MISCELLANEOUS) ×2 IMPLANT
DERMABOND ADVANCED (GAUZE/BANDAGES/DRESSINGS) ×1
DERMABOND ADVANCED .7 DNX12 (GAUZE/BANDAGES/DRESSINGS) ×1 IMPLANT
DIFFUSER DRILL AIR PNEUMATIC (MISCELLANEOUS) ×2 IMPLANT
DRAPE LAPAROTOMY 100X72X124 (DRAPES) ×2 IMPLANT
DRAPE MICROSCOPE LEICA (MISCELLANEOUS) ×2 IMPLANT
DRAPE POUCH INSTRU U-SHP 10X18 (DRAPES) ×2 IMPLANT
DRAPE SURG 17X23 STRL (DRAPES) ×2 IMPLANT
DURAPREP 26ML APPLICATOR (WOUND CARE) ×2 IMPLANT
ELECT BLADE 4.0 EZ CLEAN MEGAD (MISCELLANEOUS) ×2
ELECT REM PT RETURN 9FT ADLT (ELECTROSURGICAL) ×2
ELECTRODE BLDE 4.0 EZ CLN MEGD (MISCELLANEOUS) IMPLANT
ELECTRODE REM PT RTRN 9FT ADLT (ELECTROSURGICAL) ×1 IMPLANT
GAUZE SPONGE 4X4 12PLY STRL (GAUZE/BANDAGES/DRESSINGS) IMPLANT
GAUZE SPONGE 4X4 16PLY XRAY LF (GAUZE/BANDAGES/DRESSINGS) IMPLANT
GLOVE ECLIPSE 6.5 STRL STRAW (GLOVE) ×2 IMPLANT
GLOVE EXAM NITRILE LRG STRL (GLOVE) IMPLANT
GLOVE EXAM NITRILE XL STR (GLOVE) IMPLANT
GLOVE EXAM NITRILE XS STR PU (GLOVE) IMPLANT
GOWN STRL REUS W/ TWL LRG LVL3 (GOWN DISPOSABLE) ×2 IMPLANT
GOWN STRL REUS W/ TWL XL LVL3 (GOWN DISPOSABLE) IMPLANT
GOWN STRL REUS W/TWL 2XL LVL3 (GOWN DISPOSABLE) IMPLANT
GOWN STRL REUS W/TWL LRG LVL3 (GOWN DISPOSABLE) ×4
GOWN STRL REUS W/TWL XL LVL3 (GOWN DISPOSABLE)
KIT BASIN OR (CUSTOM PROCEDURE TRAY) ×2 IMPLANT
KIT ROOM TURNOVER OR (KITS) ×2 IMPLANT
NDL HYPO 25X1 1.5 SAFETY (NEEDLE) ×1 IMPLANT
NDL SPNL 18GX3.5 QUINCKE PK (NEEDLE) IMPLANT
NEEDLE HYPO 25X1 1.5 SAFETY (NEEDLE) ×2 IMPLANT
NEEDLE SPNL 18GX3.5 QUINCKE PK (NEEDLE) IMPLANT
NS IRRIG 1000ML POUR BTL (IV SOLUTION) ×2 IMPLANT
OIL CARTRIDGE MAESTRO DRILL (MISCELLANEOUS) ×2
PACK LAMINECTOMY NEURO (CUSTOM PROCEDURE TRAY) ×2 IMPLANT
PAD ARMBOARD 7.5X6 YLW CONV (MISCELLANEOUS) ×6 IMPLANT
POSITIONER BODY (SOFTGOODS) ×1 IMPLANT
RUBBERBAND STERILE (MISCELLANEOUS) ×4 IMPLANT
SPONGE LAP 4X18 X RAY DECT (DISPOSABLE) IMPLANT
SPONGE SURGIFOAM ABS GEL SZ50 (HEMOSTASIS) ×2 IMPLANT
STRIP CLOSURE SKIN 1/2X4 (GAUZE/BANDAGES/DRESSINGS) IMPLANT
SUT VIC AB 0 CT1 18XCR BRD8 (SUTURE) ×1 IMPLANT
SUT VIC AB 0 CT1 8-18 (SUTURE) ×2
SUT VIC AB 2-0 CT1 18 (SUTURE) ×2 IMPLANT
SUT VIC AB 3-0 SH 8-18 (SUTURE) ×2 IMPLANT
TOWEL GREEN STERILE (TOWEL DISPOSABLE) ×2 IMPLANT
TOWEL GREEN STERILE FF (TOWEL DISPOSABLE) ×2 IMPLANT
WATER STERILE IRR 1000ML POUR (IV SOLUTION) ×2 IMPLANT

## 2017-03-07 NOTE — Transfer of Care (Signed)
Immediate Anesthesia Transfer of Care Note  Patient: Sherita Cottrill  Procedure(s) Performed: L4-5 Discectomy (Left )  Patient Location: PACU  Anesthesia Type:General  Level of Consciousness: awake, oriented and patient cooperative  Airway & Oxygen Therapy: Patient Spontanous Breathing and Patient connected to nasal cannula oxygen  Post-op Assessment: Report given to RN, Post -op Vital signs reviewed and stable and Patient moving all extremities  Post vital signs: Reviewed and stable  Last Vitals:  Vitals:   03/06/17 2237 03/07/17 0515  BP: 129/72 (!) 141/60  Pulse: 65 (!) 56  Resp: 18 15  Temp: 36.7 C 36.8 C  SpO2: 97% 97%    Last Pain:  Vitals:   03/07/17 0900  TempSrc:   PainSc: 7       Patients Stated Pain Goal: 1 (03/07/17 1146)  Complications: No apparent anesthesia complications

## 2017-03-07 NOTE — Op Note (Signed)
03/07/2017  3:33 PM  PATIENT:  Marisue HumbleMaureen Monrroy  56 y.o. 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 Hellwig was taken to the operating room, intubated and placed under a general anesthetic without difficulty. She was positioned prone on a Jackson table with all pressure points padded. Her back was prepped and draped in a sterile manner. I opened the skin with a 10 blade and carried the dissection down to the thoracolumbar fascia. I used both sharp dissection and the monopolar cautery to expose the lamina of L4, and L5. I confirmed my location with an intraoperative xray.  I used the drill, Kerrison punches, and curettes to perform a semihemilaminectomy of L4. I used the punches to remove the ligamentum flavum to expose the thecal sac. I brought the microscope into the operative field and with Dr.Pool's assistance we started our decompression of the spinal canal, thecal sac and L5 root(s). I cauterized epidural veins overlying the disc space then divided them sharply. I opened the disc space with a 15 blade and proceeded with the discectomy. I used pituitary rongeurs, curettes, and other instruments to remove disc material. After the discectomy was completed we inspected the Left L5 nerve root and felt it was well decompressed. I explored rostrally, laterally, medially, and caudally and was satisfied with the decompression. I irrigated the wound, then closed in layers. I approximated the thoracolumbar fascia, subcutaneous, and subcuticular planes with vicryl sutures. I  used dermabond for a sterile dressing.   PLAN OF CARE: Admit to inpatient   PATIENT DISPOSITION:  PACU - hemodynamically stable.   Delay start of Pharmacological VTE agent (>24hrs) due to surgical blood loss or risk of bleeding:  yes

## 2017-03-07 NOTE — Progress Notes (Signed)
PROGRESS NOTE  Allison HousemanMaureen Reyes ZOX:[id number] DOB: 10/13/1960 DOA: 03/04/2017 PCP: Carlis Stableummings, Charley Elizabeth, PA-C  HPI/Recap of past 24 hours: Patient seen and examined with family members at her bedside. Reports numbness on her left lower extremity. She is scheduled for neurosurgery today.  Assessment/Plan: Principal Problem:   Intractable low back pain Active Problems:   RESTLESS LEG SYNDROME   History of hyperparathyroidism   Hypothyroidism   Back pain   Code Status: Full  Family Communication: Family members  Disposition Plan: Neurosurgery planned today then will be managed under neurosurgery team.    Consultants:  Neurosurgery  Procedures:  Neurosurgery planned  Antimicrobials:  None  DVT prophylaxis:  SCDs   Objective: Vitals:   03/06/17 0544 03/06/17 1500 03/06/17 2237 03/07/17 0515  BP: (!) 110/57 126/66 129/72 (!) 141/60  Pulse: 70 80 65 (!) 56  Resp: 16 16 18 15   Temp: 97.6 F (36.4 C) 98.1 F (36.7 C) 98.1 F (36.7 C) 98.3 F (36.8 C)  TempSrc: Oral Oral Oral Oral  SpO2: 97% 97% 97% 97%  Weight:      Height:        Intake/Output Summary (Last 24 hours) at 03/07/2017 1113 Last data filed at 03/07/2017 0500 Gross per 24 hour  Intake -  Output 1700 ml  Net -1700 ml   Filed Weights   03/04/17 1653 03/05/17 0214  Weight: 106.6 kg (235 lb) 110.2 kg (243 lb)    Exam:   General:  56 yo CF WD WN in NAD  Cardiovascular: RRR with no murmurs rubs or gallops   Respiratory: CTA no wheezes, rales or rhonchi  Abdomen: Soft obese non tender with NBS x 4 quadrants  Musculoskeletal: No deformities moves all 4 limbs freely  Skin: Intact  Psychiatry: Mood is appropriate for condition and setting.   Data Reviewed: CBC: Recent Labs  Lab 03/04/17 1825 03/05/17 0453  WBC 7.7 7.8  NEUTROABS 5.4  --   HGB 12.3 12.8  HCT 37.1 38.5  MCV 90.9 91.0  PLT 282 308   Basic Metabolic Panel: Recent Labs  Lab 03/04/17 1825 03/05/17 0453    NA 141 138  K 3.9 3.8  CL 110 106  CO2 25 24  GLUCOSE 113* 139*  BUN 19 17  CREATININE 0.85 0.87  CALCIUM 8.2* 8.5*   GFR: Estimated Creatinine Clearance: 86.1 mL/min (by C-G formula based on SCr of 0.87 mg/dL). Liver Function Tests: Recent Labs  Lab 03/04/17 1825  AST 31  ALT 40  ALKPHOS 119  BILITOT 0.6  PROT 6.4*  ALBUMIN 3.6   Recent Labs  Lab 03/04/17 1825  LIPASE 31   No results for input(s): AMMONIA in the last 168 hours. Coagulation Profile: No results for input(s): INR, PROTIME in the last 168 hours. Cardiac Enzymes: No results for input(s): CKTOTAL, CKMB, CKMBINDEX, TROPONINI in the last 168 hours. BNP (last 3 results) No results for input(s): PROBNP in the last 8760 hours. HbA1C: No results for input(s): HGBA1C in the last 72 hours. CBG: No results for input(s): GLUCAP in the last 168 hours. Lipid Profile: No results for input(s): CHOL, HDL, LDLCALC, TRIG, CHOLHDL, LDLDIRECT in the last 72 hours. Thyroid Function Tests: No results for input(s): TSH, T4TOTAL, FREET4, T3FREE, THYROIDAB in the last 72 hours. Anemia Panel: No results for input(s): VITAMINB12, FOLATE, FERRITIN, TIBC, IRON, RETICCTPCT in the last 72 hours. Urine analysis:    Component Value Date/Time   COLORURINE YELLOW 03/05/2017 0254   APPEARANCEUR CLEAR 03/05/2017 0254  LABSPEC 1.018 03/05/2017 0254   PHURINE 6.0 03/05/2017 0254   GLUCOSEU NEGATIVE 03/05/2017 0254   HGBUR NEGATIVE 03/05/2017 0254   HGBUR negative 03/30/2010 1541   BILIRUBINUR NEGATIVE 03/05/2017 0254   BILIRUBINUR neg 08/06/2016 1634   KETONESUR NEGATIVE 03/05/2017 0254   PROTEINUR NEGATIVE 03/05/2017 0254   UROBILINOGEN 0.2 08/06/2016 1634   UROBILINOGEN 0.2 03/30/2010 1541   NITRITE NEGATIVE 03/05/2017 0254   LEUKOCYTESUR NEGATIVE 03/05/2017 0254   Sepsis Labs: @LABRCNTIP (procalcitonin:4,lacticidven:4)  )No results found for this or any previous visit (from the past 240 hour(s)).    Studies: No  results found.  Scheduled Meds: . atorvastatin  40 mg Oral QHS  . buPROPion  150 mg Oral Daily  . calcitRIOL  0.5 mcg Oral Daily  . calcitRIOL  1 mcg Oral QHS  . citalopram  40 mg Oral Daily  . dexamethasone  4 mg Oral Q12H  . docusate sodium  100 mg Oral BID  . fluticasone  2 spray Each Nare Daily  . ketorolac  30 mg Intravenous Q8H  . levothyroxine  112 mcg Oral QAC breakfast  . loratadine  10 mg Oral Daily  . methocarbamol  500 mg Oral Q6H  . pantoprazole  40 mg Oral Daily    Continuous Infusions:   LOS: 0 days   1. 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 . -Patient is placed on IV morphine and Robaxin for now.   -neurosurgery following with procedure planned today  2. History of hyperparathyroidism s/p parathyroidectomy -calcitriol.  3. Hypothyroidism -Synthroid   Allison Droparole N Jacoby Zanni, MD [facility] Pager [phone]  If 7PM-7AM, please contact night-coverage [email or web address] Password [email or web address] 03/07/2017, 11:13 AM

## 2017-03-07 NOTE — Care Management Note (Signed)
Case Management Note  Patient Details  Name: Allison Reyes MRN: [id number] Date of Birth: 11/05/1960  Subjective/Objective:              Presents with intractable low back pain, hx of hypothyroidism, hyperlipidemia, hyperparathyroidism. From home with husband. PTA independent with ADL's, no DME usage.   Allison Reyes (Spouse)     [phone]          PCP: Doran HeaterElizabeth Cummings  Action/Plan: Pt with disc herniation....discetomy scheduled for today (11/9) .... CM will continue to follow for disposition needs.  Expected Discharge Date:                  Expected Discharge Plan:  Home/Self Care(Resides with husband)  In-House Referral:     Discharge planning Services  CM Consult  Post Acute Care Choice:    Choice offered to:     DME Arranged:    DME Agency:     HH Arranged:    HH Agency:     Status of Service:  In process, will continue to follow  If discussed at Long Length of Stay Meetings, dates discussed:    Additional Comments:  Epifanio LeschesCole, Fumi Guadron Hudson, RN 03/07/2017, 10:18 AM

## 2017-03-07 NOTE — Progress Notes (Signed)
Report received from [place], [place] [id number].

## 2017-03-07 NOTE — Anesthesia Preprocedure Evaluation (Signed)
Anesthesia Evaluation  Patient identified by MRN, date of birth, ID band Patient awake    Reviewed: Allergy & Precautions, NPO status , Patient's Chart, lab work & pertinent test results  Airway Mallampati: II  TM Distance: >3 FB Neck ROM: Full    Dental no notable dental hx. (+) Teeth Intact   Pulmonary Current Smoker,    breath sounds clear to auscultation       Cardiovascular  Rhythm:Regular Rate:Normal     Neuro/Psych    GI/Hepatic Neg liver ROS, GERD  ,  Endo/Other  Hypothyroidism Morbid obesity  Renal/GU negative Renal ROS     Musculoskeletal Ruptured disc   Abdominal   Peds  Hematology negative hematology ROS (+)   Anesthesia Other Findings   Reproductive/Obstetrics                             Anesthesia Physical Anesthesia Plan  ASA: II  Anesthesia Plan: General   Post-op Pain Management:    Induction: Intravenous  PONV Risk Score and Plan: 3 and Treatment may vary due to age or medical condition, Dexamethasone, Ondansetron and Midazolam  Airway Management Planned: Oral ETT  Additional Equipment:   Intra-op Plan:   Post-operative Plan: Extubation in OR  Informed Consent: I have reviewed the patients History and Physical, chart, labs and discussed the procedure including the risks, benefits and alternatives for the proposed anesthesia with the patient or authorized representative who has indicated his/her understanding and acceptance.     Plan Discussed with:   Anesthesia Plan Comments:         Anesthesia Quick Evaluation

## 2017-03-07 NOTE — Anesthesia Procedure Notes (Signed)
Procedure Name: Intubation Date/Time: 03/07/2017 1:16 PM Performed by: Valda Favia, CRNA Pre-anesthesia Checklist: Patient identified, Emergency Drugs available, Suction available, Patient being monitored and Timeout performed Patient Re-evaluated:Patient Re-evaluated prior to induction Oxygen Delivery Method: Circle system utilized Preoxygenation: Pre-oxygenation with 100% oxygen Induction Type: IV induction Ventilation: Mask ventilation without difficulty Laryngoscope Size: Mac and 4 Grade View: Grade II Tube type: Oral Tube size: 7.0 mm Number of attempts: 1 Airway Equipment and Method: Stylet Placement Confirmation: ETT inserted through vocal cords under direct vision,  positive ETCO2 and breath sounds checked- equal and bilateral Secured at: 21 cm Tube secured with: Tape Dental Injury: Teeth and Oropharynx as per pre-operative assessment

## 2017-03-08 LAB — CBC
HCT: 37.9 % (ref 36.0–46.0)
Hemoglobin: 12.5 g/dL (ref 12.0–15.0)
MCH: 30.9 pg (ref 26.0–34.0)
MCHC: 33 g/dL (ref 30.0–36.0)
MCV: 93.8 fL (ref 78.0–100.0)
Platelets: 287 10*3/uL (ref 150–400)
RBC: 4.04 MIL/uL (ref 3.87–5.11)
RDW: 13 % (ref 11.5–15.5)
WBC: 10.8 10*3/uL — ABNORMAL HIGH (ref 4.0–10.5)

## 2017-03-08 LAB — BASIC METABOLIC PANEL
Anion gap: 7 (ref 5–15)
BUN: 22 mg/dL — ABNORMAL HIGH (ref 6–20)
CO2: 27 mmol/L (ref 22–32)
Calcium: 8.3 mg/dL — ABNORMAL LOW (ref 8.9–10.3)
Chloride: 103 mmol/L (ref 101–111)
Creatinine, Ser: 0.95 mg/dL (ref 0.44–1.00)
GFR calc Af Amer: >60 mL/min (ref >60)
GFR calc non Af Amer: >60 mL/min (ref >60)
Glucose, Bld: 124 mg/dL — ABNORMAL HIGH (ref 65–99)
Potassium: 3.7 mmol/L (ref 3.5–5.1)
Sodium: 137 mmol/L (ref 135–145)

## 2017-03-08 MED ORDER — MORPHINE SULFATE (PF) 2 MG/ML IV SOLN: 2.0000 mg | INTRAVENOUS | Status: DC | PRN

## 2017-03-08 MED ORDER — OXYCODONE HCL 5 MG PO TABS: 5.0000 mg | ORAL_TABLET | ORAL | Status: DC | PRN

## 2017-03-08 MED ORDER — TIZANIDINE HCL 4 MG PO TABS: 4.0000 mg | ORAL_TABLET | Freq: Four times a day (QID) | ORAL | 0 refills | Status: DC | PRN

## 2017-03-08 MED ORDER — OXYCODONE HCL 5 MG PO TABS: 5.0000 mg | ORAL_TABLET | Freq: Four times a day (QID) | ORAL | 0 refills | Status: DC | PRN

## 2017-03-08 NOTE — Progress Notes (Signed)
Patient is waiting at room to [facility] before discharge.Notified to case Production designer, theatre/television/filmmanager.

## 2017-03-08 NOTE — Care Management Note (Addendum)
Case Management Note Original Note Created by Gae GallopAngela Cole  Patient Details  Name: Allison HousemanMaureen Reyes MRN: [id number] Date of Birth: 08/08/1960  Subjective/Objective:              Presents with intractable low back pain, hx of hypothyroidism, hyperlipidemia, hyperparathyroidism. From home with husband. PTA independent with ADL's, no DME usage.   Billey ChangJames Doering (Spouse)     [phone]          PCP: Doran HeaterElizabeth Cummings  Action/Plan: Pt with disc herniation....discetomy scheduled for today (11/9) .... CM will continue to follow for disposition needs.  Expected Discharge Date:  03/08/17               Expected Discharge Plan:  Home/Self Care(Resides with husband)  In-House Referral:     Discharge planning Services  CM Consult  Post Acute Care Choice:    Choice offered to:  Patient  DME Arranged:  Dan HumphreysWalker rolling DME Agency:  [facility]  [facility]  [facility]  Status of Service:  Completed, signed off  If discussed at Long Length of Stay Meetings, dates discussed:    Additional Comments: 03/08/2017  Therapy worked with pt post discharge order - recommending HH and DME.  Pt given choice - [facility] chosen, agency contacted and accepted referral.   CM requested orders via bedside nurse  Pt to discharge home with husband today. Pt has PCP and denied barriers to obtaining and paying for medications. No CM needs determined prior to discharge, discharge orders written, CM signing off [name], [name], RN 03/08/2017, 12:08 PM

## 2017-03-08 NOTE — Discharge Summary (Signed)
BP 129/63 (BP Location: Right Arm)   Pulse (!) 58   Temp 97.9 F (36.6 C) (Oral)   Resp 18   Ht 5\' 3"  (1.6 m)   Wt 110.2 kg (243 lb)   SpO2 97%   BMI 43.05 kg/m  Physician Discharge Summary  Patient ID: Allison HousemanMaureen Sweeney MRN: [id number] DOB/AGE: 56/09/1960 56 y.o.  Admit date: 03/04/2017 Discharge date: 03/08/2017  Admission Diagnoses:HNP left L4/5  Discharge Diagnoses: HNP left L4/5 Principal Problem:   Intractable low back pain Active Problems:   RESTLESS LEG SYNDROME   History of hyperparathyroidism   Hypothyroidism   Back pain   HNP (herniated nucleus pulposus), lumbar   Discharged Condition: good  Hospital Course: [name] was admitted for acute and severe low back pain on 02/27/2017. MRI revealed a large disc herniation at L4/5 on the left which corresponded with her symptoms. She was taken to the operating room on 03/07/1017 for a simple left L4/5 discetomy. Post op she is significantly better. Her wound is clean, dry, and without signs of infection. She is voiding, ambulating, and tolerating a regular diet. Strength is normal in the lower extremities. She will be discharged home.   Treatments: surgery: as above  Discharge Exam: Blood pressure 129/63, pulse (!) 58, temperature 97.9 F (36.6 C), temperature source Oral, resp. rate 18, height 5\' 3"  (1.6 m), weight 110.2 kg (243 lb), SpO2 97 %. General appearance: alert, cooperative, appears stated age and no distress Neurologic: Alert and oriented X 3, normal strength and tone. Normal symmetric reflexes. Normal coordination and gait  Disposition: 01-Home or Self Care HNP L4/5     Signed: Rosella Crandell L 03/08/2017, 8:36 AM

## 2017-03-08 NOTE — Evaluation (Signed)
Physical Therapy Evaluation Patient Details Name: Allison Reyes MRN: [id number] Reyes DOB: 10/16/1960 Today's Date: 03/08/2017   History of Present Illness  s/p Left semihemilaminectomy, left L4/5 discetomy  Clinical Impression  Pt was seen for evaluation of her mobility with RW and without, and noted safety issues of buckling LLE without.  Her plan is to get HHPT to follow up and address her strengthening needs and prompts with body mechanics, so that pt can continue on until MD releases her.  May need to follow up with outpatient eventually as she may not get full strength back on LLE.    Follow Up Recommendations Home health PT    Equipment Recommendations  Rolling walker with 5" wheels    Recommendations for Other Services       Precautions / Restrictions Precautions Precautions: Back Precaution Booklet Issued: Yes (comment) Required Braces or Orthoses: (no brace needed) Restrictions Weight Bearing Restrictions: No      Mobility  Bed Mobility Overal bed mobility: Needs Assistance Bed Mobility: Supine to Sit;Sit to Supine     Supine to sit: Supervision Sit to supine: Supervision(cued back precautions)   General bed mobility comments: instructed in log rolling technique  Transfers Overall transfer level: Needs assistance Equipment used: Rolling walker (2 wheeled);1 person hand held assist Transfers: Sit to/from Stand Sit to Stand: Supervision         General transfer comment: EOB  Ambulation/Gait Ambulation/Gait assistance: Min guard;Min assist(for safety with walker, min assist without walker ) Ambulation Distance (Feet): 80 Feet(40 x 2) Assistive device: Rolling walker (2 wheeled) Gait Pattern/deviations: Step-through pattern;Step-to pattern;Decreased stride length;Decreased stance time - left;Wide base of support Gait velocity: reduced Gait velocity interpretation: Below normal speed for age/gender General Gait Details: minor buckling with LLE x 2 without  walker  Stairs            Wheelchair Mobility    Modified Rankin (Stroke Patients Only)       Balance Overall balance assessment: Needs assistance Sitting-balance support: Feet supported Sitting balance-Leahy Scale: Good   Postural control: Posterior lean Standing balance support: Bilateral upper extremity supported;During functional activity Standing balance-Leahy Scale: Fair Standing balance comment: less than fair dynamically                             Pertinent Vitals/Pain Pain Assessment: Faces Faces Pain Scale: Hurts a little bit Pain Location: back Pain Descriptors / Indicators: Operative site guarding Pain Intervention(s): Monitored during session;Premedicated before session;Repositioned    Home Living Family/patient expects to be discharged to:: Private residence Living Arrangements: Spouse/significant other Available Help at Discharge: Family Type of Home: House Home Access: Stairs to enter Entrance Stairs-Rails: Right;Left;Can reach both Secretary/administratorntrance Stairs-Number of Steps: 1 Home Layout: One level Home Equipment: Shower seat;Grab bars - tub/shower Additional Comments: has not needed an AD    Prior Function Level of Independence: Independent               Hand Dominance        Extremity/Trunk Assessment   Upper Extremity Assessment Upper Extremity Assessment: Overall WFL for tasks assessed    Lower Extremity Assessment Lower Extremity Assessment: Generalized weakness    Cervical / Trunk Assessment Cervical / Trunk Assessment: Other exceptions(new lumbar hemilaminectomy, left L4/5 discetomy)  Communication   Communication: No difficulties  Cognition Arousal/Alertness: Awake/alert Behavior During Therapy: WFL for tasks assessed/performed Overall Cognitive Status: Within Functional Limits for tasks assessed  General Comments      Exercises Other Exercises Other  Exercises: hips 4+ add, abd 5   Assessment/Plan    PT Assessment Patient needs continued PT services  PT Problem List Decreased strength;Decreased range of motion;Decreased activity tolerance;Decreased balance;Decreased mobility;Decreased coordination;Decreased knowledge of use of DME;Decreased safety awareness;Obesity;Decreased skin integrity;Pain       PT Treatment Interventions DME instruction;Gait training;Functional mobility training;Therapeutic activities;Therapeutic exercise;Balance training;Neuromuscular re-education;Patient/family education    PT Goals (Current goals can be found in the Care Plan section)  Acute Rehab PT Goals Patient Stated Goal: to get a shower and get home PT Goal Formulation: With patient/family Time For Goal Achievement: 03/22/17 Potential to Achieve Goals: Good    Frequency Min 4X/week   Barriers to discharge Other (comment) has unstable gait    Co-evaluation               AM-PAC PT "6 Clicks" Daily Activity  Outcome Measure Difficulty turning over in bed (including adjusting bedclothes, sheets and blankets)?: A Little Difficulty moving from lying on back to sitting on the side of the bed? : A Little Difficulty sitting down on and standing up from a chair with arms (e.g., wheelchair, bedside commode, etc,.)?: A Little Help needed moving to and from a bed to chair (including a wheelchair)?: A Little Help needed walking in hospital room?: A Little Help needed climbing 3-5 steps with a railing? : A Little 6 Click Score: 18    End of Session Equipment Utilized During Treatment: Gait belt Activity Tolerance: Patient limited by fatigue Patient left: in bed;with call bell/phone within reach;with family/visitor present Nurse Communication: Mobility status PT Visit Diagnosis: Unsteadiness on feet (R26.81);Muscle weakness (generalized) (M62.81);Difficulty in walking, not elsewhere classified (R26.2);Pain Pain - Right/Left: Left Pain - part of  body: Hip    Time: [id number] PT Time Calculation (min) (ACUTE ONLY): 18 min   Charges:   PT Evaluation $PT Eval Moderate Complexity: 1 Mod     PT G Codes:   PT G-Codes **NOT FOR INPATIENT CLASS** Functional Assessment Tool Used: AM-PAC 6 Clicks Basic Mobility    Ivar DrapeRuth E Roshelle Traub 03/08/2017, 11:32 AM   Samul Dadauth Zeriyah Wain, PT MS Acute Rehab Dept. Number: [facility] [phone] and [facility] [phone]

## 2017-03-08 NOTE — Discharge Summary (Signed)
Physician Discharge Summary  Patient ID: Allison HousemanMaureen Reyes MRN: [id number] DOB/AGE: 56/09/1960 56 y.o.  Admit date: 03/04/2017 Discharge date: 03/08/2017  Admission Diagnoses:  Discharge Diagnoses:  Principal Problem:   Intractable low back pain Active Problems:   RESTLESS LEG SYNDROME   History of hyperparathyroidism   Hypothyroidism   Back pain   HNP (herniated nucleus pulposus), lumbar   Discharged Condition: good  Hospital Course: Please see Dr. Sueanne Margaritaabbell's note  Consults:   Significant Diagnostic Studies:   Treatments:   Discharge Exam: Blood pressure 129/63, pulse (!) 58, temperature 97.9 F (36.6 C), temperature source Oral, resp. rate 18, height 5\' 3"  (1.6 m), weight 110.2 kg (243 lb), SpO2 96 %.   Disposition: 01-Home or Self Care   Allergies as of 03/08/2017   No Known Allergies     Medication List    STOP taking these medications   HYDROcodone-homatropine 5-1.5 MG/5ML syrup Commonly known as:  HYCODAN     TAKE these medications   aspirin EC 81 MG tablet Take 1 tablet (81 mg total) by mouth daily.   atorvastatin 40 MG tablet Commonly known as:  LIPITOR Take 1 tablet (40 mg total) by mouth at bedtime.   benzonatate 200 MG capsule Commonly known as:  TESSALON Take 1 capsule (200 mg total) by mouth 3 (three) times daily as needed for cough.   buPROPion 150 MG 24 hr tablet Commonly known as:  WELLBUTRIN XL TAKE 1 TABLET(150 MG) BY MOUTH EVERY MORNING   calcitRIOL 0.5 MCG capsule Commonly known as:  ROCALTROL Take one capsule in the morning and take two capsules in the evenings. What changed:    how much to take  how to take this  when to take this  additional instructions   cetirizine 10 MG tablet Commonly known as:  ZYRTEC Take 1 tablet (10 mg total) by mouth daily.   citalopram 40 MG tablet Commonly known as:  CELEXA Take 1 tablet (40 mg total) by mouth daily.   esomeprazole 40 MG capsule Commonly known as:  NEXIUM TAKE 1  CAPSULE(40 MG) BY MOUTH DAILY.   levothyroxine 112 MCG tablet Commonly known as:  SYNTHROID, LEVOTHROID Take 1 tablet (112 mcg total) by mouth daily before breakfast.   oxyCODONE 5 MG immediate release tablet Commonly known as:  ROXICODONE Take 1 tablet (5 mg total) every 6 (six) hours as needed by mouth for severe pain.   REQUIP PO Take by mouth daily as needed (RLS).   tiZANidine 4 MG tablet Commonly known as:  ZANAFLEX Take 1 tablet (4 mg total) every 6 (six) hours as needed by mouth for muscle spasms.   zolpidem 10 MG tablet Commonly known as:  AMBIEN Take 1 tablet (10 mg total) by mouth at bedtime as needed.      Follow-up Information    Coletta Memosabbell, Kyle, MD Follow up in 3 week(s).   Specialty:  Neurosurgery Why:  please call the office to make an appointment.  Contact information: [address] [place] [address] [phone]           Signed: Temple PaciniOOL,Jandi Swiger A 03/08/2017, 11:33 AM

## 2017-03-08 NOTE — Progress Notes (Signed)
NURSING PROGRESS NOTE  Smiley HousemanMaureen Linder [id number] Discharge Data: 03/08/2017 12:16 PM Attending Provider: Coletta Memosabbell, Kyle, MD WUJ:[id number]:Cummings, Bernerd Phoharley Elizabeth, PA-C   Jaziya Vincelette to be D/C'd Home per MD order.    All IV's will be discontinued and monitored for bleeding.  All belongings will be returned to patient for patient to take home.  Last Documented Vital Signs:  Blood pressure 129/63, pulse (!) 58, temperature 97.9 F (36.6 C), temperature source Oral, resp. rate 18, height 5\' 3"  (1.6 m), weight 110.2 kg (243 lb), SpO2 96 %.  Monico BlitzJameela RN

## 2017-03-08 NOTE — Evaluation (Signed)
Occupational Therapy Evaluation Patient Details Name: Smiley HousemanMaureen Witte MRN: [id number] DOB: 01/19/1961 Today's Date: 03/08/2017    History of Present Illness s/p Left semihemilaminectomy   Clinical Impression   Pt admitted with the above diagnoses and presents with below problem list. PTA pt was independent with basic ADLs. Pt currently supervision for most ADLs. ADL strategies and back precaution education provided to pt and spouse. No further acute OT needs at this time. OT signing off.     Follow Up Recommendations  No OT follow up    Equipment Recommendations  None recommended by OT    Recommendations for Other Services       Precautions / Restrictions Precautions Precautions: Back Precaution Booklet Issued: Yes (comment) Required Braces or Orthoses: (no brace needed) Restrictions Weight Bearing Restrictions: No      Mobility Bed Mobility Overal bed mobility: Modified Independent             General bed mobility comments: instructed in log rolling technique  Transfers Overall transfer level: Needs assistance Equipment used: None Transfers: Sit to/from Stand Sit to Stand: Supervision         General transfer comment: from EOB and toilet.    Balance Overall balance assessment: Needs assistance         Standing balance support: No upper extremity supported Standing balance-Leahy Scale: Fair Standing balance comment: good static, fair dynamic                           ADL either performed or assessed with clinical judgement   ADL Overall ADL's : Needs assistance/impaired Eating/Feeding: Set up;Sitting   Grooming: Supervision/safety;Standing   Upper Body Bathing: Set up;Sitting   Lower Body Bathing: Supervison/ safety;Sit to/from stand   Upper Body Dressing : Set up;Sitting   Lower Body Dressing: Supervision/safety;Sit to/from stand   Toilet Transfer: Supervision/safety;Regular Toilet;Grab bars   Toileting- Designer, fashion/clothingClothing  Manipulation and Hygiene: Supervision/safety;Sit to/from stand   Tub/ Shower Transfer: Min guard;Walk-in shower;Ambulation;Shower seat   Functional mobility during ADLs: Supervision/safety General ADL Comments: Pt completed toilet transfer, pericare, bed mobility, and in-room functional mobility. Able to raise each foot to opposite knee for LB ADLs with back precautions. Spouse present. ADL education reviewed. Pt does at times seek external support of furniture during ambulation.      Vision         Perception     Praxis      Pertinent Vitals/Pain Pain Assessment: Faces Faces Pain Scale: Hurts a little bit Pain Location: back Pain Descriptors / Indicators: Aching;Sore Pain Intervention(s): Monitored during session;Repositioned     Hand Dominance     Extremity/Trunk Assessment Upper Extremity Assessment Upper Extremity Assessment: Overall WFL for tasks assessed   Lower Extremity Assessment Lower Extremity Assessment: Overall WFL for tasks assessed(reports some BLE weakness, h/o pain down LLE)       Communication Communication Communication: No difficulties   Cognition Arousal/Alertness: Awake/alert Behavior During Therapy: WFL for tasks assessed/performed Overall Cognitive Status: Within Functional Limits for tasks assessed                                     General Comments       Exercises     Shoulder Instructions      Home Living Family/patient expects to be discharged to:: Private residence Living Arrangements: Spouse/significant other Available Help at Discharge: Family Type of Home:  House Home Access: Stairs to enter Entergy CorporationEntrance Stairs-Number of Steps: 1   Home Layout: One level     Bathroom Shower/Tub: Producer, television/film/videoWalk-in shower   Bathroom Toilet: Standard     Home Equipment: Shower seat;Grab bars - tub/shower          Prior Functioning/Environment Level of Independence: Independent                 OT Problem List: Impaired  balance (sitting and/or standing);Decreased knowledge of use of DME or AE;Decreased knowledge of precautions;Pain      OT Treatment/Interventions:      OT Goals(Current goals can be found in the care plan section)    OT Frequency:     Barriers to D/C:            Co-evaluation              AM-PAC PT "6 Clicks" Daily Activity     Outcome Measure Help from another person eating meals?: None Help from another person taking care of personal grooming?: None Help from another person toileting, which includes using toliet, bedpan, or urinal?: None Help from another person bathing (including washing, rinsing, drying)?: A Little Help from another person to put on and taking off regular upper body clothing?: None Help from another person to put on and taking off regular lower body clothing?: None 6 Click Score: 23   End of Session    Activity Tolerance: Patient tolerated treatment well Patient left: in bed;with call bell/phone within reach;with family/visitor present;Other (comment)(EOB)  OT Visit Diagnosis: Unsteadiness on feet (R26.81);Pain                Time: [id number] OT Time Calculation (min): 20 min Charges:  OT General Charges $OT Visit: 1 Visit OT Evaluation $OT Eval Low Complexity: 1 Low G-Codes:       Pilar GrammesMathews, Deissy Guilbert H 03/08/2017, 9:36 AM

## 2017-03-09 DIAGNOSIS — E039 Hypothyroidism, unspecified: Secondary | ICD-10-CM | POA: Diagnosis not present

## 2017-03-09 DIAGNOSIS — N393 Stress incontinence (female) (male): Secondary | ICD-10-CM | POA: Diagnosis not present

## 2017-03-09 DIAGNOSIS — Z4789 Encounter for other orthopedic aftercare: Secondary | ICD-10-CM | POA: Diagnosis not present

## 2017-03-09 DIAGNOSIS — Z7982 Long term (current) use of aspirin: Secondary | ICD-10-CM | POA: Diagnosis not present

## 2017-03-09 DIAGNOSIS — F17210 Nicotine dependence, cigarettes, uncomplicated: Secondary | ICD-10-CM | POA: Diagnosis not present

## 2017-03-09 DIAGNOSIS — Z79891 Long term (current) use of opiate analgesic: Secondary | ICD-10-CM | POA: Diagnosis not present

## 2017-03-09 DIAGNOSIS — F329 Major depressive disorder, single episode, unspecified: Secondary | ICD-10-CM | POA: Diagnosis not present

## 2017-03-09 DIAGNOSIS — G2581 Restless legs syndrome: Secondary | ICD-10-CM | POA: Diagnosis not present

## 2017-03-09 DIAGNOSIS — E785 Hyperlipidemia, unspecified: Secondary | ICD-10-CM | POA: Diagnosis not present

## 2017-03-10 ENCOUNTER — Encounter (HOSPITAL_COMMUNITY): Payer: Self-pay | Admitting: Neurosurgery

## 2017-03-10 NOTE — Anesthesia Postprocedure Evaluation (Signed)
Anesthesia Post Note  Patient: Engineer, siteMaureen Soltys  Procedure(s) Performed: L4-5 Discectomy (Left )     Patient location during evaluation: PACU Anesthesia Type: General Level of consciousness: awake Pain management: pain level controlled Vital Signs Assessment: post-procedure vital signs reviewed and stable Respiratory status: spontaneous breathing Cardiovascular status: stable Postop Assessment: no apparent nausea or vomiting Anesthetic complications: no    Last Vitals:  Vitals:   03/08/17 0505 03/08/17 0830  BP: 129/63   Pulse: (!) 58   Resp: 18   Temp: 36.6 C   SpO2: 97% 96%    Last Pain:  Vitals:   03/08/17 0830  TempSrc:   PainSc: 1    Pain Goal: Patients Stated Pain Goal: 2 (03/08/17 0830)               Suzie Vandam JR,JOHN Susann GivensFRANKLIN

## 2017-03-12 DIAGNOSIS — E785 Hyperlipidemia, unspecified: Secondary | ICD-10-CM | POA: Diagnosis not present

## 2017-03-12 DIAGNOSIS — F17210 Nicotine dependence, cigarettes, uncomplicated: Secondary | ICD-10-CM | POA: Diagnosis not present

## 2017-03-12 DIAGNOSIS — N393 Stress incontinence (female) (male): Secondary | ICD-10-CM | POA: Diagnosis not present

## 2017-03-12 DIAGNOSIS — Z4789 Encounter for other orthopedic aftercare: Secondary | ICD-10-CM | POA: Diagnosis not present

## 2017-03-12 DIAGNOSIS — G2581 Restless legs syndrome: Secondary | ICD-10-CM | POA: Diagnosis not present

## 2017-03-12 DIAGNOSIS — Z7982 Long term (current) use of aspirin: Secondary | ICD-10-CM | POA: Diagnosis not present

## 2017-03-12 DIAGNOSIS — E039 Hypothyroidism, unspecified: Secondary | ICD-10-CM | POA: Diagnosis not present

## 2017-03-12 DIAGNOSIS — F329 Major depressive disorder, single episode, unspecified: Secondary | ICD-10-CM | POA: Diagnosis not present

## 2017-03-12 DIAGNOSIS — Z79891 Long term (current) use of opiate analgesic: Secondary | ICD-10-CM | POA: Diagnosis not present

## 2017-03-19 DIAGNOSIS — E039 Hypothyroidism, unspecified: Secondary | ICD-10-CM | POA: Diagnosis not present

## 2017-03-19 DIAGNOSIS — E785 Hyperlipidemia, unspecified: Secondary | ICD-10-CM | POA: Diagnosis not present

## 2017-03-19 DIAGNOSIS — F329 Major depressive disorder, single episode, unspecified: Secondary | ICD-10-CM | POA: Diagnosis not present

## 2017-03-19 DIAGNOSIS — Z7982 Long term (current) use of aspirin: Secondary | ICD-10-CM | POA: Diagnosis not present

## 2017-03-19 DIAGNOSIS — N393 Stress incontinence (female) (male): Secondary | ICD-10-CM | POA: Diagnosis not present

## 2017-03-19 DIAGNOSIS — Z4789 Encounter for other orthopedic aftercare: Secondary | ICD-10-CM | POA: Diagnosis not present

## 2017-03-19 DIAGNOSIS — Z79891 Long term (current) use of opiate analgesic: Secondary | ICD-10-CM | POA: Diagnosis not present

## 2017-03-19 DIAGNOSIS — G2581 Restless legs syndrome: Secondary | ICD-10-CM | POA: Diagnosis not present

## 2017-03-19 DIAGNOSIS — F17210 Nicotine dependence, cigarettes, uncomplicated: Secondary | ICD-10-CM | POA: Diagnosis not present

## 2017-03-26 ENCOUNTER — Emergency Department: Admission: EM | Admit: 2017-03-26 | Discharge: 2017-03-26 | Discharge status: Absent without leave | Payer: Self-pay

## 2017-03-26 ENCOUNTER — Encounter: Payer: Self-pay | Admitting: Family Medicine

## 2017-03-26 ENCOUNTER — Ambulatory Visit (INDEPENDENT_AMBULATORY_CARE_PROVIDER_SITE_OTHER): Payer: BLUE CROSS/BLUE SHIELD | Admitting: Family Medicine

## 2017-03-26 VITALS — BP 123/86 | HR 87 | Temp 97.9°F | Wt 237.0 lb

## 2017-03-26 DIAGNOSIS — J029 Acute pharyngitis, unspecified: Secondary | ICD-10-CM | POA: Diagnosis not present

## 2017-03-26 DIAGNOSIS — R300 Dysuria: Secondary | ICD-10-CM

## 2017-03-26 LAB — POCT URINALYSIS DIPSTICK
Bilirubin, UA: NEGATIVE
Glucose, UA: NEGATIVE
Ketones, UA: NEGATIVE
Nitrite, UA: NEGATIVE
Protein, UA: NEGATIVE
Spec Grav, UA: 1.015 (ref 1.010–1.025)
Urobilinogen, UA: 0.2 E.U./dL
pH, UA: 6 (ref 5.0–8.0)

## 2017-03-26 MED ORDER — CEFDINIR 300 MG PO CAPS: 300.0000 mg | ORAL_CAPSULE | Freq: Two times a day (BID) | ORAL | 0 refills | Status: DC

## 2017-03-26 MED ORDER — FLUCONAZOLE 150 MG PO TABS: 150.0000 mg | ORAL_TABLET | Freq: Once | ORAL | 1 refills | Status: AC

## 2017-03-26 MED ORDER — IPRATROPIUM BROMIDE 0.06 % NA SOLN: 2.0000 | NASAL | 6 refills | Status: DC | PRN

## 2017-03-26 NOTE — Patient Instructions (Addendum)
Thank you for coming in today. Continue tylenol or ibuprofen . Take tessalon during the day.  Use your pain medicine at bedtime for cough.  Use the atorvent nasal spray.  Recheck if not better. Next step would be steroids.  Take omnicef twice daily for UTI.  Take fluconazole if you get a yeast infection.   Recheck as needed.   Pharyngitis Pharyngitis is redness, pain, and swelling (inflammation) of your pharynx. What are the causes? Pharyngitis is usually caused by infection. Most of the time, these infections are from viruses (viral) and are part of a cold. However, sometimes pharyngitis is caused by bacteria (bacterial). Pharyngitis can also be caused by allergies. Viral pharyngitis may be spread from person to person by coughing, sneezing, and personal items or utensils (cups, forks, spoons, toothbrushes). Bacterial pharyngitis may be spread from person to person by more intimate contact, such as kissing. What are the signs or symptoms? Symptoms of pharyngitis include:  Sore throat.  Tiredness (fatigue).  Low-grade fever.  Headache.  Joint pain and muscle aches.  Skin rashes.  Swollen lymph nodes.  Plaque-like film on throat or tonsils (often seen with bacterial pharyngitis).  How is this diagnosed? Your health care provider will ask you questions about your illness and your symptoms. Your medical history, along with a physical exam, is often all that is needed to diagnose pharyngitis. Sometimes, a rapid strep test is done. Other lab tests may also be done, depending on the suspected cause. How is this treated? Viral pharyngitis will usually get better in 3-4 days without the use of medicine. Bacterial pharyngitis is treated with medicines that kill germs (antibiotics). Follow these instructions at home:  Drink enough water and fluids to keep your urine clear or pale yellow.  Only take over-the-counter or prescription medicines as directed by your health care  provider: ? If you are prescribed antibiotics, make sure you finish them even if you start to feel better. ? Do not take aspirin.  Get lots of rest.  Gargle with 8 oz of salt water ( tsp of salt per 1 qt of water) as often as every 1-2 hours to soothe your throat.  Throat lozenges (if you are not at risk for choking) or sprays may be used to soothe your throat. Contact a health care provider if:  You have large, tender lumps in your neck.  You have a rash.  You cough up green, yellow-brown, or bloody spit. Get help right away if:  Your neck becomes stiff.  You drool or are unable to swallow liquids.  You vomit or are unable to keep medicines or liquids down.  You have severe pain that does not go away with the use of recommended medicines.  You have trouble breathing (not caused by a stuffy nose). This information is not intended to replace advice given to you by your health care provider. Make sure you discuss any questions you have with your health care provider. Document Released: 04/15/2005 Document Revised: 09/21/2015 Document Reviewed: 12/21/2012 Elsevier Interactive Patient Education  2017 [employer]

## 2017-03-26 NOTE — Progress Notes (Signed)
Allison Reyes is a 56 y.o. female who [facility]: Primary Care Sports Medicine today for sore throat and congestion.  Symptoms present for several days.  Patient denies any fevers or chills vomiting or diarrhea.  She is tried some over-the-counter medicines for cough which have helped.  She notes the [employer] Perles have not helped.  She recently had a back surgery requiring general anesthesia about 2-1/2 weeks ago.  She notes the pain medicines prescribed helpful for cough at night.  Additionally patient notes dysuria.  This is been ongoing for about a week and is consistent with prior UTI.  She had a catheter placed as part of her anesthesia for back surgery.  He denies any fevers or chills or blood in the urine.   Past Medical History:  Diagnosis Date  . Depression   . GERD (gastroesophageal reflux disease)   . Hyperlipidemia   . Hyperparathyroidism   . Osteopenia   . Stress incontinence    Past Surgical History:  Procedure Laterality Date  . ABDOMINAL HYSTERECTOMY     TAH - endometriosis  . LAPAROSCOPIC ENDOMETRIOSIS FULGURATION    . LUMBAR LAMINECTOMY/DECOMPRESSION MICRODISCECTOMY Left 03/07/2017   Procedure: L4-5 Discectomy;  Surgeon: Coletta Memosabbell, Kyle, MD;  [facility];  Service: Neurosurgery;  Laterality: Left;  . NASAL SINUS SURGERY  01/03/11  . OOPHORECTOMY    . THYROIDECTOMY, PARTIAL     Social History   Tobacco Use  . Smoking status: Light Tobacco Smoker    Types: Cigarettes  . Smokeless tobacco: Never Used  . Tobacco comment: 7 cigarettes daily.  Substance Use Topics  . Alcohol use: Yes    Comment: social-wine   family history includes Alcohol abuse in her mother; Coronary artery disease in her mother; Depression in her mother; Hypertension in her father and mother; Lung cancer in her father; Stroke in her paternal grandfather.  ROS as  above:  Medications: Current Outpatient Medications  Medication Sig Dispense Refill  . aspirin EC 81 MG tablet Take 1 tablet (81 mg total) by mouth daily. 90 tablet 3  . atorvastatin (LIPITOR) 40 MG tablet Take 1 tablet (40 mg total) by mouth at bedtime. 90 tablet 2  . benzonatate (TESSALON) 200 MG capsule Take 1 capsule (200 mg total) by mouth 3 (three) times daily as needed for cough. 45 capsule 0  . buPROPion (WELLBUTRIN XL) 150 MG 24 hr tablet TAKE 1 TABLET(150 MG) BY MOUTH EVERY MORNING 90 tablet 0  . calcitRIOL (ROCALTROL) 0.5 MCG capsule Take one capsule in the morning and take two capsules in the evenings. (Patient taking differently: Take 0.5-1 mcg See admin instructions by mouth. Take one capsule (0.575mcg) in the morning and take two capsules (1mcg) in the evenings.) 270 capsule 3  . cetirizine (ZYRTEC) 10 MG tablet Take 1 tablet (10 mg total) by mouth daily. 90 tablet 3  . citalopram (CELEXA) 40 MG tablet Take 1 tablet (40 mg total) by mouth daily. 90 tablet 3  . esomeprazole (NEXIUM) 40 MG capsule TAKE 1 CAPSULE(40 MG) BY MOUTH DAILY. 90 capsule 3  . levothyroxine (SYNTHROID, LEVOTHROID) 112 MCG tablet Take 1 tablet (112 mcg total) by mouth daily before breakfast. 30 tablet 3  . oxyCODONE (ROXICODONE) 5 MG immediate release tablet Take 1 tablet (5 mg total) every 6 (six) hours as needed by mouth for severe pain. 30 tablet 0  . ROPINIRole HCl (REQUIP PO) Take by mouth daily as needed (RLS).    .Marland Kitchen  tiZANidine (ZANAFLEX) 4 MG tablet Take 1 tablet (4 mg total) every 6 (six) hours as needed by mouth for muscle spasms. 60 tablet 0  . zolpidem (AMBIEN) 10 MG tablet Take 1 tablet (10 mg total) by mouth at bedtime as needed. 45 tablet 0  . cefdinir (OMNICEF) 300 MG capsule Take 1 capsule (300 mg total) by mouth 2 (two) times daily. 14 capsule 0  . fluconazole (DIFLUCAN) 150 MG tablet Take 1 tablet (150 mg total) by mouth once for 1 dose. 1 tablet 1  . ipratropium (ATROVENT) 0.06 % nasal spray  Place 2 sprays into both nostrils every 4 (four) hours as needed for rhinitis. 10 mL 6   No current facility-administered medications for this visit.    No Known Allergies  Health Maintenance Health Maintenance  Topic Date Due  . MAMMOGRAM  04/16/2018  . DEXA SCAN  11/27/2018  . Fecal DNA (Cologuard)  06/06/2019  . TETANUS/TDAP  08/12/2021  . INFLUENZA VACCINE  Completed  . Hepatitis C Screening  Completed  . HIV Screening  Completed     Exam:  BP 123/86   Pulse 87   Temp 97.9 F (36.6 C) (Oral)   Wt 237 lb (107.5 kg)   BMI 41.98 kg/m  Gen: Well NAD HEENT: EOMI,  MMM posterior pharynx with cobblestoning.  Clear nasal discharge. Lungs: Normal work of breathing. CTABL Heart: RRR no MRG Abd: NABS, Soft. Nondistended, Nontender no CVA no tenderness to percussion Exts: Brisk capillary refill, warm and well perfused.    Results for orders placed or performed in visit on 03/26/17 (from the past 72 hour(s))  POCT Urinalysis Dipstick     Status: Abnormal   Collection Time: 03/26/17 11:19 AM  Result Value Ref Range   Color, UA yellow    Clarity, UA clear    Glucose, UA negative    Bilirubin, UA 'negative    Ketones, UA negative    Spec Grav, UA 1.015 1.010 - 1.025   Blood, UA moderate    pH, UA 6.0 5.0 - 8.0   Protein, UA negative    Urobilinogen, UA 0.2 0.2 or 1.0 E.U./dL   Nitrite, UA negative    Leukocytes, UA Small (1+) (A) Negative   No results found.    Assessment and Plan: 56 y.o. female with  Cough and viral URI likely.  Doubtful for pneumonia.  Will treat with Atrovent nasal spray and over-the-counter medicines for cold and cough.  Use pain medications already prescribed as needed for cough at nighttime.  Dysuria likely urinary tract infection.  Suspicious for hospital-acquired.  Plan to obtain a urine culture and treat empirically with Omnicef.  Use fluconazole in case patient develops a yeast infection was has happened in the past.   Orders Placed This  Encounter  Procedures  . Urine Culture  . POCT Urinalysis Dipstick   Meds ordered this encounter  Medications  . ipratropium (ATROVENT) 0.06 % nasal spray    Sig: Place 2 sprays into both nostrils every 4 (four) hours as needed for rhinitis.    Dispense:  10 mL    Refill:  6  . cefdinir (OMNICEF) 300 MG capsule    Sig: Take 1 capsule (300 mg total) by mouth 2 (two) times daily.    Dispense:  14 capsule    Refill:  0  . fluconazole (DIFLUCAN) 150 MG tablet    Sig: Take 1 tablet (150 mg total) by mouth once for 1 dose.    Dispense:  1 tablet    Refill:  1     Discussed warning signs or symptoms. Please see discharge instructions. Patient expresses understanding.

## 2017-03-31 DIAGNOSIS — R300 Dysuria: Secondary | ICD-10-CM | POA: Diagnosis not present

## 2017-04-01 LAB — URINE CULTURE
MICRO NUMBER:: 81356504
SPECIMEN QUALITY:: ADEQUATE

## 2017-05-15 ENCOUNTER — Other Ambulatory Visit: Payer: Self-pay | Admitting: Physician Assistant

## 2017-05-15 DIAGNOSIS — F332 Major depressive disorder, recurrent severe without psychotic features: Secondary | ICD-10-CM

## 2017-06-13 ENCOUNTER — Other Ambulatory Visit: Payer: Self-pay | Admitting: Physician Assistant

## 2017-06-13 DIAGNOSIS — F332 Major depressive disorder, recurrent severe without psychotic features: Secondary | ICD-10-CM

## 2017-07-02 ENCOUNTER — Other Ambulatory Visit: Payer: Self-pay | Admitting: Physician Assistant

## 2017-07-02 DIAGNOSIS — F332 Major depressive disorder, recurrent severe without psychotic features: Secondary | ICD-10-CM

## 2017-10-06 ENCOUNTER — Other Ambulatory Visit: Payer: Self-pay | Admitting: Physician Assistant

## 2017-10-06 DIAGNOSIS — E890 Postprocedural hypothyroidism: Secondary | ICD-10-CM

## 2017-10-24 ENCOUNTER — Ambulatory Visit (INDEPENDENT_AMBULATORY_CARE_PROVIDER_SITE_OTHER): Payer: BLUE CROSS/BLUE SHIELD

## 2017-10-24 DIAGNOSIS — Z1231 Encounter for screening mammogram for malignant neoplasm of breast: Secondary | ICD-10-CM

## 2017-11-11 ENCOUNTER — Ambulatory Visit (INDEPENDENT_AMBULATORY_CARE_PROVIDER_SITE_OTHER): Payer: BLUE CROSS/BLUE SHIELD | Admitting: Obstetrics & Gynecology

## 2017-11-11 ENCOUNTER — Encounter: Payer: Self-pay | Admitting: Obstetrics & Gynecology

## 2017-11-11 VITALS — BP 122/79 | HR 87 | Resp 16 | Ht 63.0 in | Wt 248.0 lb

## 2017-11-11 DIAGNOSIS — Z01419 Encounter for gynecological examination (general) (routine) without abnormal findings: Secondary | ICD-10-CM

## 2017-11-11 DIAGNOSIS — R32 Unspecified urinary incontinence: Secondary | ICD-10-CM

## 2017-11-11 MED ORDER — ESTRADIOL 1 MG PO TABS: 1.0000 mg | ORAL_TABLET | Freq: Every day | ORAL | 12 refills | Status: DC

## 2017-11-11 NOTE — Progress Notes (Signed)
Subjective:    Allison Reyes is a 57 y.o. married P3 (19, 6027, and [age] yo kids, 3 grands) female who presents for an annual exam. She reports lots of head sweating, especially at night. She also reports very low energy. She took ERT after her hysterectomy, BSO for a few years, about 15 years ago. She also complains of urinary incontinence. The patient is sexually active. GYN screening history: last pap: was normal. The patient wears seatbelts: yes. The patient participates in regular exercise: no. Has the patient ever been transfused or tattooed?: no. The patient reports that there is not domestic violence in her life.   Menstrual History: OB History    Gravida  4   Para  3   Term  3   Preterm      AB  1   Living        SAB  1   TAB      Ectopic      Multiple      Live Births              Menarche age: [age] No LMP recorded. Patient has had a hysterectomy.    The following portions of the patient's history were reviewed and updated as appropriate: allergies, current medications, past family history, past medical history, past social history, past surgical history and problem list.  Review of Systems Pertinent items are noted in HPI.   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   Objective:    BP 122/79   Pulse 87   Resp 16   Ht 5\' 3"  (1.6 m)   Wt 248 lb (112.5 kg)   BMI 43.93 kg/m   General Appearance:    Alert, cooperative, no distress, appears stated age  Head:    Normocephalic, without obvious abnormality, atraumatic  Eyes:    PERRL, conjunctiva/corneas clear, EOM's intact, fundi    benign, both eyes  Ears:    Normal TM's and external ear canals, both ears  Nose:   Nares normal, septum midline, mucosa normal, no drainage    or sinus tenderness  Throat:   Lips, mucosa, and tongue normal; teeth and gums normal  Neck:   Supple, symmetrical, trachea midline, no  adenopathy;    thyroid:  no enlargement/tenderness/nodules; no carotid   bruit or JVD  Back:     Symmetric, no curvature, ROM normal, no CVA tenderness  Lungs:     Clear to auscultation bilaterally, respirations unlabored  Chest Wall:    No tenderness or deformity   Heart:    Regular rate and rhythm, S1 and S2 normal, no murmur, rub   or gallop  Breast Exam:    No tenderness, masses, or nipple abnormality  Abdomen:     Soft, non-tender, bowel sounds active all four quadrants,    no masses, no organomegaly, morbidly obese  Genitalia:    Normal female without lesion, discharge or tenderness, no masses palpable and no tenderness with bimanual exam     Extremities:   Extremities normal, atraumatic, no cyanosis or edema  Pulses:   2+ and symmetric all extremities  Skin:   Skin color, texture, turgor normal, no rashes or lesions  Lymph nodes:   Cervical, supraclavicular, and axillary nodes normal  Neurologic:   CNII-XII intact, normal strength, sensation and reflexes    throughout  .    Assessment:    Healthy female exam.  Plan:     urology referral   Estradiol 1 mg  Come back 4 weeks

## 2017-11-20 ENCOUNTER — Other Ambulatory Visit: Payer: Self-pay | Admitting: Physician Assistant

## 2017-11-20 DIAGNOSIS — K219 Gastro-esophageal reflux disease without esophagitis: Secondary | ICD-10-CM

## 2017-11-20 DIAGNOSIS — Z8639 Personal history of other endocrine, nutritional and metabolic disease: Secondary | ICD-10-CM

## 2017-11-20 DIAGNOSIS — E782 Mixed hyperlipidemia: Secondary | ICD-10-CM

## 2017-11-20 DIAGNOSIS — J309 Allergic rhinitis, unspecified: Secondary | ICD-10-CM

## 2017-11-20 DIAGNOSIS — F332 Major depressive disorder, recurrent severe without psychotic features: Secondary | ICD-10-CM

## 2017-11-20 DIAGNOSIS — M8580 Other specified disorders of bone density and structure, unspecified site: Secondary | ICD-10-CM

## 2017-11-25 ENCOUNTER — Encounter: Payer: Self-pay | Admitting: Physician Assistant

## 2017-11-25 ENCOUNTER — Ambulatory Visit: Payer: BLUE CROSS/BLUE SHIELD | Admitting: Physician Assistant

## 2017-11-25 VITALS — BP 125/83 | HR 83 | Wt 247.0 lb

## 2017-11-25 DIAGNOSIS — J329 Chronic sinusitis, unspecified: Secondary | ICD-10-CM

## 2017-11-25 DIAGNOSIS — F332 Major depressive disorder, recurrent severe without psychotic features: Secondary | ICD-10-CM | POA: Diagnosis not present

## 2017-11-25 DIAGNOSIS — G4710 Hypersomnia, unspecified: Secondary | ICD-10-CM

## 2017-11-25 DIAGNOSIS — E890 Postprocedural hypothyroidism: Secondary | ICD-10-CM

## 2017-11-25 DIAGNOSIS — Z5181 Encounter for therapeutic drug level monitoring: Secondary | ICD-10-CM

## 2017-11-25 DIAGNOSIS — Z9189 Other specified personal risk factors, not elsewhere classified: Secondary | ICD-10-CM

## 2017-11-25 DIAGNOSIS — E782 Mixed hyperlipidemia: Secondary | ICD-10-CM | POA: Diagnosis not present

## 2017-11-25 DIAGNOSIS — J309 Allergic rhinitis, unspecified: Secondary | ICD-10-CM

## 2017-11-25 DIAGNOSIS — M8580 Other specified disorders of bone density and structure, unspecified site: Secondary | ICD-10-CM

## 2017-11-25 DIAGNOSIS — J3089 Other allergic rhinitis: Secondary | ICD-10-CM

## 2017-11-25 DIAGNOSIS — Z8639 Personal history of other endocrine, nutritional and metabolic disease: Secondary | ICD-10-CM | POA: Diagnosis not present

## 2017-11-25 DIAGNOSIS — R499 Unspecified voice and resonance disorder: Secondary | ICD-10-CM | POA: Insufficient documentation

## 2017-11-25 DIAGNOSIS — K219 Gastro-esophageal reflux disease without esophagitis: Secondary | ICD-10-CM

## 2017-11-25 MED ORDER — ESOMEPRAZOLE MAGNESIUM 20 MG PO CPDR: DELAYED_RELEASE_CAPSULE | ORAL | 1 refills | Status: DC

## 2017-11-25 MED ORDER — ATORVASTATIN CALCIUM 40 MG PO TABS: 40.0000 mg | ORAL_TABLET | Freq: Every day | ORAL | 1 refills | Status: DC

## 2017-11-25 MED ORDER — MONTELUKAST SODIUM 10 MG PO TABS: 10.0000 mg | ORAL_TABLET | Freq: Every day | ORAL | 1 refills | Status: DC

## 2017-11-25 MED ORDER — CITALOPRAM HYDROBROMIDE 40 MG PO TABS: 40.0000 mg | ORAL_TABLET | Freq: Every day | ORAL | 1 refills | Status: DC

## 2017-11-25 MED ORDER — CALCITRIOL 0.5 MCG PO CAPS: 1.5000 ug | ORAL_CAPSULE | Freq: Every day | ORAL | 1 refills | Status: DC

## 2017-11-25 MED ORDER — LEVOCETIRIZINE DIHYDROCHLORIDE 5 MG PO TABS: 5.0000 mg | ORAL_TABLET | Freq: Every evening | ORAL | 1 refills | Status: DC

## 2017-11-25 MED ORDER — BUPROPION HCL ER (XL) 150 MG PO TB24: 150.0000 mg | ORAL_TABLET | Freq: Every day | ORAL | 1 refills | Status: DC

## 2017-11-25 MED ORDER — PREDNISONE 50 MG PO TABS: 50.0000 mg | ORAL_TABLET | Freq: Every day | ORAL | 0 refills | Status: DC

## 2017-11-25 MED ORDER — AMOXICILLIN-POT CLAVULANATE 875-125 MG PO TABS: 1.0000 | ORAL_TABLET | Freq: Two times a day (BID) | ORAL | 0 refills | Status: AC

## 2017-11-25 NOTE — Progress Notes (Signed)
HPI:                                                                Allison Reyes is a 57 y.o. female who presents to [facility] Allison Reyes: Primary Care Sports Medicine today for medication management  Reports for the last 4-6 weeks she has had severe hypersomnolence, feeling tired when waking up despite adequate sleep. Sleeping 9 hours nightly during the week and napping 4 hours on the weekends.  She self-discontinued her Wellbutrin approx 4 months ago and has noticed recurrence of depressive symptoms including depressed mood, anhedonia, increased appetite, weight gain, concentration difficulty and fatigue. She is still taking Celexa 40 mg.  She also c/o chronic nasal congestion, purulent nasal drainage, sinus pressure, throat feeling dry, non-productive cough, loud snoring, and  hoarseness. She has noticed in the last year her voice has deepened a full octave. 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 Continues to smoke 4-5 cigarettes per day. She currently takes Zyrtec daily and Flonase prn. Reports intolerance to nasal sprays.   She had back surgery in November 2018 with [facility]. Takes Oxycodone rarely as needed for severe pain.  She was recently started on Estrace by GYN for vasomotor symptoms. Hx of total hysterectomy for endometriosis age 57. She was on HRT for 1 year post-op, but this was discontinued. She is UTD on her mammogram.  [facility] 11/25/2017 02/14/2017 11/06/2016 04/15/2016  Decreased Interest 2 0 2 2  Down, Depressed, Hopeless 2 0 2 2  PHQ - 2 Score 4 0 4 4  Altered sleeping 3 1 3 3   Tired, decreased energy 3 2 3 3   Change in appetite 3 2 3 3   Feeling bad or failure about yourself  2 1 3 3   Trouble concentrating 2 0 2 2  Moving slowly or fidgety/restless 3 2 1  0  Suicidal thoughts 0 0 1 0  PHQ-9 Score 20 8 20 18   Difficult doing work/chores Very difficult - - -    GAD 7 : Generalized Anxiety Score  11/25/2017 02/14/2017 11/06/2016  Nervous, Anxious, on Edge 1 0 2  Control/stop worrying 0 0 2  Worry too much - different things 0 0 3  Trouble relaxing 2 0 1  Restless 2 1 1   Easily annoyed or irritable 1 0 2  Afraid - awful might happen 0 0 3  Total GAD 7 Score 6 1 14   Anxiety Difficulty Not difficult at all - -      Past Medical History:  Diagnosis Date  . Depression   . GERD (gastroesophageal reflux disease)   . Hyperlipidemia   . Hyperparathyroidism   . Osteopenia   . Stress incontinence    Past Surgical History:  Procedure Laterality Date  . ABDOMINAL HYSTERECTOMY     TAH - endometriosis  . LAPAROSCOPIC ENDOMETRIOSIS FULGURATION    . LAPAROTOMY    . LUMBAR LAMINECTOMY/DECOMPRESSION MICRODISCECTOMY Left 03/07/2017   Procedure: L4-5 Discectomy;  Surgeon: Coletta Memosabbell, Kyle, MD;  [facility];  Service: Neurosurgery;  Laterality: Left;  . NASAL SINUS SURGERY  01/03/11  . OOPHORECTOMY    . THYROIDECTOMY, PARTIAL     Social History   Tobacco Use  . Smoking status: Light Tobacco  Smoker    Types: Cigarettes  . Smokeless tobacco: Never Used  . Tobacco comment: 7 cigarettes daily.  Substance Use Topics  . Alcohol use: Yes    Comment: social-wine   family history includes Alcohol abuse in her mother; Coronary artery disease in her mother; Depression in her mother; Hypertension in her father and mother; Lung cancer in her father; Stroke in her paternal grandfather.    ROS: negative except as noted in the HPI  Medications: Current Outpatient Medications  Medication Sig Dispense Refill  . aspirin EC 81 MG tablet Take 1 tablet (81 mg total) by mouth daily. 90 tablet 3  . atorvastatin (LIPITOR) 40 MG tablet Take 1 tablet (40 mg total) by mouth at bedtime. 90 tablet 1  . buPROPion (WELLBUTRIN XL) 150 MG 24 hr tablet Take 1 tablet (150 mg total) by mouth daily. 90 tablet 1  . calcitRIOL (ROCALTROL) 0.5 MCG capsule Take 3 capsules (1.5 mcg total) by mouth daily. 270 capsule 1   . cetirizine (ZYRTEC) 10 MG tablet Take 1 tablet (10 mg total) by mouth daily. 90 tablet 3  . citalopram (CELEXA) 40 MG tablet Take 1 tablet (40 mg total) by mouth daily. 90 tablet 1  . esomeprazole (NEXIUM) 20 MG capsule TAKE 1 CAPSULE(40 MG) BY MOUTH DAILY. 90 capsule 1  . estradiol (ESTRACE) 1 MG tablet Take 1 tablet (1 mg total) by mouth daily. 30 tablet 12  . levothyroxine (SYNTHROID, LEVOTHROID) 112 MCG tablet TAKE 1 TABLET(112 MCG) BY MOUTH DAILY BEFORE BREAKFAST 90 tablet 0  . ROPINIRole HCl (REQUIP PO) Take by mouth daily as needed (RLS).    Marland Kitchen tiZANidine (ZANAFLEX) 4 MG tablet Take 1 tablet (4 mg total) every 6 (six) hours as needed by mouth for muscle spasms. 60 tablet 0  . zolpidem (AMBIEN) 10 MG tablet Take 1 tablet (10 mg total) by mouth at bedtime as needed. 45 tablet 0  . amoxicillin-clavulanate (AUGMENTIN) 875-125 MG tablet Take 1 tablet by mouth 2 (two) times daily for 10 days. 20 tablet 0  . levocetirizine (XYZAL ALLERGY 24HR) 5 MG tablet Take 1 tablet (5 mg total) by mouth every evening. 90 tablet 1  . montelukast (SINGULAIR) 10 MG tablet Take 1 tablet (10 mg total) by mouth at bedtime. 90 tablet 1  . predniSONE (DELTASONE) 50 MG tablet Take 1 tablet (50 mg total) by mouth daily. 5 tablet 0   No current facility-administered medications for this visit.    No Known Allergies     Objective:  BP 125/83   Pulse 83   Wt 247 lb (112 kg)   BMI 43.75 kg/m  Gen:  alert, not ill-appearing, no distress, appropriate for age, obese female HEENT: head normocephalic without obvious abnormality, conjunctiva and cornea clear, wearing glasses, TM's pearly gray and semi-transparent, nasal mucosa edematous, oropharynx clear, there is cobblestoning of the posterior third of the tongue, no exudates, uvula midline, neck supple, trachea midline Pulm: Normal work of breathing, normal phonation, clear to auscultation bilaterally, no wheezes, rales or rhonchi CV: Normal rate, regular rhythm,  s1 and s2 distinct, no murmurs, clicks or rubs  Neuro: alert and oriented x 3, no tremor MSK: extremities atraumatic, normal gait and station Skin: intact, no rashes on exposed skin, no jaundice, no cyanosis Psych: well-groomed, cooperative, good eye contact, euthymic mood, affect mood-congruent, speech is articulate, and thought processes clear and goal-directed    No results found for this or any previous visit (from the past 72 hour(s)). No results  found.    Assessment and Plan: 57 y.o. female with   Encounter for medication monitoring - Plan: CBC, Comprehensive metabolic panel, Lipid Panel w/reflex Direct LDL, PTH, intact and calcium, TSH, VITAMIN D 25 Hydroxy (Vit-D Deficiency, Fractures), Hemoglobin A1c, Vitamin B12  Mixed hyperlipidemia - Plan: Lipid Panel w/reflex Direct LDL, atorvastatin (LIPITOR) 40 MG tablet  Severe episode of recurrent major depressive disorder, without psychotic features (HCC) - Plan: buPROPion (WELLBUTRIN XL) 150 MG 24 hr tablet, citalopram (CELEXA) 40 MG tablet  History of hyperparathyroidism - Plan: calcitRIOL (ROCALTROL) 0.5 MCG capsule, PTH, intact and calcium  Osteopenia, unspecified location - Plan: calcitRIOL (ROCALTROL) 0.5 MCG capsule, VITAMIN D 25 Hydroxy (Vit-D Deficiency, Fractures)  Chronic allergic rhinitis  Gastroesophageal reflux disease, esophagitis presence not specified - Plan: esomeprazole (NEXIUM) 20 MG capsule  Postsurgical hypothyroidism - Plan: PTH, intact and calcium, TSH  Mixed hyperlipidemia - on statin therapy10 yr ASCVD risk 5.6% - Plan: Lipid Panel w/reflex Direct LDL, atorvastatin (LIPITOR) 40 MG tablet  Chronic sinusitis, unspecified location - Plan: amoxicillin-clavulanate (AUGMENTIN) 875-125 MG tablet, predniSONE (DELTASONE) 50 MG tablet, Ambulatory referral to ENT  Perennial allergic rhinitis - Plan: levocetirizine (XYZAL ALLERGY 24HR) 5 MG tablet, montelukast (SINGULAIR) 10 MG tablet  Change in voice - Plan:  Ambulatory referral to ENT  Risk factors for obstructive sleep apnea  MDD, Hypersomnolence PHQ9=20, moderately severe, no acute safety issues Re-starting Wellbutrin XL 150 mg daily in addition to Celexa 40 mg Will defer sleep study for today and treat depression, but she has a positive STOPBANG and hypersomnolence may be 2/2 to untreated OSA Close follow-up in 4-6 weeks  Sinusitis/Rhinitis/Voice Change Will treat for subacute sinusitis with Augmentin x 10 days and Prednisone burst x 5 days Continue nasal toileting with saline irrigation and Flonase as tolerated Switching to Xyzal and adding Singulair Referring to ENT to voice change and tobacco use  Patient education and anticipatory guidance given Patient agrees with treatment plan Follow-up IN 4-6 weeks or sooner as needed if symptoms worsen or fail to improve  Levonne Hubert PA-C

## 2017-11-26 DIAGNOSIS — Z5181 Encounter for therapeutic drug level monitoring: Secondary | ICD-10-CM | POA: Diagnosis not present

## 2017-11-26 DIAGNOSIS — E890 Postprocedural hypothyroidism: Secondary | ICD-10-CM | POA: Diagnosis not present

## 2017-11-26 DIAGNOSIS — Z8639 Personal history of other endocrine, nutritional and metabolic disease: Secondary | ICD-10-CM | POA: Diagnosis not present

## 2017-11-26 DIAGNOSIS — M8580 Other specified disorders of bone density and structure, unspecified site: Secondary | ICD-10-CM | POA: Diagnosis not present

## 2017-11-27 ENCOUNTER — Other Ambulatory Visit: Payer: Self-pay | Admitting: Physician Assistant

## 2017-11-27 DIAGNOSIS — E890 Postprocedural hypothyroidism: Secondary | ICD-10-CM

## 2017-11-27 LAB — COMPREHENSIVE METABOLIC PANEL
AG Ratio: 1.7 (calc) (ref 1.0–2.5)
ALT: 16 U/L (ref 6–29)
AST: 13 U/L (ref 10–35)
Albumin: 4.2 g/dL (ref 3.6–5.1)
Alkaline phosphatase (APISO): 98 U/L (ref 33–130)
BUN: 14 mg/dL (ref 7–25)
CO2: 29 mmol/L (ref 20–32)
Calcium: 9.1 mg/dL (ref 8.6–10.4)
Chloride: 101 mmol/L (ref 98–110)
Creat: 0.8 mg/dL (ref 0.50–1.05)
Globulin: 2.5 g/dL (calc) (ref 1.9–3.7)
Glucose, Bld: 102 mg/dL — ABNORMAL HIGH (ref 65–99)
Potassium: 4.2 mmol/L (ref 3.5–5.3)
Sodium: 139 mmol/L (ref 135–146)
Total Bilirubin: 0.7 mg/dL (ref 0.2–1.2)
Total Protein: 6.7 g/dL (ref 6.1–8.1)

## 2017-11-27 LAB — CBC
HCT: 38.4 % (ref 35.0–45.0)
Hemoglobin: 13 g/dL (ref 11.7–15.5)
MCH: 30.3 pg (ref 27.0–33.0)
MCHC: 33.9 g/dL (ref 32.0–36.0)
MCV: 89.5 fL (ref 80.0–100.0)
MPV: 10.1 fL (ref 7.5–12.5)
Platelets: 308 10*3/uL (ref 140–400)
RBC: 4.29 10*6/uL (ref 3.80–5.10)
RDW: 12.1 % (ref 11.0–15.0)
WBC: 5.9 10*3/uL (ref 3.8–10.8)

## 2017-11-27 LAB — LIPID PANEL W/REFLEX DIRECT LDL
Cholesterol: 137 mg/dL (ref <200)
HDL: 52 mg/dL (ref >50)
LDL Cholesterol (Calc): 62 mg/dL (calc)
Non-HDL Cholesterol (Calc): 85 mg/dL (calc) (ref <130)
Total CHOL/HDL Ratio: 2.6 (calc) (ref <5.0)
Triglycerides: 150 mg/dL — ABNORMAL HIGH (ref <150)

## 2017-11-27 LAB — HEMOGLOBIN A1C
Hgb A1c MFr Bld: 5.9 % of total Hgb — ABNORMAL HIGH (ref <5.7)
Mean Plasma Glucose: 123 (calc)
eAG (mmol/L): 6.8 (calc)

## 2017-11-27 LAB — PTH, INTACT AND CALCIUM
Calcium: 9.1 mg/dL (ref 8.6–10.4)
PTH: 25 pg/mL (ref 14–64)

## 2017-11-27 LAB — TSH: TSH: 2.55 mIU/L (ref 0.40–4.50)

## 2017-11-27 LAB — VITAMIN B12: Vitamin B-12: 303 pg/mL (ref 200–1100)

## 2017-11-27 LAB — VITAMIN D 25 HYDROXY (VIT D DEFICIENCY, FRACTURES): Vit D, 25-Hydroxy: 30 ng/mL (ref 30–100)

## 2017-11-27 MED ORDER — LEVOTHYROXINE SODIUM 112 MCG PO TABS: ORAL_TABLET | ORAL | 1 refills | Status: DC

## 2017-12-01 ENCOUNTER — Telehealth: Payer: Self-pay

## 2017-12-01 NOTE — Telephone Encounter (Signed)
Error

## 2017-12-08 ENCOUNTER — Other Ambulatory Visit: Payer: Self-pay | Admitting: Physician Assistant

## 2017-12-08 DIAGNOSIS — M8580 Other specified disorders of bone density and structure, unspecified site: Secondary | ICD-10-CM

## 2017-12-08 DIAGNOSIS — Z8639 Personal history of other endocrine, nutritional and metabolic disease: Secondary | ICD-10-CM

## 2017-12-08 DIAGNOSIS — F332 Major depressive disorder, recurrent severe without psychotic features: Secondary | ICD-10-CM

## 2017-12-08 DIAGNOSIS — E782 Mixed hyperlipidemia: Secondary | ICD-10-CM

## 2017-12-17 ENCOUNTER — Encounter: Payer: Self-pay | Admitting: Obstetrics & Gynecology

## 2017-12-17 ENCOUNTER — Ambulatory Visit (INDEPENDENT_AMBULATORY_CARE_PROVIDER_SITE_OTHER): Payer: BLUE CROSS/BLUE SHIELD | Admitting: Obstetrics & Gynecology

## 2017-12-17 DIAGNOSIS — E890 Postprocedural hypothyroidism: Secondary | ICD-10-CM

## 2017-12-17 MED ORDER — ESTRADIOL 0.1 MG/24HR TD PTWK: 0.1000 mg | MEDICATED_PATCH | TRANSDERMAL | 6 refills | Status: DC

## 2017-12-17 MED ORDER — LEVOTHYROXINE SODIUM 150 MCG PO TABS: ORAL_TABLET | ORAL | 6 refills | Status: DC

## 2017-12-17 NOTE — Progress Notes (Signed)
   Subjective:    Patient ID: Allison Reyes, female    DOB: 12/17/1960, 57 y.o.   MRN: [id number]  HPI 57 yo lady here for follow up after restarting estradiol for menopausal symptoms. It has provided minimal relief. The main symptom is still fatigue. She cannot think of anything that makes this better or worse.    Review of Systems She declines a flu vaccine today.    Objective:   Physical Exam Breathing, conversing, and ambulating normally Well nourished, well hydrated White female, no apparent distress Abd- benign     Assessment & Plan:  Fatigue- rec increase synthroid slightly, recheck TSH in 6 weeks ERT- suggest change to patch, also to avoid increasing her lipids

## 2017-12-18 DIAGNOSIS — K219 Gastro-esophageal reflux disease without esophagitis: Secondary | ICD-10-CM | POA: Diagnosis not present

## 2017-12-18 DIAGNOSIS — G4489 Other headache syndrome: Secondary | ICD-10-CM | POA: Diagnosis not present

## 2017-12-18 DIAGNOSIS — J301 Allergic rhinitis due to pollen: Secondary | ICD-10-CM | POA: Diagnosis not present

## 2018-01-09 ENCOUNTER — Ambulatory Visit: Payer: BLUE CROSS/BLUE SHIELD | Admitting: Physician Assistant

## 2018-01-13 ENCOUNTER — Other Ambulatory Visit: Payer: Self-pay | Admitting: Physician Assistant

## 2018-01-13 DIAGNOSIS — E890 Postprocedural hypothyroidism: Secondary | ICD-10-CM

## 2018-01-28 ENCOUNTER — Ambulatory Visit: Payer: BLUE CROSS/BLUE SHIELD

## 2018-05-21 ENCOUNTER — Other Ambulatory Visit: Payer: Self-pay | Admitting: Physician Assistant

## 2018-05-21 DIAGNOSIS — K219 Gastro-esophageal reflux disease without esophagitis: Secondary | ICD-10-CM

## 2018-05-21 DIAGNOSIS — E782 Mixed hyperlipidemia: Secondary | ICD-10-CM

## 2018-05-21 DIAGNOSIS — F332 Major depressive disorder, recurrent severe without psychotic features: Secondary | ICD-10-CM

## 2018-05-21 DIAGNOSIS — J3089 Other allergic rhinitis: Secondary | ICD-10-CM

## 2018-06-02 ENCOUNTER — Encounter: Payer: Self-pay | Admitting: Physician Assistant

## 2018-06-02 ENCOUNTER — Ambulatory Visit (INDEPENDENT_AMBULATORY_CARE_PROVIDER_SITE_OTHER): Payer: BLUE CROSS/BLUE SHIELD

## 2018-06-02 ENCOUNTER — Ambulatory Visit: Payer: BLUE CROSS/BLUE SHIELD | Admitting: Sports Medicine

## 2018-06-02 ENCOUNTER — Ambulatory Visit: Payer: Self-pay | Admitting: Physician Assistant

## 2018-06-02 VITALS — BP 138/80 | HR 91 | Temp 97.5°F | Wt 245.0 lb

## 2018-06-02 DIAGNOSIS — J019 Acute sinusitis, unspecified: Secondary | ICD-10-CM | POA: Diagnosis not present

## 2018-06-02 DIAGNOSIS — R350 Frequency of micturition: Secondary | ICD-10-CM

## 2018-06-02 DIAGNOSIS — N393 Stress incontinence (female) (male): Secondary | ICD-10-CM

## 2018-06-02 DIAGNOSIS — R82998 Other abnormal findings in urine: Secondary | ICD-10-CM | POA: Diagnosis not present

## 2018-06-02 DIAGNOSIS — R05 Cough: Secondary | ICD-10-CM

## 2018-06-02 DIAGNOSIS — R3915 Urgency of urination: Secondary | ICD-10-CM | POA: Diagnosis not present

## 2018-06-02 DIAGNOSIS — R6883 Chills (without fever): Secondary | ICD-10-CM

## 2018-06-02 DIAGNOSIS — B9689 Other specified bacterial agents as the cause of diseases classified elsewhere: Secondary | ICD-10-CM

## 2018-06-02 DIAGNOSIS — R058 Other specified cough: Secondary | ICD-10-CM

## 2018-06-02 LAB — POCT URINALYSIS DIPSTICK
Bilirubin, UA: NEGATIVE
Blood, UA: NEGATIVE
Glucose, UA: NEGATIVE
Ketones, UA: NEGATIVE
Nitrite, UA: NEGATIVE
Protein, UA: NEGATIVE
Spec Grav, UA: 1.02 (ref 1.010–1.025)
Urobilinogen, UA: 0.2 E.U./dL
pH, UA: 6 (ref 5.0–8.0)

## 2018-06-02 LAB — POCT INFLUENZA A/B
Influenza A, POC: NEGATIVE
Influenza B, POC: NEGATIVE

## 2018-06-02 MED ORDER — MIRABEGRON ER 25 MG PO TB24: 25.0000 mg | ORAL_TABLET | Freq: Every day | ORAL | 3 refills | Status: DC

## 2018-06-02 MED ORDER — HYDROCOD POLST-CPM POLST ER 10-8 MG/5ML PO SUER: 5.0000 mL | Freq: Every evening | ORAL | 0 refills | Status: AC | PRN

## 2018-06-02 MED ORDER — AMOXICILLIN-POT CLAVULANATE 875-125 MG PO TABS: 1.0000 | ORAL_TABLET | Freq: Two times a day (BID) | ORAL | 0 refills | Status: AC

## 2018-06-02 NOTE — Progress Notes (Signed)
HPI:                                                                Allison Reyes is a 58 y.o. female who [facility]: Primary Care Sports Medicine today for malaise/fatigue/cold sx  Reports cold sx waxing and waning for approx 2 weeks. Wasn't feeling too bad. Taking OTC medications with mild relief. She woke up 2 days ago with feeling of generalized malaise, myalgias, chills and severe fatigue. Endorses right-sided dental pain and frontal headache today. Reports non-productive cough and chest "feels heavy." No fever, dyspnea, hemoptysis, wheezing. No hx of pulmonary disease or PNA.  Also having urinary frequency and urgency for the last year, gradually worsening over the last 2 months. She describes a "deep pressure" with urge to urinate and occasional urinary leakage with coughing. She is s/p TAH for severe endometriosis. No prior hx of nephrolithiasis or pyelonephritis. She saw OB/GYN and was referred to Urology, but never followed-up.  Past Medical History:  Diagnosis Date  . Depression   . GERD (gastroesophageal reflux disease)   . Hyperlipidemia   . Hyperparathyroidism   . Osteopenia   . Stress incontinence    Past Surgical History:  Procedure Laterality Date  . ABDOMINAL HYSTERECTOMY     TAH - endometriosis  . LAPAROSCOPIC ENDOMETRIOSIS FULGURATION    . LAPAROTOMY    . LUMBAR LAMINECTOMY/DECOMPRESSION MICRODISCECTOMY Left 03/07/2017   Procedure: L4-5 Discectomy;  Surgeon: Coletta Memosabbell, Kyle, MD;  [facility];  Service: Neurosurgery;  Laterality: Left;  . NASAL SINUS SURGERY  01/03/11  . OOPHORECTOMY    . THYROIDECTOMY, PARTIAL     Social History   Tobacco Use  . Smoking status: Light Tobacco Smoker    Types: Cigarettes  . Smokeless tobacco: Never Used  . Tobacco comment: 7 cigarettes daily.  Substance Use Topics  . Alcohol use: Yes    Comment: social-wine   family history includes Alcohol abuse in her mother; Coronary artery disease  in her mother; Depression in her mother; Hypertension in her father and mother; Lung cancer in her father; Stroke in her paternal grandfather.    ROS: negative except as noted in the HPI  Medications: Current Outpatient Medications  Medication Sig Dispense Refill  . amoxicillin-clavulanate (AUGMENTIN) 875-125 MG tablet Take 1 tablet by mouth 2 (two) times daily for 7 days. 14 tablet 0  . aspirin EC 81 MG tablet Take 1 tablet (81 mg total) by mouth daily. 90 tablet 3  . atorvastatin (LIPITOR) 40 MG tablet TAKE 1 TABLET(40 MG) BY MOUTH AT BEDTIME 90 tablet 1  . buPROPion (WELLBUTRIN XL) 150 MG 24 hr tablet Take 1 tablet (150 mg total) by mouth daily. Due for follow up visit w/PCP 30 tablet 0  . calcitRIOL (ROCALTROL) 0.5 MCG capsule Take 3 capsules (1.5 mcg total) by mouth daily. 270 capsule 1  . cetirizine (ZYRTEC) 10 MG tablet Take 1 tablet (10 mg total) by mouth daily. 90 tablet 3  . chlorpheniramine-HYDROcodone (TUSSIONEX) 10-8 MG/5ML SUER Take 5 mLs by mouth at bedtime as needed for up to 5 days for cough. 25 mL 0  . citalopram (CELEXA) 40 MG tablet Take 1 tablet (40 mg total) by mouth daily. 90 tablet 1  . esomeprazole (NEXIUM) 20  MG capsule TAKE ONE CAPSULE BY MOUTH DAILY 90 capsule 0  . estradiol (CLIMARA) 0.1 mg/24hr patch Place 1 patch (0.1 mg total) onto the skin once a week. 12 patch 6  . levocetirizine (XYZAL) 5 MG tablet TAKE 1 TABLET(5 MG) BY MOUTH EVERY EVENING 90 tablet 1  . levothyroxine (SYNTHROID, LEVOTHROID) 150 MCG tablet TAKE 1 TABLET(112 MCG) BY MOUTH DAILY BEFORE BREAKFAST 90 tablet 6  . mirabegron ER (MYRBETRIQ) 25 MG TB24 tablet Take 1 tablet (25 mg total) by mouth daily. 90 tablet 3  . montelukast (SINGULAIR) 10 MG tablet TAKE 1 TABLET(10 MG) BY MOUTH AT BEDTIME 90 tablet 1  . predniSONE (DELTASONE) 50 MG tablet Take 1 tablet (50 mg total) by mouth daily. 5 tablet 0  . ROPINIRole HCl (REQUIP PO) Take by mouth daily as needed (RLS).    Marland Kitchen tiZANidine (ZANAFLEX) 4 MG  tablet Take 1 tablet (4 mg total) every 6 (six) hours as needed by mouth for muscle spasms. 60 tablet 0  . zolpidem (AMBIEN) 10 MG tablet Take 1 tablet (10 mg total) by mouth at bedtime as needed. 45 tablet 0   No current facility-administered medications for this visit.    No Known Allergies     Objective:  BP 138/80   Pulse 91   Temp (!) 97.5 F (36.4 C) (Oral)   Wt 245 lb (111.1 kg)   SpO2 97%   BMI 43.40 kg/m  Gen:  alert, ill-appearing, not toxic-appearing, no distress, appropriate for age HEENT: head normocephalic without obvious abnormality, conjunctiva and cornea clear, TM's pearly gray and semi-transparent, left-sided maxillary sinus tenderness, oropharynx clear, uvula midline, moist mucous membranes, neck supple, no cervical adenopathy, trachea midline Pulm: Normal work of breathing, normal phonation, clear to auscultation bilaterally, no wheezes, rales or rhonchi CV: Normal rate, regular rhythm, s1 and s2 distinct, no murmurs, clicks or rubs  GI: abdomen soft, non-tender, no CVA tenderness Neuro: alert and oriented x 3, no tremor MSK: extremities atraumatic, normal gait and station Skin: warm, diaphoretic, no rashes on exposed skin, no jaundice, no cyanosis Psych: well-groomed, cooperative, good eye contact, euthymic mood, affect mood-congruent, speech is articulate, and thought processes clear and goal-directed    Results for orders placed or performed in visit on 06/02/18 (from the past 72 hour(s))  POCT Urinalysis Dipstick     Status: Abnormal   Collection Time: 06/02/18  1:30 PM  Result Value Ref Range   Color, UA yellow    Clarity, UA cloudy    Glucose, UA Negative Negative   Bilirubin, UA negative    Ketones, UA negative    Spec Grav, UA 1.020 1.010 - 1.025   Blood, UA negative    pH, UA 6.0 5.0 - 8.0   Protein, UA Negative Negative   Urobilinogen, UA 0.2 0.2 or 1.0 E.U./dL   Nitrite, UA negative    Leukocytes, UA Large (3+) (A) Negative   Appearance      Odor    Urine Culture     Status: None   Collection Time: 06/02/18  1:30 PM  Result Value Ref Range   MICRO NUMBER: [id number]    SPECIMEN QUALITY: Adequate    Sample Source URINE    STATUS: FINAL    Result:      Three or more organisms present, each greater than 10,000 CFU/mL. May represent normal flora contamination from external genitalia. No further testing is required.  POCT Influenza A/B     Status: Normal   Collection Time: 06/02/18  1:41 PM  Result Value Ref Range   Influenza A, POC Negative Negative   Influenza B, POC Negative Negative   Dg Chest 2 View  Result Date: 06/02/2018 CLINICAL DATA:  Ex-smoker with nonproductive cough for 2 weeks. EXAM: CHEST - 2 VIEW COMPARISON:  Radiographs 12/06/2005.  Abdominal CT 03/04/2017. FINDINGS: The heart size and mediastinal contours are normal. The lungs are clear. There is no pleural effusion or pneumothorax. No acute osseous findings are identified. IMPRESSION: No active cardiopulmonary process. Electronically Signed   By: Carey Bullocks M.D.   On: 06/02/2018 14:17      Assessment and Plan: 58 y.o. female with   .Jameesha was seen today for chills.  Diagnoses and all orders for this visit:  Acute bacterial sinusitis -     amoxicillin-clavulanate (AUGMENTIN) 875-125 MG tablet; Take 1 tablet by mouth 2 (two) times daily for 7 days.  Chills -     POCT Influenza A/B  Urinary urgency -     POCT Urinalysis Dipstick -     Urine Culture -     Ambulatory referral to Urology -     mirabegron ER (MYRBETRIQ) 25 MG TB24 tablet; Take 1 tablet (25 mg total) by mouth daily. -     C. trachomatis/N. gonorrhoeae RNA  Urine leukocytes increased -     Ambulatory referral to Urology -     C. trachomatis/N. gonorrhoeae RNA  Urinary frequency  Nonproductive cough -     chlorpheniramine-HYDROcodone (TUSSIONEX) 10-8 MG/5ML SUER; Take 5 mLs by mouth at bedtime as needed for up to 5 days for cough. -     DG Chest 2 View  Stress  incontinence   Acute sinusitis, nonproductive cough Currently afebrile, no tachypnea, no tachycardia, pulse ox 97% on room air at rest, no adventitious lung sounds Waxing and waning URI symptoms for over 2 weeks with reported second sickening 2 days ago.  Treating empirically for bacterial sinusitis.  We will obtain chest x-ray today to rule out infiltrate Point-of-care influenza AB negative Counseled on supportive care.  Encouraged to use expectorant during the day.  Okay to use Tussionex as needed in the evening for nocturnal cough.  Counseled to avoid combining with other sedating medications and alcohol.   Urinary urgency/frequency/suprapubic pressure This is a chronic problem for over a year, gradually worsening over the last several months Point-of-care urinalyses have showed urine leukocytes on multiple occasions, however urine cultures have always been inconclusive or negative Differential includes overactive bladder versus interstitial cystitis We will trial Myrbetriq and refer to urology    Patient education and anticipatory guidance given Patient agrees with treatment plan Follow-up as needed if symptoms worsen or fail to improve  Levonne Hubert PA-C

## 2018-06-02 NOTE — Patient Instructions (Addendum)
Collect first morning urine (dirty catch=do not cleanse with sterile wipe) Return to lab the same day  For nasal symptoms/sinusitis: - nasal saline rinses / netti pot several times per day (do this prior to nasal spray) - prescription Atrovent nasal spray: 2 sprays each nostril, up to 4 times per day as needed - you can use OTC nasal decongestant sprays like Afrin (oxymetazoline) BUT do not use for more than 3 days as it will cause worsening congestion/nasal symptoms) - warm facial compresses - oral decongestants and antihistamines like Claritin-D and Zyrtec-D may help dry up secretions (caution using decongestants if you have high blood pressure, heart disease or kidney disease) - for sinus headache: Tylenol 1000mg  every 8 hours as needed. Alternate with Ibuprofen 600mg  every 6 hours   For cough: - Cough is a protective mechanism and an important part of fighting an infection. I encourage you to avoid suppressing your cough with medication during the day if possible.  - Mucinex with at least 8 oz. of water can loosen chest congestion and make cough more productive, which means you will actually cough less - Okay to use a cough suppressant at bedtime in order to rest   Note: follow package instructions for all over-the-counter medications. If using multi-symptom medications (Dayquil, Theraflu, etc.), check the label for duplicate drug ingredients.  Overactive Bladder, Adult  Overactive bladder refers to a condition in which a person has a sudden need to pass urine. The person may leak urine if he or she cannot get to the bathroom fast enough (urinary incontinence). A person with this condition may also wake up several times in the night to go to the bathroom. Overactive bladder is associated with poor nerve signals between your bladder and your brain. Your bladder may get the signal to empty before it is full. You may also have very sensitive muscles that make your bladder squeeze too soon.  These symptoms might interfere with daily work or social activities. What are the causes? This condition may be associated with or caused by:  Urinary tract infection.  Infection of nearby tissues, such as the prostate.  Prostate enlargement.  Surgery on the uterus or urethra.  Bladder stones, inflammation, or tumors.  Drinking too much caffeine or alcohol.  Certain medicines, especially medicines that get rid of extra fluid in the body (diuretics).  Muscle or nerve weakness, especially from: ? A spinal cord injury. ? Stroke. ? Multiple sclerosis. ? Parkinson's disease.  Diabetes.  Constipation. What increases the risk? You may be at greater risk for overactive bladder if you:  Are an older adult.  Smoke.  Are going through menopause.  Have prostate problems.  Have a neurological disease, such as stroke, dementia, Parkinson's disease, or multiple sclerosis (MS).  Eat or drink things that irritate the bladder. These include alcohol, spicy food, and caffeine.  Are overweight or obese. What are the signs or symptoms? Symptoms of this condition include:  Sudden, strong urge to urinate.  Leaking urine.  Urinating 8 or more times a day.  Waking up to urinate 2 or more times a night. How is this diagnosed? Your health care provider may suspect overactive bladder based on your symptoms. He or she will diagnose this condition by:  A physical exam and medical history.  Blood or urine tests. You might need bladder or urine tests to help determine what is causing your overactive bladder. You might also need to see a health care provider who specializes in urinary tract problems (  urologist). How is this treated? Treatment for overactive bladder depends on the cause of your condition and whether it is mild or severe. You can also make lifestyle changes at home. Options include:  Bladder training. This may include: ? Learning to control the urge to urinate by  following a schedule that directs you to urinate at regular intervals (timed voiding). ? Doing Kegel exercises to strengthen your pelvic floor muscles, which support your bladder. Toning these muscles can help you control urination, even if your bladder muscles are overactive.  Special devices. This may include: ? Biofeedback, which uses sensors to help you become aware of your body's signals. ? Electrical stimulation, which uses electrodes placed inside the body (implanted) or outside the body. These electrodes send gentle pulses of electricity to strengthen the nerves or muscles that control the bladder. ? Women may use a plastic device that fits into the vagina and supports the bladder (pessary).  Medicines. ? Antibiotics to treat bladder infection. ? Antispasmodics to stop the bladder from releasing urine at the wrong time. ? Tricyclic antidepressants to relax bladder muscles. ? Injections of botulinum toxin type A directly into the bladder tissue to relax bladder muscles.  Lifestyle changes. This may include: ? Weight loss. Talk to your health care provider about weight loss methods that would work best for you. ? Diet changes. This may include reducing how much alcohol and caffeine you consume, or drinking fluids at different times of the day. ? Not smoking. Do not use any products that contain nicotine or tobacco, such as cigarettes and e-cigarettes. If you need help quitting, ask your health care provider.  Surgery. ? A device may be implanted to help manage the nerve signals that control urination. ? An electrode may be implanted to stimulate electrical signals in the bladder. ? A procedure may be done to change the shape of the bladder. This is done only in very severe cases. Follow these instructions at home: Lifestyle  Make any diet or lifestyle changes that are recommended by your health care provider. These may include: ? Drinking less fluid or drinking fluids at different  times of the day. ? Cutting down on caffeine or alcohol. ? Doing Kegel exercises. ? Losing weight if needed. ? Eating a healthy and balanced diet to prevent constipation. This may include:  Eating foods that are high in fiber, such as fresh fruits and vegetables, whole grains, and beans.  Limiting foods that are high in fat and processed sugars, such as fried and sweet foods. General instructions  Take over-the-counter and prescription medicines only as told by your health care provider.  If you were prescribed an antibiotic medicine, take it as told by your health care provider. Do not stop taking the antibiotic even if you start to feel better.  Use any implants or pessary as told by your health care provider.  If needed, wear pads to absorb urine leakage.  Keep a journal or log to track how much and when you drink and when you feel the need to urinate. This will help your health care provider monitor your condition.  Keep all follow-up visits as told by your health care provider. This is important. Contact a health care provider if:  You have a fever.  Your symptoms do not get better with treatment.  Your pain and discomfort get worse.  You have more frequent urges to urinate. Get help right away if:  You are not able to control your bladder. Summary  Overactive  bladder refers to a condition in which a person has a sudden need to pass urine.  Several conditions may lead to an overactive bladder.  Treatment for overactive bladder depends on the cause and severity of your condition.  Follow your health care provider's instructions about lifestyle changes, doing Kegel exercises, keeping a journal, and taking medicines. This information is not intended to replace advice given to you by your health care provider. Make sure you discuss any questions you have with your health care provider. Document Released: 02/09/2009 Document Revised: 05/01/2017 Document Reviewed:  05/01/2017 Elsevier Interactive Patient Education  2019 [employer].

## 2018-06-03 LAB — URINE CULTURE
MICRO NUMBER:: 148885
SPECIMEN QUALITY:: ADEQUATE

## 2018-06-04 ENCOUNTER — Encounter: Payer: Self-pay | Admitting: Physician Assistant

## 2018-06-04 DIAGNOSIS — R82998 Other abnormal findings in urine: Secondary | ICD-10-CM | POA: Insufficient documentation

## 2018-06-04 DIAGNOSIS — R3915 Urgency of urination: Secondary | ICD-10-CM | POA: Insufficient documentation

## 2018-06-08 LAB — C. TRACHOMATIS/N. GONORRHOEAE RNA
C. trachomatis RNA, TMA: NOT DETECTED
N. gonorrhoeae RNA, TMA: NOT DETECTED

## 2018-06-13 ENCOUNTER — Other Ambulatory Visit: Payer: Self-pay | Admitting: Physician Assistant

## 2018-06-13 DIAGNOSIS — Z8639 Personal history of other endocrine, nutritional and metabolic disease: Secondary | ICD-10-CM

## 2018-06-13 DIAGNOSIS — M8580 Other specified disorders of bone density and structure, unspecified site: Secondary | ICD-10-CM

## 2018-06-13 DIAGNOSIS — F332 Major depressive disorder, recurrent severe without psychotic features: Secondary | ICD-10-CM

## 2018-06-27 ENCOUNTER — Other Ambulatory Visit: Payer: Self-pay | Admitting: Physician Assistant

## 2018-06-27 DIAGNOSIS — F332 Major depressive disorder, recurrent severe without psychotic features: Secondary | ICD-10-CM

## 2018-07-13 ENCOUNTER — Ambulatory Visit: Payer: BLUE CROSS/BLUE SHIELD | Admitting: Physician Assistant

## 2018-07-13 ENCOUNTER — Encounter: Payer: Self-pay | Admitting: Physician Assistant

## 2018-07-13 ENCOUNTER — Other Ambulatory Visit: Payer: Self-pay

## 2018-07-13 VITALS — BP 123/83 | HR 90 | Temp 98.3°F | Wt 243.0 lb

## 2018-07-13 DIAGNOSIS — R6889 Other general symptoms and signs: Secondary | ICD-10-CM | POA: Diagnosis not present

## 2018-07-13 DIAGNOSIS — J988 Other specified respiratory disorders: Secondary | ICD-10-CM

## 2018-07-13 DIAGNOSIS — B9789 Other viral agents as the cause of diseases classified elsewhere: Secondary | ICD-10-CM | POA: Diagnosis not present

## 2018-07-13 LAB — POCT INFLUENZA A/B
Influenza A, POC: NEGATIVE
Influenza B, POC: NEGATIVE

## 2018-07-13 MED ORDER — GUAIFENESIN-CODEINE 100-10 MG/5ML PO SYRP: 10.0000 mL | ORAL_SOLUTION | Freq: Four times a day (QID) | ORAL | 0 refills | Status: AC | PRN

## 2018-07-13 NOTE — Patient Instructions (Addendum)
For nasal symptoms/sinusitis: - nasal saline rinses / netti pot several times per day (do this prior to nasal spray) - you can use OTC nasal decongestant sprays like Afrin (oxymetazoline) BUT do not use for more than 3 days as it will cause worsening congestion/nasal symptoms) - warm facial compresses - oral decongestants and antihistamines like Claritin-D and Zyrtec-D may help dry up secretions (caution using decongestants if you have high blood pressure, heart disease or kidney disease) - for sinus headache: Tylenol 1000mg every 8 hours as needed. Alternate with Ibuprofen 600mg every 6 hours  For sore throat: - Tylenol 1000mg every 8 hours as needed for throat pain. Alternate with Ibuprofen 600mg every 6 hours - Cepacol throat lozenges and/or Chloraseptic spray - Warm salt water gargles  For cough: - Cough is a protective mechanism and an important part of fighting an infection. I encourage you to avoid suppressing your cough with medication during the day if possible.  - Mucinex with at least 8 oz. of water can loosen chest congestion and make cough more productive, which means you will actually cough less - Okay to use a cough suppressant at bedtime in order to rest (Nyquil, Delsym, Robitussin, etc.)  Note: follow package instructions for all over-the-counter medications. If using multi-symptom medications (Dayquil, Theraflu, etc.), check the label for duplicate drug ingredients.   Viral Respiratory Infection A respiratory infection is an illness that affects part of the respiratory system, such as the lungs, nose, or throat. A respiratory infection that is caused by a virus is called a viral respiratory infection. Common types of viral respiratory infections include:  A cold.  The flu (influenza).  A respiratory syncytial virus (RSV) infection. What are the causes? This condition is caused by a virus. What are the signs or symptoms? Symptoms of this condition include:  A stuffy  or runny nose.  Yellow or green nasal discharge.  A cough.  Sneezing.  Fatigue.  Achy muscles.  A sore throat.  Sweating or chills.  A fever.  A headache. How is this diagnosed? This condition may be diagnosed based on:  Your symptoms.  A physical exam.  Testing of nasal swabs. How is this treated? This condition may be treated with medicines, such as:  Antiviral medicine. This may shorten the length of time a person has symptoms.  Expectorants. These make it easier to cough up mucus.  Decongestant nasal sprays.  Acetaminophen or NSAIDs to relieve fever and pain. Antibiotic medicines are not prescribed for viral infections. This is because antibiotics are designed to kill bacteria. They are not effective against viruses. Follow these instructions at home:  Managing pain and congestion  Take over-the-counter and prescription medicines only as told by your health care provider.  If you have a sore throat, gargle with a salt-water mixture 3-4 times a day or as needed. To make a salt-water mixture, completely dissolve -1 tsp of salt in 1 cup of warm water.  Use nose drops made from salt water to ease congestion and soften raw skin around your nose.  Drink enough fluid to keep your urine pale yellow. This helps prevent dehydration and helps loosen up mucus. General instructions  Rest as much as possible.  Do not drink alcohol.  Do not use any products that contain nicotine or tobacco, such as cigarettes and e-cigarettes. If you need help quitting, ask your health care provider.  Keep all follow-up visits as told by your health care provider. This is important. How is this prevented?     Get an annual flu shot. You may get the flu shot in late summer, fall, or winter. Ask your health care provider when you should get your flu shot.  Avoid exposing others to your respiratory infection. ? Stay home from work or school as told by your health care provider. ?  Wash your hands with soap and water often, especially after you cough or sneeze. If soap and water are not available, use alcohol-based hand sanitizer.  Avoid contact with people who are sick during cold and flu season. This is generally fall and winter. Contact a health care provider if:  Your symptoms last for 10 days or longer.  Your symptoms get worse over time.  You have a fever.  You have severe sinus pain in your face or forehead.  The glands in your jaw or neck become very swollen. Get help right away if you:  Feel pain or pressure in your chest.  Have shortness of breath.  Faint or feel like you will faint.  Have severe and persistent vomiting.  Feel confused or disoriented. Summary  A respiratory infection is an illness that affects part of the respiratory system, such as the lungs, nose, or throat. A respiratory infection that is caused by a virus is called a viral respiratory infection.  Common types of viral respiratory infections are a cold, influenza, and respiratory syncytial virus (RSV) infection.  Symptoms of this condition include a stuffy or runny nose, cough, sneezing, fatigue, achy muscles, sore throat, and fevers or chills.  Antibiotic medicines are not prescribed for viral infections. This is because antibiotics are designed to kill bacteria. They are not effective against viruses. This information is not intended to replace advice given to you by your health care provider. Make sure you discuss any questions you have with your health care provider. Document Released: 01/23/2005 Document Revised: 05/26/2017 Document Reviewed: 05/26/2017 Elsevier Interactive Patient Education  2019 [employer]  

## 2018-07-13 NOTE — Progress Notes (Signed)
HPI:                                                                Allison Reyes is a 58 y.o. female who presents to [name] today for cough  Cough  This is a new problem. The current episode started in the past 7 days (x 4-5 days). The problem has been gradually worsening. The cough is productive of sputum. Associated symptoms include chills, ear congestion, headaches, myalgias, nasal congestion and shortness of breath. Nothing aggravates the symptoms. She has tried prescription cough suppressant (left-over Hycodan) for the symptoms. The treatment provided no relief.  Reports close contact with a coworker diagnosed with influenza last week Denies recent travel   Past Medical History:  Diagnosis Date  . Depression   . GERD (gastroesophageal reflux disease)   . Hyperlipidemia   . Hyperparathyroidism   . Osteopenia   . Stress incontinence    Past Surgical History:  Procedure Laterality Date  . ABDOMINAL HYSTERECTOMY     TAH - endometriosis  . LAPAROSCOPIC ENDOMETRIOSIS FULGURATION    . LAPAROTOMY    . LUMBAR LAMINECTOMY/DECOMPRESSION MICRODISCECTOMY Left 03/07/2017   Procedure: L4-5 Discectomy;  Surgeon: Coletta Memos, MD;  Location: [facility] OR;  Service: Neurosurgery;  Laterality: Left;  . NASAL SINUS SURGERY  01/03/11  . OOPHORECTOMY    . THYROIDECTOMY, PARTIAL     Social History   Tobacco Use  . Smoking status: Light Tobacco Smoker    Types: Cigarettes  . Smokeless tobacco: Never Used  . Tobacco comment: 7 cigarettes daily.  Substance Use Topics  . Alcohol use: Yes    Comment: social-wine   family history includes Alcohol abuse in her mother; Coronary artery disease in her mother; Depression in her mother; Hypertension in her father and mother; Lung cancer in her father; Stroke in her paternal grandfather.    ROS: negative except as noted in the HPI  Medications: Current Outpatient Medications  Medication Sig  Dispense Refill  . aspirin EC 81 MG tablet Take 1 tablet (81 mg total) by mouth daily. 90 tablet 3  . atorvastatin (LIPITOR) 40 MG tablet TAKE 1 TABLET(40 MG) BY MOUTH AT BEDTIME 90 tablet 1  . buPROPion (WELLBUTRIN XL) 150 MG 24 hr tablet TAKE 1 TABLET BY MOUTH DAILY 90 tablet 0  . calcitRIOL (ROCALTROL) 0.5 MCG capsule TAKE 3 CAPSULES(1.5 MCG) BY MOUTH DAILY 270 capsule 1  . cetirizine (ZYRTEC) 10 MG tablet Take 1 tablet (10 mg total) by mouth daily. 90 tablet 3  . citalopram (CELEXA) 40 MG tablet TAKE 1 TABLET(40 MG) BY MOUTH DAILY 90 tablet 0  . esomeprazole (NEXIUM) 20 MG capsule TAKE ONE CAPSULE BY MOUTH DAILY 90 capsule 0  . estradiol (CLIMARA) 0.1 mg/24hr patch Place 1 patch (0.1 mg total) onto the skin once a week. 12 patch 6  . guaiFENesin-codeine (ROBITUSSIN AC) 100-10 MG/5ML syrup Take 10 mLs by mouth every 6 (six) hours as needed for up to 5 days for cough. Max Dose 60 ml / 23 hours 200 mL 0  . levocetirizine (XYZAL) 5 MG tablet TAKE 1 TABLET(5 MG) BY MOUTH EVERY EVENING 90 tablet 1  . levothyroxine (SYNTHROID, LEVOTHROID) 150 MCG tablet TAKE 1 TABLET(112 MCG) BY MOUTH DAILY  BEFORE BREAKFAST 90 tablet 6  . mirabegron ER (MYRBETRIQ) 25 MG TB24 tablet Take 1 tablet (25 mg total) by mouth daily. 90 tablet 3  . montelukast (SINGULAIR) 10 MG tablet TAKE 1 TABLET(10 MG) BY MOUTH AT BEDTIME 90 tablet 1  . ROPINIRole HCl (REQUIP PO) Take by mouth daily as needed (RLS).    Marland Kitchen tiZANidine (ZANAFLEX) 4 MG tablet Take 1 tablet (4 mg total) every 6 (six) hours as needed by mouth for muscle spasms. 60 tablet 0  . zolpidem (AMBIEN) 10 MG tablet Take 1 tablet (10 mg total) by mouth at bedtime as needed. 45 tablet 0   No current facility-administered medications for this visit.    No Known Allergies     Objective:  BP 123/83   Pulse 90   Temp 98.3 F (36.8 C) (Oral)   Wt 243 lb (110.2 kg)   SpO2 98%   BMI 43.05 kg/m  Gen:  alert, ill-appearing, not toxic-appearing, no distress,  appropriate for age HEENT: head normocephalic without obvious abnormality, conjunctiva and cornea clear, tympanic membranes pearly gray and semitransparent bilaterally, nasal mucosa edematous, there is frontal sinus tenderness, oropharynx clear, uvula midline, moist mucous membranes, neck supple, no cervical adenopathy trachea midline Pulm: Normal work of breathing, normal phonation, clear to auscultation bilaterally, no wheezes, rales or rhonchi CV: Normal rate, regular rhythm, s1 and s2 distinct, no murmurs, clicks or rubs  Neuro: alert and oriented x 3, no tremor MSK: extremities atraumatic, normal gait and station Skin: Warm, diaphoretic, no rashes on exposed skin, no jaundice, no cyanosis    No results found for this or any previous visit (from the past 72 hour(s)). No results found.    Assessment and Plan: 58 y.o. female with   .Allison Reyes was seen today for cough.  Diagnoses and all orders for this visit:  Viral respiratory illness -     guaiFENesin-codeine (ROBITUSSIN AC) 100-10 MG/5ML syrup; Take 10 mLs by mouth every 6 (six) hours as needed for up to 5 days for cough. Max Dose 60 ml / 23 hours  Flu-like symptoms -     POCT Influenza A/B   Afebrile, no tachypnea, no tachycardia, pulse ox 98% on RA at rest, no adventitious lung sounds POC influenza A/B testing negative Given known influenza exposure, this is the most likely diagnosis.  She is outside of the window for Tamiflu. Counseled on supportive care for viral respiratory illness  Patient education and anticipatory guidance given Patient agrees with treatment plan Follow-up as needed if symptoms worsen or fail to improve  Levonne Hubert PA-C

## 2018-07-15 ENCOUNTER — Telehealth: Payer: Self-pay

## 2018-07-15 NOTE — Telephone Encounter (Signed)
Antibiotics are not going to be helpful for this type of infection.  Cough becoming more productive is a good sign. Ear pain is likely due to eustachian tube dysfunction at this point. Continue supportive care.  Follow-up for fever, hearing loss, worsening ear pain, breathing difficulties, chest pain.

## 2018-07-15 NOTE — Telephone Encounter (Signed)
Pt was seen on Monday.  She is requesting a antibiotic.  She is now having bilateral ear pain and cough is more productive. Please advise. -EH/RMA

## 2018-07-15 NOTE — Telephone Encounter (Signed)
Patient advised of recommendations.  

## 2018-07-17 ENCOUNTER — Encounter: Payer: Self-pay | Admitting: Physician Assistant

## 2018-07-22 ENCOUNTER — Other Ambulatory Visit: Payer: Self-pay | Admitting: Physician Assistant

## 2018-07-22 DIAGNOSIS — E890 Postprocedural hypothyroidism: Secondary | ICD-10-CM

## 2018-07-27 ENCOUNTER — Telehealth: Payer: Self-pay

## 2018-07-27 NOTE — Telephone Encounter (Signed)
Pt stated that her ears are still ringing, throbbing, & painful. She reports having an e-visit and was Rx'd amoxicillin.  She said that its not working for her. Please advise. -EH/RMA

## 2018-07-28 NOTE — Telephone Encounter (Signed)
Pt notified of recommendations -EH/RMA  

## 2018-07-28 NOTE — Telephone Encounter (Signed)
Continue the Amoxicillin Take Tylenol 1000 mg every 8 hours Start intranasal Flonase

## 2018-08-26 ENCOUNTER — Other Ambulatory Visit: Payer: Self-pay | Admitting: Physician Assistant

## 2018-08-26 DIAGNOSIS — K219 Gastro-esophageal reflux disease without esophagitis: Secondary | ICD-10-CM

## 2018-09-10 ENCOUNTER — Other Ambulatory Visit: Payer: Self-pay | Admitting: Physician Assistant

## 2018-09-10 DIAGNOSIS — F332 Major depressive disorder, recurrent severe without psychotic features: Secondary | ICD-10-CM

## 2018-09-27 ENCOUNTER — Other Ambulatory Visit: Payer: Self-pay | Admitting: Physician Assistant

## 2018-09-27 DIAGNOSIS — F332 Major depressive disorder, recurrent severe without psychotic features: Secondary | ICD-10-CM

## 2018-10-27 ENCOUNTER — Other Ambulatory Visit: Payer: Self-pay | Admitting: Physician Assistant

## 2018-10-27 DIAGNOSIS — F332 Major depressive disorder, recurrent severe without psychotic features: Secondary | ICD-10-CM

## 2018-11-11 ENCOUNTER — Other Ambulatory Visit: Payer: Self-pay | Admitting: Physician Assistant

## 2018-11-11 ENCOUNTER — Telehealth: Payer: Self-pay | Admitting: Physician Assistant

## 2018-11-11 ENCOUNTER — Other Ambulatory Visit: Payer: Self-pay

## 2018-11-11 DIAGNOSIS — F332 Major depressive disorder, recurrent severe without psychotic features: Secondary | ICD-10-CM

## 2018-11-11 MED ORDER — BUPROPION HCL ER (XL) 150 MG PO TB24: 150.0000 mg | ORAL_TABLET | Freq: Every day | ORAL | 0 refills | Status: DC

## 2018-11-11 NOTE — Telephone Encounter (Signed)
Patient states that she needs buPROPion (WELLBUTRIN XL) 150 MG 24 hr tablet [[id number]]  For another week until appointment. Please advise.

## 2018-11-11 NOTE — Telephone Encounter (Signed)
A 15 day supply sent to [place]

## 2018-11-20 DIAGNOSIS — Z20828 Contact with and (suspected) exposure to other viral communicable diseases: Secondary | ICD-10-CM | POA: Diagnosis not present

## 2018-11-20 DIAGNOSIS — Z1159 Encounter for screening for other viral diseases: Secondary | ICD-10-CM | POA: Diagnosis not present

## 2018-11-25 ENCOUNTER — Ambulatory Visit (INDEPENDENT_AMBULATORY_CARE_PROVIDER_SITE_OTHER): Payer: BC Managed Care – PPO | Admitting: Physician Assistant

## 2018-11-25 ENCOUNTER — Other Ambulatory Visit: Payer: Self-pay | Admitting: Physician Assistant

## 2018-11-25 ENCOUNTER — Encounter: Payer: Self-pay | Admitting: Physician Assistant

## 2018-11-25 VITALS — Temp 97.0°F

## 2018-11-25 DIAGNOSIS — G4710 Hypersomnia, unspecified: Secondary | ICD-10-CM | POA: Diagnosis not present

## 2018-11-25 DIAGNOSIS — J069 Acute upper respiratory infection, unspecified: Secondary | ICD-10-CM

## 2018-11-25 DIAGNOSIS — F329 Major depressive disorder, single episode, unspecified: Secondary | ICD-10-CM | POA: Diagnosis not present

## 2018-11-25 DIAGNOSIS — F332 Major depressive disorder, recurrent severe without psychotic features: Secondary | ICD-10-CM

## 2018-11-25 DIAGNOSIS — F32A Depression, unspecified: Secondary | ICD-10-CM

## 2018-11-25 MED ORDER — BUPROPION HCL ER (XL) 300 MG PO TB24: 300.0000 mg | ORAL_TABLET | ORAL | 1 refills | Status: DC

## 2018-11-25 NOTE — Progress Notes (Signed)
Virtual Visit via Video Note  I connected with Allison Reyes on 11/25/18 at 10:50 AM EDT by a video enabled telemedicine application and verified that I am speaking with the correct person using two identifiers.   I discussed the limitations of evaluation and management by telemedicine and the availability of in person appointments. The patient expressed understanding and agreed to proceed.  History of Present Illness: HPI:                                                                Allison Reyes is a 58 y.o. female   CC: mood, cough  Depression: feeling very anxious and very short. Endorses excessive worry about money and "everything getting on her nerves." Reports increased stress at home - lots of people living in the house and COVID-19 pandemic. Endorses passive SI, denies forming a plan or self-harming behavior.   Cough: onset Thursday night. She states she has fatigue, tactile fevers, rhinorrhea, sputum in her throat that she is unable to expectorate. She has having "random chest pains" that go away on their own. She has felt mild SOB with talking, coughing and laughing.  Reports 3 yo granddaughter and her father, who reside with her, are also sick with similar symptoms. She is taking Aleve Cold & Sinus with mild relief.  She states she tested negative for SARS-Cov2 on Saturday. She has been remaining out of work.  [facility] 11/25/2018 11/25/2017 02/14/2017 11/06/2016 04/15/2016  Decreased Interest 3 2 0 2 2  Down, Depressed, Hopeless 3 2 0 2 2  PHQ - 2 Score 6 4 0 4 4  Altered sleeping 3 3 1 3 3   Tired, decreased energy 3 3 2 3 3   Change in appetite 0 3 2 3 3   Feeling bad or failure about yourself  3 2 1 3 3   Trouble concentrating 2 2 0 2 2  Moving slowly or fidgety/restless 0 3 2 1  0  Suicidal thoughts 1 0 0 1 0  PHQ-9 Score 18 20 8 20 18   Difficult doing work/chores - Very difficult - - -    GAD 7 : Generalized Anxiety Score 11/25/2018 11/25/2017  02/14/2017 11/06/2016  Nervous, Anxious, on Edge 1 1 0 2  Control/stop worrying 2 0 0 2  Worry too much - different things 2 0 0 3  Trouble relaxing 1 2 0 1  Restless 2 2 1 1   Easily annoyed or irritable 2 1 0 2  Afraid - awful might happen 1 0 0 3  Total GAD 7 Score 11 6 1 14   Anxiety Difficulty - Not difficult at all - -      Past Medical History:  Diagnosis Date  . Depression   . GERD (gastroesophageal reflux disease)   . Hyperlipidemia   . Hyperparathyroidism   . Osteopenia   . Stress incontinence    Past Surgical History:  Procedure Laterality Date  . ABDOMINAL HYSTERECTOMY     TAH - endometriosis  . LAPAROSCOPIC ENDOMETRIOSIS FULGURATION    . LAPAROTOMY    . LUMBAR LAMINECTOMY/DECOMPRESSION MICRODISCECTOMY Left 03/07/2017   Procedure: L4-5 Discectomy;  Surgeon: Coletta Memosabbell, Kyle, MD;  [facility];  Service: Neurosurgery;  Laterality: Left;  . NASAL SINUS SURGERY  01/03/11  . OOPHORECTOMY    .  THYROIDECTOMY, PARTIAL     Social History   Tobacco Use  . Smoking status: Light Tobacco Smoker    Types: Cigarettes  . Smokeless tobacco: Never Used  . Tobacco comment: 7 cigarettes daily.  Substance Use Topics  . Alcohol use: Yes    Comment: social-wine   family history includes Alcohol abuse in her mother; Coronary artery disease in her mother; Depression in her mother; Hypertension in her father and mother; Lung cancer in her father; Stroke in her paternal grandfather.    ROS: negative except as noted in the HPI  Medications: Current Outpatient Medications  Medication Sig Dispense Refill  . aspirin EC 81 MG tablet Take 1 tablet (81 mg total) by mouth daily. 90 tablet 3  . atorvastatin (LIPITOR) 40 MG tablet TAKE 1 TABLET(40 MG) BY MOUTH AT BEDTIME 90 tablet 1  . buPROPion (WELLBUTRIN XL) 150 MG 24 hr tablet Take 1 tablet (150 mg total) by mouth daily. Due for follow up visit w/PCP 15 tablet 0  . calcitRIOL (ROCALTROL) 0.5 MCG capsule TAKE 3 CAPSULES(1.5 MCG) BY  MOUTH DAILY 270 capsule 1  . citalopram (CELEXA) 40 MG tablet TAKE 1 TABLET BY MOUTH DAILY 90 tablet 0  . esomeprazole (NEXIUM) 20 MG capsule Take 1 capsule (20 mg total) by mouth daily as needed (heartburn/indigestion/cough). TAKE ONE CAPSULE BY MOUTH DAILY 90 capsule 0  . levocetirizine (XYZAL) 5 MG tablet TAKE 1 TABLET(5 MG) BY MOUTH EVERY EVENING 90 tablet 1  . levothyroxine (SYNTHROID, LEVOTHROID) 150 MCG tablet TAKE 1 TABLET(112 MCG) BY MOUTH DAILY BEFORE BREAKFAST 90 tablet 6  . mirabegron ER (MYRBETRIQ) 25 MG TB24 tablet Take 1 tablet (25 mg total) by mouth daily. 90 tablet 3  . montelukast (SINGULAIR) 10 MG tablet TAKE 1 TABLET(10 MG) BY MOUTH AT BEDTIME 90 tablet 1  . cetirizine (ZYRTEC) 10 MG tablet Take 1 tablet (10 mg total) by mouth daily. (Patient not taking: Reported on 11/25/2018) 90 tablet 3  . estradiol (CLIMARA) 0.1 mg/24hr patch Place 1 patch (0.1 mg total) onto the skin once a week. (Patient not taking: Reported on 11/25/2018) 12 patch 6  . levothyroxine (SYNTHROID, LEVOTHROID) 112 MCG tablet TAKE 1 TABLET(112 MCG) BY MOUTH DAILY BEFORE BREAKFAST (Patient not taking: TAKE 1 TABLET(112 MCG) BY MOUTH DAILY BEFORE BREAKFAST) 90 tablet 1  . ROPINIRole HCl (REQUIP PO) Take by mouth daily as needed (RLS).    Marland Kitchen tiZANidine (ZANAFLEX) 4 MG tablet Take 1 tablet (4 mg total) every 6 (six) hours as needed by mouth for muscle spasms. (Patient not taking: Reported on 11/25/2018) 60 tablet 0  . zolpidem (AMBIEN) 10 MG tablet Take 1 tablet (10 mg total) by mouth at bedtime as needed. (Patient not taking: Reported on 11/25/2018) 45 tablet 0   No current facility-administered medications for this visit.    No Known Allergies     Objective:  Temp (!) 97 F (36.1 C) (Oral)  Wt Readings from Last 3 Encounters:  07/13/18 243 lb (110.2 kg)  06/02/18 245 lb (111.1 kg)  12/17/17 246 lb (111.6 kg)   Temp Readings from Last 3 Encounters:  11/25/18 (!) 97 F (36.1 C) (Oral)  07/13/18 98.3 F  (36.8 C) (Oral)  06/02/18 (!) 97.5 F (36.4 C) (Oral)   BP Readings from Last 3 Encounters:  07/13/18 123/83  06/02/18 138/80  12/17/17 131/77   Pulse Readings from Last 3 Encounters:  07/13/18 90  06/02/18 91  12/17/17 76    Gen:  alert, not ill-appearing,  no distress, appropriate for age HEENT: head normocephalic without obvious abnormality, conjunctiva and cornea clear, trachea midline Pulm: Normal work of breathing, normal phonation Neuro: alert and oriented x 3 Psych: cooperative, depressed mood, affect mood-congruent, speech is articulate, normal rate and volume; thought processes clear and goal-directed, normal judgment, good insight, passive SI, no HI   CLINICAL DATA:  Ex-smoker with nonproductive cough for 2 weeks.  EXAM: CHEST - 2 VIEW  COMPARISON:  Radiographs 12/06/2005.  Abdominal CT 03/04/2017.  FINDINGS: The heart size and mediastinal contours are normal. The lungs are clear. There is no pleural effusion or pneumothorax. No acute osseous findings are identified.  IMPRESSION: No active cardiopulmonary process.   Electronically Signed   By: Carey BullocksWilliam  Veazey M.D.   On: 06/02/2018 14:17  No results found for this or any previous visit (from the past 72 hour(s)). No results found.    Assessment and Plan: 58 y.o. female with   .Diagnoses and all orders for this visit:  Chronic depression -     buPROPion (WELLBUTRIN XL) 300 MG 24 hr tablet; Take 1 tablet (300 mg total) by mouth every morning.  Hypersomnolence -     buPROPion (WELLBUTRIN XL) 300 MG 24 hr tablet; Take 1 tablet (300 mg total) by mouth every morning.  Acute upper respiratory infection   Chronic Depression, Hypersomnia PHQ9=18, passive SI, no acute safety issues, patient verbally contracted for safety Cont Celexa 40 mg Increase Wellbutrin to 300 mg QD, counseled to self-monitor for increased irritability, palpitations, sleep disturbance and reduce back to 150 mg and contact me  if she does not tolerate higher dose  Acute URI Patient afebrile, well appearing on limited exam, no evidence of respiratory distress. Patient unable to provide BP or HR Negative self-reported SARS-CoV-2 PCR test last week Recommend she continue to quarantine and self-monitor Counseled on supportive care  Follow-up in 1 month for depression or sooner as needed if symptoms worsen or fail to improve   Follow Up Instructions:    I discussed the assessment and treatment plan with the patient. The patient was provided an opportunity to ask questions and all were answered. The patient agreed with the plan and demonstrated an understanding of the instructions.   The patient was advised to call back or seek an in-person evaluation if the symptoms worsen or if the condition fails to improve as anticipated.  I provided 25 minutes of non-face-to-face time during this encounter.   Carlis Stableharley [name], [place]PA-C

## 2018-11-29 ENCOUNTER — Other Ambulatory Visit: Payer: Self-pay | Admitting: Physician Assistant

## 2018-11-29 DIAGNOSIS — E782 Mixed hyperlipidemia: Secondary | ICD-10-CM

## 2018-11-29 DIAGNOSIS — J3089 Other allergic rhinitis: Secondary | ICD-10-CM

## 2018-11-29 DIAGNOSIS — K219 Gastro-esophageal reflux disease without esophagitis: Secondary | ICD-10-CM

## 2018-12-05 ENCOUNTER — Encounter: Payer: Self-pay | Admitting: Physician Assistant

## 2018-12-06 ENCOUNTER — Other Ambulatory Visit: Payer: Self-pay | Admitting: Physician Assistant

## 2018-12-06 DIAGNOSIS — Z8639 Personal history of other endocrine, nutritional and metabolic disease: Secondary | ICD-10-CM

## 2018-12-06 DIAGNOSIS — M8580 Other specified disorders of bone density and structure, unspecified site: Secondary | ICD-10-CM

## 2018-12-06 DIAGNOSIS — F332 Major depressive disorder, recurrent severe without psychotic features: Secondary | ICD-10-CM

## 2019-01-21 ENCOUNTER — Other Ambulatory Visit: Payer: Self-pay | Admitting: Physician Assistant

## 2019-01-21 ENCOUNTER — Ambulatory Visit (INDEPENDENT_AMBULATORY_CARE_PROVIDER_SITE_OTHER): Payer: BC Managed Care – PPO | Admitting: Family Medicine

## 2019-01-21 DIAGNOSIS — J329 Chronic sinusitis, unspecified: Secondary | ICD-10-CM | POA: Diagnosis not present

## 2019-01-21 DIAGNOSIS — Z8739 Personal history of other diseases of the musculoskeletal system and connective tissue: Secondary | ICD-10-CM

## 2019-01-21 DIAGNOSIS — E890 Postprocedural hypothyroidism: Secondary | ICD-10-CM

## 2019-01-21 DIAGNOSIS — G4710 Hypersomnia, unspecified: Secondary | ICD-10-CM | POA: Diagnosis not present

## 2019-01-21 DIAGNOSIS — Z8639 Personal history of other endocrine, nutritional and metabolic disease: Secondary | ICD-10-CM

## 2019-01-21 DIAGNOSIS — E782 Mixed hyperlipidemia: Secondary | ICD-10-CM | POA: Diagnosis not present

## 2019-01-21 DIAGNOSIS — F329 Major depressive disorder, single episode, unspecified: Secondary | ICD-10-CM

## 2019-01-21 DIAGNOSIS — F32A Depression, unspecified: Secondary | ICD-10-CM

## 2019-01-21 MED ORDER — AZELASTINE HCL 0.1 % NA SOLN: 2.0000 | Freq: Two times a day (BID) | NASAL | 12 refills | Status: DC

## 2019-01-21 MED ORDER — AZITHROMYCIN 250 MG PO TABS: 250.0000 mg | ORAL_TABLET | Freq: Every day | ORAL | 0 refills | Status: DC

## 2019-01-21 NOTE — Progress Notes (Signed)
Virtual Visit  via Video Note  I connected with      Engineer, site by a video enabled telemedicine application and verified that I am speaking with the correct person using two identifiers.   I discussed the limitations of evaluation and management by telemedicine and the availability of in person appointments. The patient expressed understanding and agreed to proceed.  History of Present Illness: Allison Reyes is a 58 y.o. female who would like to discuss 2-week history of nasal pressure congestion and discharge.  Patient notes a mild cough as well.  Notes hoarse voice. She is tried several different over-the-counter medications with little benefit.  Nasal discharge is thick and colored. Notes facial pain and pressure. Worse on left side. Had covid test about 1 month ago.   Patient also notes some fatigue which has been ongoing for years.  She has a history of hypothyroidism and hypoparathyroidism.  She takes levothyroxine and calcitriol.  She also has a history of hyperlipidemia.  She notes labs were checked about a year ago.   Observations/Objective: There were no vitals taken for this visit. Wt Readings from Last 5 Encounters:  07/13/18 243 lb (110.2 kg)  06/02/18 245 lb (111.1 kg)  12/17/17 246 lb (111.6 kg)  11/25/17 247 lb (112 kg)  11/11/17 248 lb (112.5 kg)   Exam: Appearance nontoxic appearing Slight hoarse voice quality.  No shortness of breath or tachypnea.  Able to complete sentences. Psych alert and oriented normal speech thought process and affect.    Assessment and Plan: 58 y.o. female with  Sinusitis.  Exacerbation of chronic sinusitis.  Patient has had success in the past with azithromycin.  Use azithromycin and Astelin nasal spray.  Recheck in near future if not improving. Proceed with outpatient COVID test if not improved.  Symptoms at this time are not quite consistent with COVID-19 and have been ongoing for 2 weeks.  Hypothyroidism hypoparathyroidism  hyperlipidemia chronic medical conditions.  We will go ahead and recheck basic fasting labs in near future.  Labs ordered to be done fasting.  PDMP not reviewed this encounter. Orders Placed This Encounter  Procedures  . CBC  . COMPLETE METABOLIC PANEL WITH GFR  . Lipid Panel w/reflex Direct LDL  . TSH  . PTH, Intact and Calcium  . VITAMIN D 25 Hydroxy (Vit-D Deficiency, Fractures)   Meds ordered this encounter  Medications  . azithromycin (ZITHROMAX) 250 MG tablet    Sig: Take 1 tablet (250 mg total) by mouth daily. Take first 2 tablets together, then 1 every day until finished.    Dispense:  6 tablet    Refill:  0  . azelastine (ASTELIN) 0.1 % nasal spray    Sig: Place 2 sprays into both nostrils 2 (two) times daily. Use in each nostril as directed    Dispense:  30 mL    Refill:  12    Follow Up Instructions:    I discussed the assessment and treatment plan with the patient. The patient was provided an opportunity to ask questions and all were answered. The patient agreed with the plan and demonstrated an understanding of the instructions.   The patient was advised to call back or seek an in-person evaluation if the symptoms worsen or if the condition fails to improve as anticipated.  Time: 15 minutes of intraservice time, with >22 minutes of total time during today's visit.      Historical information moved to improve visibility of documentation.  Past Medical  History:  Diagnosis Date  . Depression   . GERD (gastroesophageal reflux disease)   . Hyperlipidemia   . Hyperparathyroidism   . Osteopenia   . Stress incontinence    Past Surgical History:  Procedure Laterality Date  . ABDOMINAL HYSTERECTOMY     TAH - endometriosis  . LAPAROSCOPIC ENDOMETRIOSIS FULGURATION    . LAPAROTOMY    . LUMBAR LAMINECTOMY/DECOMPRESSION MICRODISCECTOMY Left 03/07/2017   Procedure: L4-5 Discectomy;  Surgeon: Ashok Pall, MD;  Location: [place];  Service: Neurosurgery;  Laterality:  Left;  . NASAL SINUS SURGERY  01/03/11  . OOPHORECTOMY    . THYROIDECTOMY, PARTIAL     Social History   Tobacco Use  . Smoking status: Light Tobacco Smoker    Types: Cigarettes  . Smokeless tobacco: Never Used  . Tobacco comment: 7 cigarettes daily.  Substance Use Topics  . Alcohol use: Yes    Comment: social-wine   family history includes Alcohol abuse in her mother; Coronary artery disease in her mother; Depression in her mother; Hypertension in her father and mother; Lung cancer in her father; Stroke in her paternal grandfather.  Medications: Current Outpatient Medications  Medication Sig Dispense Refill  . aspirin EC 81 MG tablet Take 1 tablet (81 mg total) by mouth daily. 90 tablet 3  . atorvastatin (LIPITOR) 40 MG tablet TAKE 1 TABLET BY MOUTH AT BEDTIME 90 tablet 1  . buPROPion (WELLBUTRIN XL) 300 MG 24 hr tablet Take 1 tablet (300 mg total) by mouth every morning. Due for follow up visit 30 tablet 0  . calcitRIOL (ROCALTROL) 0.5 MCG capsule TAKE 3 CAPSULES BY MOUTH DAILY 270 capsule 0  . citalopram (CELEXA) 40 MG tablet TAKE 1 TABLET BY MOUTH DAILY 90 tablet 0  . esomeprazole (NEXIUM) 20 MG capsule TAKE ONE CAPSULE BY MOUTH DAILY AS NEEDED 90 capsule 0  . estradiol (CLIMARA) 0.1 mg/24hr patch Place 1 patch (0.1 mg total) onto the skin once a week. 12 patch 6  . levocetirizine (XYZAL) 5 MG tablet TAKE 1 TABLET BY MOUTH EVERY EVENING 90 tablet 1  . levothyroxine (SYNTHROID, LEVOTHROID) 150 MCG tablet TAKE 1 TABLET(112 MCG) BY MOUTH DAILY BEFORE BREAKFAST 90 tablet 6  . mirabegron ER (MYRBETRIQ) 25 MG TB24 tablet Take 1 tablet (25 mg total) by mouth daily. 90 tablet 3  . montelukast (SINGULAIR) 10 MG tablet TAKE 1 TABLET BY MOUTH AT BEDTIME 90 tablet 1  . ROPINIRole HCl (REQUIP PO) Take by mouth daily as needed (RLS).    Marland Kitchen tiZANidine (ZANAFLEX) 4 MG tablet Take 1 tablet (4 mg total) every 6 (six) hours as needed by mouth for muscle spasms. 60 tablet 0  . zolpidem (AMBIEN) 10 MG  tablet Take 1 tablet (10 mg total) by mouth at bedtime as needed. 45 tablet 0  . azelastine (ASTELIN) 0.1 % nasal spray Place 2 sprays into both nostrils 2 (two) times daily. Use in each nostril as directed 30 mL 12  . azithromycin (ZITHROMAX) 250 MG tablet Take 1 tablet (250 mg total) by mouth daily. Take first 2 tablets together, then 1 every day until finished. 6 tablet 0  . cetirizine (ZYRTEC) 10 MG tablet Take 1 tablet (10 mg total) by mouth daily. (Patient not taking: Reported on 01/21/2019) 90 tablet 3   No current facility-administered medications for this visit.    No Known Allergies

## 2019-01-25 ENCOUNTER — Other Ambulatory Visit: Payer: Self-pay | Admitting: Physician Assistant

## 2019-01-25 DIAGNOSIS — E890 Postprocedural hypothyroidism: Secondary | ICD-10-CM

## 2019-02-17 ENCOUNTER — Other Ambulatory Visit: Payer: Self-pay | Admitting: Family Medicine

## 2019-02-17 DIAGNOSIS — G4710 Hypersomnia, unspecified: Secondary | ICD-10-CM

## 2019-02-17 DIAGNOSIS — F32A Depression, unspecified: Secondary | ICD-10-CM

## 2019-02-17 DIAGNOSIS — F329 Major depressive disorder, single episode, unspecified: Secondary | ICD-10-CM

## 2019-02-22 ENCOUNTER — Other Ambulatory Visit: Payer: Self-pay | Admitting: Obstetrics & Gynecology

## 2019-02-22 DIAGNOSIS — Z1231 Encounter for screening mammogram for malignant neoplasm of breast: Secondary | ICD-10-CM

## 2019-02-26 ENCOUNTER — Other Ambulatory Visit: Payer: Self-pay | Admitting: Physician Assistant

## 2019-02-26 ENCOUNTER — Encounter: Payer: Self-pay | Admitting: Physician Assistant

## 2019-02-26 ENCOUNTER — Ambulatory Visit (INDEPENDENT_AMBULATORY_CARE_PROVIDER_SITE_OTHER): Payer: BC Managed Care – PPO | Admitting: Physician Assistant

## 2019-02-26 VITALS — Ht 63.0 in | Wt 243.0 lb

## 2019-02-26 DIAGNOSIS — J3489 Other specified disorders of nose and nasal sinuses: Secondary | ICD-10-CM

## 2019-02-26 DIAGNOSIS — G478 Other sleep disorders: Secondary | ICD-10-CM

## 2019-02-26 DIAGNOSIS — J329 Chronic sinusitis, unspecified: Secondary | ICD-10-CM | POA: Diagnosis not present

## 2019-02-26 DIAGNOSIS — J309 Allergic rhinitis, unspecified: Secondary | ICD-10-CM

## 2019-02-26 DIAGNOSIS — R0683 Snoring: Secondary | ICD-10-CM | POA: Diagnosis not present

## 2019-02-26 DIAGNOSIS — K219 Gastro-esophageal reflux disease without esophagitis: Secondary | ICD-10-CM

## 2019-02-26 MED ORDER — DOXYCYCLINE HYCLATE 100 MG PO TABS: 100.0000 mg | ORAL_TABLET | Freq: Two times a day (BID) | ORAL | 0 refills | Status: DC

## 2019-02-26 MED ORDER — PREDNISONE 20 MG PO TABS: ORAL_TABLET | ORAL | 0 refills | Status: DC

## 2019-02-26 NOTE — Progress Notes (Signed)
Patient ID: Allison Reyes, female   DOB: 09-Oct-1960, 58 y.o.   MRN: [id number] .Marland KitchenVirtual Visit via Video Note  I connected with Allison Reyes on 03/01/19 at 10:10 AM EDT by a video enabled telemedicine application and verified that I am speaking with the correct person using two identifiers.  Location: Patient: home Provider: clinic   I discussed the limitations of evaluation and management by telemedicine and the availability of in person appointments. The patient expressed understanding and agreed to proceed.  History of Present Illness: Pt is a 58 yo female with hx of chronic sinuitis and allergies who calls into the clinic with "feeling bad for weeks". She does have a cough that is dry mostly but productive at times. She denies any fever. She takes tylenol cold sinus severe. She had a zpak which cleared symptoms for a little but then came right back. She denies any chills, body aches, or sweats. She has intermittent ST, dry nose, dry mouth and worsening migraines. She has been tested for COVID and negative. She feel unrested when she sleep. She admits to snoring. She feels like snoring and sleep worsening over last 4 years.   .. Active Ambulatory Problems    Diagnosis Date Noted  . Chronic depression 11/04/2008  . RESTLESS LEG SYNDROME 03/30/2010  . SKIN LESIONS, MULTIPLE 11/22/2008  . GERD 05/04/2010  . Weight gain 01/29/2011  . Hyperlipidemia 08/13/2011  . Stress incontinence 08/13/2011  . Encounter for monitoring statin therapy 05/19/2015  . Morbid obesity due to excess calories (HCC) 05/19/2015  . Severe episode of recurrent major depressive disorder, without psychotic features (HCC) 04/15/2016  . Post-operative hypothyroidism 04/15/2016  . Elevated alanine aminotransferase (ALT) level 04/17/2016  . History of hyperparathyroidism 11/06/2016  . Chronic allergic rhinitis 11/06/2016  . Vitamin D deficiency 11/06/2016  . 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 History  of osteopenia 11/06/2016  . At risk for obstructive sleep apnea 02/19/2017  . Snoring 02/19/2017  . Encounter for long-term (current) use of medications 02/19/2017  . Intractable low back pain 03/04/2017  . Back pain 03/05/2017  . HNP (herniated nucleus pulposus), lumbar 03/07/2017  . Change in voice 11/25/2017  . Chronic sinusitis 11/25/2017  . Osteopenia 11/25/2017  . Hypersomnolence 11/25/2017  . Urinary urgency 06/04/2018  . Urine leukocytes increased 06/04/2018  . Non-restorative sleep 03/01/2019   Resolved Ambulatory Problems    Diagnosis Date Noted  . Hypothyroidism 11/22/2008  . HYPERPARATHYROIDISM UNSPECIFIED 11/04/2008  . HYPERLIPIDEMIA 11/22/2008  . ANXIETY DEPRESSION 11/04/2008  . OTITIS MEDIA, LEFT 07/14/2009  . SINUSITIS - ACUTE-NOS 04/27/2009  . LUMBAR STRAIN 03/30/2010  . HOT FLASHES 05/04/2010  . OSTEOPENIA 05/04/2010  . Lumbar strain 01/29/2011  . Sinusitis, acute 07/05/2011  . Cough 08/13/2011  . Obesity (BMI 30-39.9) 08/13/2011  . UTI (urinary tract infection) 12/05/2011  . Acute bronchitis with bronchospasm 01/25/2013  . Allergic reaction 09/27/2014  . Folliculitis 09/27/2014  . Acute maxillary sinusitis 05/19/2015  . Chronic rhinitis 04/15/2016  . Hypothyroidism 03/05/2017   Past Medical History:  Diagnosis Date  . Depression   . GERD (gastroesophageal reflux disease)   . Hyperparathyroidism    Reviewed med, allergy, problem list.  Observations/Objective: No acute distress Dry cough.  No labored breathing.  Normal appearance and mood.   .. Today's Vitals   02/26/19 0931  Weight: 243 lb (110.2 kg)  Height: 5\' 3"  (1.6 m)   Body mass index is 43.05 kg/m.    Assessment and Plan: Marland KitchenDiagnoses and all orders for  this visit:  Chronic sinusitis, unspecified location -     doxycycline (VIBRA-TABS) 100 MG tablet; Take 1 tablet (100 mg total) by mouth 2 (two) times daily. -     predniSONE (DELTASONE) 20 MG tablet; Take 3 tablets for 3 days  then 2 tablets for 3 days, the 1 tablet for 3 days, then 1/2 tablet for 4 days.  Chronic allergic rhinitis -     doxycycline (VIBRA-TABS) 100 MG tablet; Take 1 tablet (100 mg total) by mouth 2 (two) times daily. -     predniSONE (DELTASONE) 20 MG tablet; Take 3 tablets for 3 days then 2 tablets for 3 days, the 1 tablet for 3 days, then 1/2 tablet for 4 days.  Snoring -     Home sleep test  Non-restorative sleep -     Home sleep test  Nose dryness   Appears like chronic sinusitis. Start doxycycline and added prednisone. HOld astelin could be making nose much dryer. Consider ayr and humidfer for nasal dryness.  May need CT or ENT referral.  Follow up in 2 weeks.   Concern for sleep apena with snoring/non restorative sleep/morbid obesity. Order placed for home sleep study.    Follow Up Instructions:      I discussed the assessment and treatment plan with the patient. The patient was provided an opportunity to ask questions and all were answered. The patient agreed with the plan and demonstrated an understanding of the instructions.   The patient was advised to call back or seek an in-person evaluation if the symptoms worsen or if the condition fails to improve as anticipated.   Iran Planas, PA-C

## 2019-02-26 NOTE — Progress Notes (Signed)
Felt bad for weeks: Cough (dry but sometimes productive) Dry nose Dry mouth Sore throat Migraines Sweats/chills  Was on Z-pack, helped but came right back Using Aleve cold and sinus Last temp 96.6 on Wednesday (doesn't have thermometer)

## 2019-03-01 ENCOUNTER — Encounter: Payer: Self-pay | Admitting: Physician Assistant

## 2019-03-01 DIAGNOSIS — G478 Other sleep disorders: Secondary | ICD-10-CM | POA: Insufficient documentation

## 2019-03-03 ENCOUNTER — Telehealth: Payer: Self-pay

## 2019-03-03 NOTE — Telephone Encounter (Signed)
Allison Reyes had a virtual visit last week. The pain in her face is gone. However, she now has numbness and tingling in her head, face and lips. She also reports some dizziness and insomnia. She did take Ambien for the insomnia and was able to sleep. She isn't sure if the new symptoms are from the medications she was prescribed. She states she does have the numbness and tingling in her face, head and lips with her calcium level is low. She is taking the Calcitriol 105 mg 2 tablets in the morning and 1 in the evening. She is going to the lab tomorrow to fasting labs that [place] ordered.   Should patient continue the medications? She wanted to know which is the likely reason for her new symptoms.

## 2019-03-03 NOTE — Telephone Encounter (Signed)
I think it is more likely the prednisone more than the antibiotic. How much prednisone do you have left we could try to taper a little faster. Ok to check calcium tomorrow.

## 2019-03-03 NOTE — Telephone Encounter (Signed)
Take 1/2 tablet for 4 days and then stop. I suspect symptoms will improve.

## 2019-03-03 NOTE — Telephone Encounter (Signed)
Patient states she has 5 tablets of the 20 mg.

## 2019-03-03 NOTE — Telephone Encounter (Signed)
Patient advised.

## 2019-03-04 DIAGNOSIS — Z8639 Personal history of other endocrine, nutritional and metabolic disease: Secondary | ICD-10-CM | POA: Diagnosis not present

## 2019-03-04 DIAGNOSIS — E890 Postprocedural hypothyroidism: Secondary | ICD-10-CM | POA: Diagnosis not present

## 2019-03-04 DIAGNOSIS — J329 Chronic sinusitis, unspecified: Secondary | ICD-10-CM | POA: Diagnosis not present

## 2019-03-04 DIAGNOSIS — E782 Mixed hyperlipidemia: Secondary | ICD-10-CM | POA: Diagnosis not present

## 2019-03-05 LAB — CBC
HCT: 41.2 % (ref 35.0–45.0)
Hemoglobin: 13.7 g/dL (ref 11.7–15.5)
MCH: 30.6 pg (ref 27.0–33.0)
MCHC: 33.3 g/dL (ref 32.0–36.0)
MCV: 92 fL (ref 80.0–100.0)
MPV: 9.8 fL (ref 7.5–12.5)
Platelets: 349 10*3/uL (ref 140–400)
RBC: 4.48 10*6/uL (ref 3.80–5.10)
RDW: 12.2 % (ref 11.0–15.0)
WBC: 10.1 10*3/uL (ref 3.8–10.8)

## 2019-03-05 LAB — COMPLETE METABOLIC PANEL WITH GFR
AG Ratio: 1.6 (calc) (ref 1.0–2.5)
ALT: 24 U/L (ref 6–29)
AST: 13 U/L (ref 10–35)
Albumin: 4.1 g/dL (ref 3.6–5.1)
Alkaline phosphatase (APISO): 99 U/L (ref 37–153)
BUN/Creatinine Ratio: 17 (calc) (ref 6–22)
BUN: 22 mg/dL (ref 7–25)
CO2: 31 mmol/L (ref 20–32)
Calcium: 9.6 mg/dL (ref 8.6–10.4)
Chloride: 101 mmol/L (ref 98–110)
Creat: 1.26 mg/dL — ABNORMAL HIGH (ref 0.50–1.05)
GFR, Est African American: 54 mL/min/{1.73_m2} — ABNORMAL LOW (ref >=60)
GFR, Est Non African American: 47 mL/min/{1.73_m2} — ABNORMAL LOW (ref >=60)
Globulin: 2.6 g/dL (calc) (ref 1.9–3.7)
Glucose, Bld: 95 mg/dL (ref 65–99)
Potassium: 4.3 mmol/L (ref 3.5–5.3)
Sodium: 140 mmol/L (ref 135–146)
Total Bilirubin: 1 mg/dL (ref 0.2–1.2)
Total Protein: 6.7 g/dL (ref 6.1–8.1)

## 2019-03-05 LAB — VITAMIN D 25 HYDROXY (VIT D DEFICIENCY, FRACTURES): Vit D, 25-Hydroxy: 27 ng/mL — ABNORMAL LOW (ref 30–100)

## 2019-03-05 LAB — PTH, INTACT AND CALCIUM
Calcium: 9.6 mg/dL (ref 8.6–10.4)
PTH: 24 pg/mL (ref 14–64)

## 2019-03-05 LAB — LIPID PANEL W/REFLEX DIRECT LDL
Cholesterol: 142 mg/dL (ref <200)
HDL: 60 mg/dL (ref >=50)
LDL Cholesterol (Calc): 61 mg/dL (calc)
Non-HDL Cholesterol (Calc): 82 mg/dL (calc) (ref <130)
Total CHOL/HDL Ratio: 2.4 (calc) (ref <5.0)
Triglycerides: 124 mg/dL (ref <150)

## 2019-03-05 LAB — TSH: TSH: 1.38 mIU/L (ref 0.40–4.50)

## 2019-03-06 ENCOUNTER — Other Ambulatory Visit: Payer: Self-pay | Admitting: Obstetrics & Gynecology

## 2019-03-06 ENCOUNTER — Other Ambulatory Visit: Payer: Self-pay | Admitting: Physician Assistant

## 2019-03-06 DIAGNOSIS — E890 Postprocedural hypothyroidism: Secondary | ICD-10-CM

## 2019-03-06 DIAGNOSIS — M8580 Other specified disorders of bone density and structure, unspecified site: Secondary | ICD-10-CM

## 2019-03-06 DIAGNOSIS — F332 Major depressive disorder, recurrent severe without psychotic features: Secondary | ICD-10-CM

## 2019-03-06 DIAGNOSIS — Z8639 Personal history of other endocrine, nutritional and metabolic disease: Secondary | ICD-10-CM

## 2019-03-11 ENCOUNTER — Ambulatory Visit: Payer: BC Managed Care – PPO

## 2019-03-12 ENCOUNTER — Ambulatory Visit (INDEPENDENT_AMBULATORY_CARE_PROVIDER_SITE_OTHER): Payer: BC Managed Care – PPO | Admitting: Osteopathic Medicine

## 2019-03-12 ENCOUNTER — Encounter: Payer: Self-pay | Admitting: Osteopathic Medicine

## 2019-03-12 VITALS — Temp 96.0°F | Wt 240.0 lb

## 2019-03-12 DIAGNOSIS — E782 Mixed hyperlipidemia: Secondary | ICD-10-CM

## 2019-03-12 DIAGNOSIS — Z8639 Personal history of other endocrine, nutritional and metabolic disease: Secondary | ICD-10-CM | POA: Diagnosis not present

## 2019-03-12 DIAGNOSIS — R7989 Other specified abnormal findings of blood chemistry: Secondary | ICD-10-CM | POA: Diagnosis not present

## 2019-03-12 DIAGNOSIS — J329 Chronic sinusitis, unspecified: Secondary | ICD-10-CM | POA: Diagnosis not present

## 2019-03-12 NOTE — Progress Notes (Signed)
Virtual Visit via Video (App used: Doximity) Note  I connected with      [name] on 03/12/19 at 10:48 AM by a telemedicine application and verified that I am speaking with the correct person using two identifiers.  Patient is at home I am in office   I discussed the limitations of evaluation and management by telemedicine and the availability of in person appointments. The patient expressed understanding and agreed to proceed.  History of Present Illness: Allison Reyes is a 58 y.o. female who would like to discuss sinus problems    Abx and steroids, as soon as she cam off this she developed sinus headache and migraine. Still coughing, sinus pressure and mucus is bothersome. Has had 2 rounds abx in past few mos for sinusitis          Observations/Objective: Temp (!) 96 F (35.6 C) (Oral)   Wt 240 lb (108.9 kg)   BMI 42.51 kg/m  BP Readings from Last 3 Encounters:  07/13/18 123/83  06/02/18 138/80  12/17/17 131/77   Exam: Normal Speech.  NAD  Lab and Radiology Results No results found for this or any previous visit (from the past 72 hour(s)). No results found.     Assessment and Plan: 58 y.o. female with The primary encounter diagnosis was Recurrent sinus infections. Diagnoses of History of hyperparathyroidism, Elevated serum creatinine, and Mixed hyperlipidemia were also pertinent to this visit.  Recurrent infections  Will hold off on abx for now (I offered, pt declined, I think this is reasonable until C results back)   PDMP not reviewed this encounter. Orders Placed This Encounter  Procedures  . CT Maxillofacial WO CM    Standing Status:   Future    Standing Expiration Date:   06/11/2020    Order Specific Question:   Is patient pregnant?    Answer:   Yes    Order Specific Question:   Preferred imaging location?    Answer:   [facility]    Order Specific Question:   Radiology Contrast Protocol - do NOT remove file path   Answer:   [email or web address]  . DG Bone Density    Order Specific Question:   Reason for Exam (SYMPTOM  OR DIAGNOSIS REQUIRED)    Answer:   hist hyperparathyroid    Order Specific Question:   Is the patient pregnant?    Answer:   No    Order Specific Question:   Preferred imaging location?    Answer:   [facility]  . COMPLETE METABOLIC PANEL WITH GFR  . LIPID SCREENING  . Ambulatory referral to ENT    Referral Priority:   Routine    Referral Type:   Consultation    Referral Reason:   Specialty Services Required    Requested Specialty:   Otolaryngology    Number of Visits Requested:   1   No orders of the defined types were placed in this encounter.  There are no Patient Instructions on file for this visit.  Instructions sent via MyChart. If MyChart not available, pt was given option for info via personal e-mail w/ no guarantee of protected health info over unsecured e-mail communication, and MyChart sign-up instructions were sent to patient.   Follow Up Instructions: Return for RECHECK SINUSES PENDING CT RESULTS / IF WORSE OR CHANGE.    I discussed the assessment and treatment plan with the patient. The patient was provided an opportunity to ask questions and all were answered. The patient  agreed with the plan and demonstrated an understanding of the instructions.   The patient was advised to call back or seek an in-person evaluation if any new concerns, if symptoms worsen or if the condition fails to improve as anticipated.  25 minutes of non-face-to-face time was provided during this encounter.      . . . . . . . . . . . . . Marland Kitchen                   Historical information moved to improve visibility of documentation.  Past Medical History:  Diagnosis Date  . Depression   . GERD (gastroesophageal reflux disease)   . Hyperlipidemia   . Hyperparathyroidism   . Osteopenia   . Stress incontinence    Past  Surgical History:  Procedure Laterality Date  . ABDOMINAL HYSTERECTOMY     TAH - endometriosis  . LAPAROSCOPIC ENDOMETRIOSIS FULGURATION    . LAPAROTOMY    . LUMBAR LAMINECTOMY/DECOMPRESSION MICRODISCECTOMY Left 03/07/2017   Procedure: L4-5 Discectomy;  Surgeon: Coletta Memos, MD;  Location: [facility] OR;  Service: Neurosurgery;  Laterality: Left;  . NASAL SINUS SURGERY  01/03/11  . OOPHORECTOMY    . THYROIDECTOMY, PARTIAL     Social History   Tobacco Use  . Smoking status: Former Smoker    Types: Cigarettes    Quit date: 07/27/2018    Years since quitting: 0.6  . Smokeless tobacco: Never Used  . Tobacco comment: 7 cigarettes daily.  Substance Use Topics  . Alcohol use: Yes    Comment: social-wine   family history includes Alcohol abuse in her mother; Coronary artery disease in her mother; Depression in her mother; Hypertension in her father and mother; Lung cancer in her father; Stroke in her paternal grandfather.  Medications: Current Outpatient Medications  Medication Sig Dispense Refill  . atorvastatin (LIPITOR) 40 MG tablet TAKE 1 TABLET BY MOUTH AT BEDTIME 90 tablet 1  . azelastine (ASTELIN) 0.1 % nasal spray Place 2 sprays into both nostrils 2 (two) times daily. Use in each nostril as directed 30 mL 12  . buPROPion (WELLBUTRIN XL) 300 MG 24 hr tablet TAKE 1 TABLET BY MOUTH EVERY MORNING( DUE FOR FOLLOW UP VISIT) 30 tablet 0  . calcitRIOL (ROCALTROL) 0.5 MCG capsule TAKE 3 CAPSULES BY MOUTH DAILY 270 capsule 0  . cetirizine (ZYRTEC) 10 MG tablet Take 1 tablet (10 mg total) by mouth daily. 90 tablet 3  . citalopram (CELEXA) 40 MG tablet TAKE 1 TABLET BY MOUTH DAILY 90 tablet 0  . esomeprazole (NEXIUM) 20 MG capsule TAKE ONE CAPSULE BY MOUTH DAILY AS NEEDED 90 capsule 1  . levocetirizine (XYZAL) 5 MG tablet TAKE 1 TABLET BY MOUTH EVERY EVENING 90 tablet 1  . levothyroxine (SYNTHROID) 150 MCG tablet TAKE 1 TABLET BY MOUTH DAILY BEFORE BREAKFAST 90 tablet 6  . mirabegron ER (MYRBETRIQ)  25 MG TB24 tablet Take 1 tablet (25 mg total) by mouth daily. 90 tablet 3  . ROPINIRole HCl (REQUIP PO) Take by mouth daily as needed (RLS).    Marland Kitchen tiZANidine (ZANAFLEX) 4 MG tablet Take 1 tablet (4 mg total) every 6 (six) hours as needed by mouth for muscle spasms. 60 tablet 0  . zolpidem (AMBIEN) 10 MG tablet Take 1 tablet (10 mg total) by mouth at bedtime as needed. 45 tablet 0   No current facility-administered medications for this visit.    No Known Allergies

## 2019-03-17 ENCOUNTER — Other Ambulatory Visit: Payer: Self-pay

## 2019-03-17 ENCOUNTER — Ambulatory Visit (INDEPENDENT_AMBULATORY_CARE_PROVIDER_SITE_OTHER): Payer: BC Managed Care – PPO

## 2019-03-17 DIAGNOSIS — J329 Chronic sinusitis, unspecified: Secondary | ICD-10-CM

## 2019-03-18 ENCOUNTER — Other Ambulatory Visit: Payer: Self-pay | Admitting: Family Medicine

## 2019-03-18 DIAGNOSIS — G4710 Hypersomnia, unspecified: Secondary | ICD-10-CM

## 2019-03-18 DIAGNOSIS — F32A Depression, unspecified: Secondary | ICD-10-CM

## 2019-03-18 DIAGNOSIS — F329 Major depressive disorder, single episode, unspecified: Secondary | ICD-10-CM

## 2019-03-22 ENCOUNTER — Telehealth: Payer: Self-pay

## 2019-03-22 NOTE — Telephone Encounter (Signed)
Can print and fax, routed to referral coordinator

## 2019-03-22 NOTE — Telephone Encounter (Signed)
Printed and faxed CT report to [facility] for patient - CF

## 2019-03-22 NOTE — Telephone Encounter (Signed)
Pt left a vm msg stating she has an appt with ENT on 03/23/19. As per pt, ENT office is requesting CT results to be faxed to [phone] for review. Pt mentioned that she was able to review the results with provider's note on MyChart. Pls advise, thanks.

## 2019-03-23 DIAGNOSIS — J323 Chronic sphenoidal sinusitis: Secondary | ICD-10-CM | POA: Diagnosis not present

## 2019-03-23 DIAGNOSIS — G44209 Tension-type headache, unspecified, not intractable: Secondary | ICD-10-CM | POA: Diagnosis not present

## 2019-03-23 DIAGNOSIS — G4733 Obstructive sleep apnea (adult) (pediatric): Secondary | ICD-10-CM | POA: Diagnosis not present

## 2019-04-08 ENCOUNTER — Ambulatory Visit: Payer: BC Managed Care – PPO

## 2019-04-09 DIAGNOSIS — R0683 Snoring: Secondary | ICD-10-CM | POA: Diagnosis not present

## 2019-04-09 DIAGNOSIS — G4719 Other hypersomnia: Secondary | ICD-10-CM | POA: Diagnosis not present

## 2019-04-13 ENCOUNTER — Encounter: Payer: Self-pay | Admitting: Osteopathic Medicine

## 2019-04-14 ENCOUNTER — Ambulatory Visit (INDEPENDENT_AMBULATORY_CARE_PROVIDER_SITE_OTHER): Payer: BC Managed Care – PPO

## 2019-04-14 ENCOUNTER — Other Ambulatory Visit: Payer: Self-pay

## 2019-04-14 DIAGNOSIS — Z1231 Encounter for screening mammogram for malignant neoplasm of breast: Secondary | ICD-10-CM

## 2019-04-14 DIAGNOSIS — Z8639 Personal history of other endocrine, nutritional and metabolic disease: Secondary | ICD-10-CM

## 2019-04-14 DIAGNOSIS — Z13820 Encounter for screening for osteoporosis: Secondary | ICD-10-CM | POA: Diagnosis not present

## 2019-04-14 DIAGNOSIS — Z780 Asymptomatic menopausal state: Secondary | ICD-10-CM | POA: Diagnosis not present

## 2019-04-20 ENCOUNTER — Other Ambulatory Visit: Payer: Self-pay

## 2019-04-20 DIAGNOSIS — F32A Depression, unspecified: Secondary | ICD-10-CM

## 2019-04-20 DIAGNOSIS — G4710 Hypersomnia, unspecified: Secondary | ICD-10-CM

## 2019-04-20 DIAGNOSIS — F329 Major depressive disorder, single episode, unspecified: Secondary | ICD-10-CM

## 2019-04-20 MED ORDER — BUPROPION HCL ER (XL) 300 MG PO TB24: ORAL_TABLET | ORAL | 1 refills | Status: DC

## 2019-04-28 ENCOUNTER — Telehealth: Payer: Self-pay

## 2019-04-28 ENCOUNTER — Encounter: Payer: Self-pay | Admitting: Osteopathic Medicine

## 2019-04-28 NOTE — Telephone Encounter (Signed)
Sent via MyChart:    COVID care at home - self-isolation, OTC treatment. :   Self isolation guidelines:   Based on Centers for Disease Control and Prevention (CDC) guidelines, he/she is required to stay home and self-monitor until the test results are returned.  1. If results are positive, the patient will need to stay at home and self-isolate until the follow conditions are met: a. at least 10 days since symptoms onset or date of positive test results b. AND 3 days fever free without antipyretics (Tylenol or Ibuprofen) c. AND improvement in respiratory symptoms.  Repeat testing is NOT recommended to "confirm" negative. You are OK to end isolation if the above conditions are met.       Over-the-Counter Medications & Home Remedies for Upper Respiratory Illness (COVID or otherwise)  Aches/Pains, Fever, Headache Acetaminophen (Tylenol) 500 mg tablets - take max 2 tablets (1000 mg) every 6 hours (4 times per day)  Ibuprofen (Motrin) 200 mg tablets - take max 4 tablets (800 mg) every 6 hours*  Sinus Congestion Nasal Saline if desired Oxymetolazone (Afrin, others) sparing use due to rebound congestion, NEVER use in kids Phenylephrine (Sudafed) 10 mg tablets every 4 hours (or the 12-hour formulation)* Diphenhydramine (Benadryl) 25 mg tablets - take max 2 tablets every 4 hours  Cough & Sore Throat Prescription cough pills or syrups as directed Dextromethorphan (Robitussin, others) - cough suppressant Guaifenesin (Robitussin, Mucinex, others) - expectorant (helps cough up mucus) (Dextromethorphan and Guaifenesin also come in a combination tablet) Lozenges w/ Benzocaine + Menthol (Cepacol) Honey - as much as you want! Teas which "coat the throat" - look for ingredients Elm Bark, Licorice Root, Marshmallow Root  Other Zinc Lozenges within 24 hours of symptoms onset - mixed evidence this shortens the duration of the common cold Don't waste your money on Vitamin C or  Echinacea  *Caution in patients with high blood pressure

## 2019-04-28 NOTE — Telephone Encounter (Signed)
Pt left a vm msg stating she had testing for Covid done and results was positive. As per pt, still experiencing fever with severe coughing. Wants to know if provider has any recommendations while she is recuperating from Covid. Pls advise, thanks.

## 2019-05-04 NOTE — Telephone Encounter (Signed)
Please schedule for virtual so I can assess the headache. Thanks!

## 2019-05-21 ENCOUNTER — Other Ambulatory Visit: Payer: Self-pay | Admitting: Neurology

## 2019-05-21 DIAGNOSIS — R3915 Urgency of urination: Secondary | ICD-10-CM

## 2019-05-21 MED ORDER — MIRABEGRON ER 25 MG PO TB24: 25.0000 mg | ORAL_TABLET | Freq: Every day | ORAL | 0 refills | Status: DC

## 2019-05-24 ENCOUNTER — Other Ambulatory Visit: Payer: Self-pay | Admitting: *Deleted

## 2019-05-24 DIAGNOSIS — E782 Mixed hyperlipidemia: Secondary | ICD-10-CM

## 2019-05-24 DIAGNOSIS — J3089 Other allergic rhinitis: Secondary | ICD-10-CM

## 2019-05-24 MED ORDER — ATORVASTATIN CALCIUM 40 MG PO TABS: 40.0000 mg | ORAL_TABLET | Freq: Every day | ORAL | 1 refills | Status: DC

## 2019-05-24 MED ORDER — MONTELUKAST SODIUM 10 MG PO TABS: 10.0000 mg | ORAL_TABLET | Freq: Every day | ORAL | 1 refills | Status: DC

## 2019-05-24 MED ORDER — LEVOCETIRIZINE DIHYDROCHLORIDE 5 MG PO TABS: 5.0000 mg | ORAL_TABLET | Freq: Every evening | ORAL | 1 refills | Status: DC

## 2019-06-10 ENCOUNTER — Other Ambulatory Visit: Payer: Self-pay

## 2019-06-10 DIAGNOSIS — Z8639 Personal history of other endocrine, nutritional and metabolic disease: Secondary | ICD-10-CM

## 2019-06-10 DIAGNOSIS — F332 Major depressive disorder, recurrent severe without psychotic features: Secondary | ICD-10-CM

## 2019-06-10 DIAGNOSIS — M8580 Other specified disorders of bone density and structure, unspecified site: Secondary | ICD-10-CM

## 2019-06-10 MED ORDER — CITALOPRAM HYDROBROMIDE 40 MG PO TABS: 40.0000 mg | ORAL_TABLET | Freq: Every day | ORAL | 0 refills | Status: DC

## 2019-06-10 MED ORDER — CALCITRIOL 0.5 MCG PO CAPS: 1.5000 ug | ORAL_CAPSULE | Freq: Every day | ORAL | 0 refills | Status: DC

## 2019-06-24 ENCOUNTER — Encounter: Payer: Self-pay | Admitting: Family Medicine

## 2019-06-24 ENCOUNTER — Telehealth (INDEPENDENT_AMBULATORY_CARE_PROVIDER_SITE_OTHER): Payer: BC Managed Care – PPO | Admitting: Family Medicine

## 2019-06-24 DIAGNOSIS — K219 Gastro-esophageal reflux disease without esophagitis: Secondary | ICD-10-CM

## 2019-06-24 DIAGNOSIS — F5105 Insomnia due to other mental disorder: Secondary | ICD-10-CM | POA: Diagnosis not present

## 2019-06-24 DIAGNOSIS — J309 Allergic rhinitis, unspecified: Secondary | ICD-10-CM

## 2019-06-24 MED ORDER — ZOLPIDEM TARTRATE 10 MG PO TABS: 10.0000 mg | ORAL_TABLET | Freq: Every evening | ORAL | 2 refills | Status: DC | PRN

## 2019-06-24 MED ORDER — TIZANIDINE HCL 4 MG PO TABS: 4.0000 mg | ORAL_TABLET | Freq: Four times a day (QID) | ORAL | 1 refills | Status: DC | PRN

## 2019-06-24 MED ORDER — ESOMEPRAZOLE MAGNESIUM 40 MG PO CPDR: 40.0000 mg | DELAYED_RELEASE_CAPSULE | Freq: Two times a day (BID) | ORAL | 3 refills | Status: DC

## 2019-06-24 NOTE — Assessment & Plan Note (Signed)
GERD is well controlled with nexium.  Continue at current dose.

## 2019-06-24 NOTE — Assessment & Plan Note (Signed)
Current symptoms of congestion with itching and burning sensation of eyes seem to be related to allergies.   She will restart antihistamine and flonase.  We discussed trying OTC antihistamine eyedrops as well that contain ketotifen.

## 2019-06-24 NOTE — Progress Notes (Signed)
Allison Reyes - 59 y.o. female MRN [id number]  Date of birth: 06-29-1960   This visit type was conducted due to national recommendations for restrictions regarding the COVID-19 Pandemic (e.g. social distancing).  This format is felt to be most appropriate for this patient at this time.  All issues noted in this document were discussed and addressed.  No physical exam was performed (except for noted visual exam findings with Video Visits).  I discussed the limitations of evaluation and management by telemedicine and the availability of in person appointments. The patient expressed understanding and agreed to proceed.  I connected with@ on 06/24/19 at  1:00 PM EST by a video enabled telemedicine application and verified that I am speaking with the correct person using two identifiers.  Present at visit: Everrett Coombe, DO [facility]  Patient Location: Home [address] [place] [address]   Provider location:   [facility]  Chief Complaint  Patient presents with  . URI    HPI  Allison Reyes is a 59 y.o. female who presents via audio/video conferencing for a telehealth visit today.  She has complaint of nasal congestion, itching/burning eyes, sore throat and mild dizziness.  Symptoms are mild and started a couple of days ago.  She has had problems with allergies in the past.  She reports having COVID at the end of December.  She denies fever, chills, headache, nausea, vomiting, diarrhea.   She is also requesting refills of nexium, tizanidine and ambien.  Chronic conditions are well controlled with current medications.      ROS:  A comprehensive ROS was completed and negative except as noted per HPI  Past Medical History:  Diagnosis Date  . Depression   . GERD (gastroesophageal reflux disease)   . Hyperlipidemia   . Hyperparathyroidism   . Osteopenia   . Stress incontinence     Past Surgical History:  Procedure Laterality Date  . ABDOMINAL HYSTERECTOMY     TAH -  endometriosis  . LAPAROSCOPIC ENDOMETRIOSIS FULGURATION    . LAPAROTOMY    . LUMBAR LAMINECTOMY/DECOMPRESSION MICRODISCECTOMY Left 03/07/2017   Procedure: L4-5 Discectomy;  Surgeon: Coletta Memos, MD;  Location: [facility] OR;  Service: Neurosurgery;  Laterality: Left;  . NASAL SINUS SURGERY  01/03/11  . OOPHORECTOMY    . THYROIDECTOMY, PARTIAL      Family History  Problem Relation Age of Onset  . Coronary artery disease Mother   . Hypertension Mother   . Alcohol abuse Mother   . Depression Mother   . Hypertension Father   . Lung cancer Father   . Stroke Paternal Grandfather   . Diabetes Neg Hx   . Colon cancer Neg Hx   . Breast cancer Neg Hx     Social History   Socioeconomic History  . Marital status: Married    Spouse name: Not on file  . Number of children: Not on file  . Years of education: Not on file  . Highest education level: Not on file  Occupational History  . Occupation: Conservation officer, historic buildings in [employer]  . Smoking status: Former Smoker    Types: Cigarettes    Quit date: 07/27/2018    Years since quitting: 0.9  . Smokeless tobacco: Never Used  . Tobacco comment: 7 cigarettes daily.  Substance and Sexual Activity  . Alcohol use: Yes    Comment: social-wine  . Drug use: No  . Sexual activity: Yes    Birth control/protection: Surgical  Other Topics Concern  .  Not on file  Social History Narrative   Lives w/ husband, daughter, son   Social Determinants of Health   Financial Resource Strain:   . Difficulty of Paying Living Expenses: Not on file  Food Insecurity:   . Worried About Programme researcher, broadcasting/film/video in the Last Year: Not on file  . Ran Out of Food in the Last Year: Not on file  Transportation Needs:   . Lack of Transportation (Medical): Not on file  . Lack of Transportation (Non-Medical): Not on file  Physical Activity:   . Days of Exercise per Week: Not on file  . Minutes of Exercise per Session: Not on file  Stress:   .  Feeling of Stress : Not on file  Social Connections:   . Frequency of Communication with Friends and Family: Not on file  . Frequency of Social Gatherings with Friends and Family: Not on file  . Attends Religious Services: Not on file  . Active Member of Clubs or Organizations: Not on file  . Attends Banker Meetings: Not on file  . Marital Status: Not on file  Intimate Partner Violence:   . Fear of Current or Ex-Partner: Not on file  . Emotionally Abused: Not on file  . Physically Abused: Not on file  . Sexually Abused: Not on file     Current Outpatient Medications:  .  buPROPion (WELLBUTRIN XL) 300 MG 24 hr tablet, TAKE 1 TABLET BY MOUTH EVERY MORNING, Disp: 90 tablet, Rfl: 1 .  calcitRIOL (ROCALTROL) 0.5 MCG capsule, Take 3 capsules (1.5 mcg total) by mouth daily., Disp: 270 capsule, Rfl: 0 .  cetirizine (ZYRTEC) 10 MG tablet, Take 1 tablet (10 mg total) by mouth daily., Disp: 90 tablet, Rfl: 3 .  citalopram (CELEXA) 40 MG tablet, Take 1 tablet (40 mg total) by mouth daily., Disp: 30 tablet, Rfl: 0 .  levothyroxine (SYNTHROID) 150 MCG tablet, TAKE 1 TABLET BY MOUTH DAILY BEFORE BREAKFAST, Disp: 90 tablet, Rfl: 6 .  mirabegron ER (MYRBETRIQ) 25 MG TB24 tablet, Take 1 tablet (25 mg total) by mouth daily. APPT FOR FURTHER REFILLS, Disp: 90 tablet, Rfl: 0 .  montelukast (SINGULAIR) 10 MG tablet, Take 1 tablet (10 mg total) by mouth at bedtime., Disp: 90 tablet, Rfl: 1 .  ROPINIRole HCl (REQUIP PO), Take by mouth daily as needed (RLS)., Disp: , Rfl:  .  tiZANidine (ZANAFLEX) 4 MG tablet, Take 1 tablet (4 mg total) by mouth every 6 (six) hours as needed for muscle spasms., Disp: 60 tablet, Rfl: 1 .  zolpidem (AMBIEN) 10 MG tablet, Take 1 tablet (10 mg total) by mouth at bedtime as needed., Disp: 45 tablet, Rfl: 2 .  atorvastatin (LIPITOR) 40 MG tablet, Take 1 tablet (40 mg total) by mouth at bedtime. (Patient not taking: Reported on 06/24/2019), Disp: 90 tablet, Rfl: 1 .   esomeprazole (NEXIUM) 40 MG capsule, Take 1 capsule (40 mg total) by mouth 2 (two) times daily before a meal., Disp: 60 capsule, Rfl: 3  EXAM:  VITALS per patient if applicable: BP (!) 143/77   Pulse 85   Temp 99.5 F (37.5 C)   Ht 5\' 3"  (1.6 m)   Wt 243 lb (110.2 kg)   BMI 43.05 kg/m   GENERAL: alert, oriented, appears well and in no acute distress  HEENT: atraumatic, conjunttiva clear, no obvious abnormalities on inspection of external nose and ears  NECK: normal movements of the head and neck  LUNGS: on inspection no signs of  respiratory distress, breathing rate appears normal, no obvious gross SOB, gasping or wheezing  CV: no obvious cyanosis  MS: moves all visible extremities without noticeable abnormality  PSYCH/NEURO: pleasant and cooperative, no obvious depression or anxiety, speech and thought processing grossly intact  ASSESSMENT AND PLAN:  Discussed the following assessment and plan:  Chronic allergic rhinitis Current symptoms of congestion with itching and burning sensation of eyes seem to be related to allergies.   She will restart antihistamine and flonase.  We discussed trying OTC antihistamine eyedrops as well that contain ketotifen.    Insomnia due to mental condition Stable with current dose of ambien.   Will refill for now.   GERD (gastroesophageal reflux disease) GERD is well controlled with nexium.  Continue at current dose.   Meds ordered this encounter  Medications  . tiZANidine (ZANAFLEX) 4 MG tablet    Sig: Take 1 tablet (4 mg total) by mouth every 6 (six) hours as needed for muscle spasms.    Dispense:  60 tablet    Refill:  1  . zolpidem (AMBIEN) 10 MG tablet    Sig: Take 1 tablet (10 mg total) by mouth at bedtime as needed.    Dispense:  45 tablet    Refill:  2  . esomeprazole (NEXIUM) 40 MG capsule    Sig: Take 1 capsule (40 mg total) by mouth 2 (two) times daily before a meal.    Dispense:  60 capsule    Refill:  3   30 minutes  spent including pre visit preparation, review of prior notes and labs, encounter with patient via video visit and same day documentation.    I discussed the assessment and treatment plan with the patient. The patient was provided an opportunity to ask questions and all were answered. The patient agreed with the plan and demonstrated an understanding of the instructions.   The patient was advised to call back or seek an in-person evaluation if the symptoms worsen or if the condition fails to improve as anticipated.    Luetta Nutting, DO

## 2019-06-24 NOTE — Assessment & Plan Note (Signed)
Stable with current dose of ambien.   Will refill for now.

## 2019-06-24 NOTE — Progress Notes (Signed)
Called patient to get information before the visit with patient and it went to VM. I left a message for the patient to call back.  Stuffy-runny nose, Itching and burning and eyes,  Sore throat Dizziness,  2 days. Had covid before christmas and is having brain fog.  Has not taken anything for the symptoms.   Zolpidem need new RX. (Back surgery) Tizanidine needs new RX.

## 2019-07-07 ENCOUNTER — Encounter: Payer: Self-pay | Admitting: Osteopathic Medicine

## 2019-07-13 ENCOUNTER — Other Ambulatory Visit: Payer: Self-pay

## 2019-07-13 DIAGNOSIS — F332 Major depressive disorder, recurrent severe without psychotic features: Secondary | ICD-10-CM

## 2019-07-13 MED ORDER — CITALOPRAM HYDROBROMIDE 40 MG PO TABS: 40.0000 mg | ORAL_TABLET | Freq: Every day | ORAL | 0 refills | Status: DC

## 2019-07-15 ENCOUNTER — Ambulatory Visit: Payer: BC Managed Care – PPO | Attending: Internal Medicine

## 2019-07-15 DIAGNOSIS — Z23 Encounter for immunization: Secondary | ICD-10-CM

## 2019-07-15 NOTE — Progress Notes (Signed)
   Covid-19 Vaccination Clinic  Name:  Allison Reyes    MRN: [id number] DOB: Nov 19, 1960  07/15/2019  [name] was observed post Covid-19 immunization for 15 minutes without incident. She was provided with Vaccine Information Sheet and instruction to access the V-Safe system.   [name] was instructed to call 911 with any severe reactions post vaccine: Marland Kitchen Difficulty breathing  . Swelling of face and throat  . A fast heartbeat  . A bad rash all over body  . Dizziness and weakness   Immunizations Administered    Name Date Dose VIS Date Route   Pfizer COVID-19 Vaccine 07/15/2019  8:59 AM 0.3 mL 04/09/2019 Intramuscular   Manufacturer: [employer], [employer]   Lot: [id number]   NDC: [address]-4

## 2019-07-28 ENCOUNTER — Encounter: Payer: Self-pay | Admitting: Osteopathic Medicine

## 2019-07-28 DIAGNOSIS — Z1211 Encounter for screening for malignant neoplasm of colon: Secondary | ICD-10-CM

## 2019-07-28 NOTE — Telephone Encounter (Signed)
Cologuard pended, OK to order?

## 2019-07-28 NOTE — Telephone Encounter (Signed)
Yes. Order signed.

## 2019-08-02 MED ORDER — TIZANIDINE HCL 4 MG PO TABS: 4.0000 mg | ORAL_TABLET | Freq: Four times a day (QID) | ORAL | 2 refills | Status: DC | PRN

## 2019-08-02 NOTE — Addendum Note (Signed)
Addended by: Arvilla Market on: 08/02/2019 09:27 AM   Modules accepted: Orders

## 2019-08-03 ENCOUNTER — Encounter: Payer: Self-pay | Admitting: Osteopathic Medicine

## 2019-08-04 DIAGNOSIS — E782 Mixed hyperlipidemia: Secondary | ICD-10-CM | POA: Diagnosis not present

## 2019-08-04 DIAGNOSIS — R7989 Other specified abnormal findings of blood chemistry: Secondary | ICD-10-CM | POA: Diagnosis not present

## 2019-08-04 LAB — COMPLETE METABOLIC PANEL WITH GFR
AG Ratio: 1.6 (calc) (ref 1.0–2.5)
ALT: 30 U/L — ABNORMAL HIGH (ref 6–29)
AST: 17 U/L (ref 10–35)
Albumin: 4.1 g/dL (ref 3.6–5.1)
Alkaline phosphatase (APISO): 78 U/L (ref 37–153)
BUN: 16 mg/dL (ref 7–25)
CO2: 29 mmol/L (ref 20–32)
Calcium: 9.7 mg/dL (ref 8.6–10.4)
Chloride: 102 mmol/L (ref 98–110)
Creat: 0.9 mg/dL (ref 0.50–1.05)
GFR, Est African American: 82 mL/min/{1.73_m2} (ref >=60)
GFR, Est Non African American: 70 mL/min/{1.73_m2} (ref >=60)
Globulin: 2.6 g/dL (calc) (ref 1.9–3.7)
Glucose, Bld: 106 mg/dL — ABNORMAL HIGH (ref 65–99)
Potassium: 4.2 mmol/L (ref 3.5–5.3)
Sodium: 140 mmol/L (ref 135–146)
Total Bilirubin: 0.4 mg/dL (ref 0.2–1.2)
Total Protein: 6.7 g/dL (ref 6.1–8.1)

## 2019-08-04 LAB — LIPID PANEL
Cholesterol: 215 mg/dL — ABNORMAL HIGH (ref <200)
HDL: 50 mg/dL (ref >=50)
LDL Cholesterol (Calc): 137 mg/dL (calc) — ABNORMAL HIGH
Non-HDL Cholesterol (Calc): 165 mg/dL (calc) — ABNORMAL HIGH (ref <130)
Total CHOL/HDL Ratio: 4.3 (calc) (ref <5.0)
Triglycerides: 150 mg/dL — ABNORMAL HIGH (ref <150)

## 2019-08-07 ENCOUNTER — Encounter: Payer: Self-pay | Admitting: Osteopathic Medicine

## 2019-08-09 ENCOUNTER — Ambulatory Visit: Payer: BC Managed Care – PPO | Attending: Internal Medicine

## 2019-08-09 DIAGNOSIS — Z23 Encounter for immunization: Secondary | ICD-10-CM

## 2019-08-09 NOTE — Progress Notes (Signed)
   Covid-19 Vaccination Clinic  Name:  Allison Reyes    MRN: [id number] DOB: 08-16-1960  08/09/2019  Ms. [name] was observed post Covid-19 immunization for 15 minutes without incident. She was provided with Vaccine Information Sheet and instruction to access the V-Safe system.   Ms. [name] was instructed to call 911 with any severe reactions post vaccine: Marland Kitchen Difficulty breathing  . Swelling of face and throat  . A fast heartbeat  . A bad rash all over body  . Dizziness and weakness   Immunizations Administered    Name Date Dose VIS Date Route   Pfizer COVID-19 Vaccine 08/09/2019  8:34 AM 0.3 mL 04/09/2019 Intramuscular   Manufacturer: [employer], [employer]   Lot: [id number]   NDC: [address]-4

## 2019-08-10 ENCOUNTER — Other Ambulatory Visit: Payer: Self-pay

## 2019-08-10 DIAGNOSIS — E782 Mixed hyperlipidemia: Secondary | ICD-10-CM

## 2019-08-10 MED ORDER — ATORVASTATIN CALCIUM 40 MG PO TABS: 40.0000 mg | ORAL_TABLET | Freq: Every day | ORAL | 1 refills | Status: DC

## 2019-08-11 ENCOUNTER — Encounter: Payer: Self-pay | Admitting: Osteopathic Medicine

## 2019-08-22 ENCOUNTER — Other Ambulatory Visit: Payer: Self-pay | Admitting: Physician Assistant

## 2019-08-22 DIAGNOSIS — R3915 Urgency of urination: Secondary | ICD-10-CM

## 2019-09-05 DIAGNOSIS — Z1211 Encounter for screening for malignant neoplasm of colon: Secondary | ICD-10-CM | POA: Diagnosis not present

## 2019-09-11 LAB — COLOGUARD
COLOGUARD: NEGATIVE
Cologuard: NEGATIVE
Cologuard: NEGATIVE

## 2019-09-13 ENCOUNTER — Other Ambulatory Visit: Payer: Self-pay

## 2019-09-13 DIAGNOSIS — Z8639 Personal history of other endocrine, nutritional and metabolic disease: Secondary | ICD-10-CM

## 2019-09-13 DIAGNOSIS — M8580 Other specified disorders of bone density and structure, unspecified site: Secondary | ICD-10-CM

## 2019-09-13 MED ORDER — CALCITRIOL 0.5 MCG PO CAPS: 1.5000 ug | ORAL_CAPSULE | Freq: Every day | ORAL | 0 refills | Status: DC

## 2019-09-14 ENCOUNTER — Encounter: Payer: Self-pay | Admitting: Osteopathic Medicine

## 2019-09-23 ENCOUNTER — Telehealth: Payer: Self-pay

## 2019-09-23 ENCOUNTER — Ambulatory Visit: Payer: BC Managed Care – PPO | Admitting: Nurse Practitioner

## 2019-09-23 NOTE — Telephone Encounter (Signed)
Noted  

## 2019-09-23 NOTE — Telephone Encounter (Signed)
Patient called with some complaints of a "tingling" feeling in her head along with facial, eyes, lips, and hand numbness. Reports these symptoms have been going on for 2 days now and are worsening. Patient feels dizzy and has been pushing fluids. Reports something similar has happened previously when her calcium was very low, but these symptoms seem to be worse. Her blood pressures have been running around 140s over 80s or 90s and usually her blood pressure run very low.   Patient requested to be seen here, but I advised her that she needs to be evaluated at ER. Patient agreeable with plan. Declines [facility] ER because she said she had a bad experience but wanted recommendation on other local ERs.. I gave her address for [facility] here in [place] and she was agreeable to go now for evaluation.   FYI to PCP

## 2019-09-28 ENCOUNTER — Encounter: Payer: Self-pay | Admitting: Osteopathic Medicine

## 2019-09-28 ENCOUNTER — Other Ambulatory Visit: Payer: Self-pay

## 2019-09-28 DIAGNOSIS — R3915 Urgency of urination: Secondary | ICD-10-CM

## 2019-09-28 DIAGNOSIS — F332 Major depressive disorder, recurrent severe without psychotic features: Secondary | ICD-10-CM

## 2019-09-28 MED ORDER — CITALOPRAM HYDROBROMIDE 40 MG PO TABS: 40.0000 mg | ORAL_TABLET | Freq: Every day | ORAL | 0 refills | Status: DC

## 2019-09-28 MED ORDER — MIRABEGRON ER 25 MG PO TB24: 25.0000 mg | ORAL_TABLET | Freq: Every day | ORAL | 0 refills | Status: DC

## 2019-10-25 ENCOUNTER — Other Ambulatory Visit: Payer: Self-pay

## 2019-10-25 DIAGNOSIS — G4710 Hypersomnia, unspecified: Secondary | ICD-10-CM

## 2019-10-25 DIAGNOSIS — F32A Depression, unspecified: Secondary | ICD-10-CM

## 2019-10-25 MED ORDER — BUPROPION HCL ER (XL) 300 MG PO TB24: ORAL_TABLET | ORAL | 0 refills | Status: DC

## 2019-10-26 ENCOUNTER — Other Ambulatory Visit: Payer: Self-pay

## 2019-10-26 MED ORDER — ESOMEPRAZOLE MAGNESIUM 40 MG PO CPDR: 40.0000 mg | DELAYED_RELEASE_CAPSULE | Freq: Two times a day (BID) | ORAL | 3 refills | Status: DC

## 2019-11-03 ENCOUNTER — Other Ambulatory Visit: Payer: Self-pay

## 2019-11-03 ENCOUNTER — Encounter: Payer: Self-pay | Admitting: Osteopathic Medicine

## 2019-11-03 ENCOUNTER — Ambulatory Visit (INDEPENDENT_AMBULATORY_CARE_PROVIDER_SITE_OTHER): Payer: BC Managed Care – PPO | Admitting: Osteopathic Medicine

## 2019-11-03 VITALS — BP 120/77 | HR 85 | Temp 98.0°F | Ht 62.0 in | Wt 246.0 lb

## 2019-11-03 DIAGNOSIS — N393 Stress incontinence (female) (male): Secondary | ICD-10-CM

## 2019-11-03 DIAGNOSIS — M8580 Other specified disorders of bone density and structure, unspecified site: Secondary | ICD-10-CM

## 2019-11-03 DIAGNOSIS — Z8639 Personal history of other endocrine, nutritional and metabolic disease: Secondary | ICD-10-CM

## 2019-11-03 DIAGNOSIS — F5105 Insomnia due to other mental disorder: Secondary | ICD-10-CM | POA: Diagnosis not present

## 2019-11-03 DIAGNOSIS — F329 Major depressive disorder, single episode, unspecified: Secondary | ICD-10-CM

## 2019-11-03 DIAGNOSIS — F32A Depression, unspecified: Secondary | ICD-10-CM

## 2019-11-03 DIAGNOSIS — R3915 Urgency of urination: Secondary | ICD-10-CM

## 2019-11-03 DIAGNOSIS — Z8616 Personal history of COVID-19: Secondary | ICD-10-CM | POA: Diagnosis not present

## 2019-11-03 DIAGNOSIS — F331 Major depressive disorder, recurrent, moderate: Secondary | ICD-10-CM

## 2019-11-03 DIAGNOSIS — E782 Mixed hyperlipidemia: Secondary | ICD-10-CM

## 2019-11-03 DIAGNOSIS — J302 Other seasonal allergic rhinitis: Secondary | ICD-10-CM

## 2019-11-03 DIAGNOSIS — E890 Postprocedural hypothyroidism: Secondary | ICD-10-CM

## 2019-11-03 DIAGNOSIS — R4189 Other symptoms and signs involving cognitive functions and awareness: Secondary | ICD-10-CM

## 2019-11-03 DIAGNOSIS — F488 Other specified nonpsychotic mental disorders: Secondary | ICD-10-CM

## 2019-11-03 DIAGNOSIS — K219 Gastro-esophageal reflux disease without esophagitis: Secondary | ICD-10-CM

## 2019-11-03 DIAGNOSIS — J309 Allergic rhinitis, unspecified: Secondary | ICD-10-CM

## 2019-11-03 MED ORDER — ZOLPIDEM TARTRATE 10 MG PO TABS: 10.0000 mg | ORAL_TABLET | Freq: Every evening | ORAL | 0 refills | Status: DC | PRN

## 2019-11-03 MED ORDER — LEVOTHYROXINE SODIUM 150 MCG PO TABS: ORAL_TABLET | ORAL | 3 refills | Status: DC

## 2019-11-03 MED ORDER — CITALOPRAM HYDROBROMIDE 40 MG PO TABS: 40.0000 mg | ORAL_TABLET | Freq: Every day | ORAL | 3 refills | Status: DC

## 2019-11-03 MED ORDER — ROPINIROLE HCL 1 MG PO TABS: 1.0000 mg | ORAL_TABLET | Freq: Every day | ORAL | 3 refills | Status: DC

## 2019-11-03 MED ORDER — ESOMEPRAZOLE MAGNESIUM 40 MG PO CPDR: 40.0000 mg | DELAYED_RELEASE_CAPSULE | Freq: Two times a day (BID) | ORAL | 3 refills | Status: DC

## 2019-11-03 MED ORDER — ATORVASTATIN CALCIUM 40 MG PO TABS: 40.0000 mg | ORAL_TABLET | Freq: Every day | ORAL | 3 refills | Status: DC

## 2019-11-03 MED ORDER — CALCITRIOL 0.5 MCG PO CAPS: 1.5000 ug | ORAL_CAPSULE | Freq: Every day | ORAL | 3 refills | Status: AC

## 2019-11-03 MED ORDER — LEVOCETIRIZINE DIHYDROCHLORIDE 5 MG PO TABS: 5.0000 mg | ORAL_TABLET | Freq: Every day | ORAL | 3 refills | Status: DC

## 2019-11-03 MED ORDER — MONTELUKAST SODIUM 10 MG PO TABS: 10.0000 mg | ORAL_TABLET | Freq: Every day | ORAL | 3 refills | Status: DC

## 2019-11-03 MED ORDER — BUPROPION HCL ER (XL) 450 MG PO TB24: ORAL_TABLET | ORAL | 3 refills | Status: DC

## 2019-11-03 MED ORDER — TIZANIDINE HCL 4 MG PO TABS: 4.0000 mg | ORAL_TABLET | Freq: Four times a day (QID) | ORAL | 2 refills | Status: DC | PRN

## 2019-11-03 MED ORDER — MIRABEGRON ER 25 MG PO TB24: 25.0000 mg | ORAL_TABLET | Freq: Every day | ORAL | 3 refills | Status: DC

## 2019-11-03 NOTE — Progress Notes (Signed)
Allison Reyes is a 59 y.o. female who presents to  [facility] at [facility]  today, 11/03/19, seeking care for the following:  . Rx refills - see below  . Mental health - struggling with motivation, feeling down gaining weight . COVID infection 03/2019 w/ residual brain fog      ASSESSMENT & PLAN with other pertinent findings:  The primary encounter diagnosis was Brain fog. Diagnoses of History of COVID-19, Mixed hyperlipidemia, Insomnia due to mental condition, Gastroesophageal reflux disease, unspecified whether esophagitis present, History of hyperparathyroidism, Seasonal allergies, Stress incontinence of urine, Chronic depression, Osteopenia, unspecified location, Chronic allergic rhinitis, Moderate episode of recurrent major depressive disorder (HCC), Postsurgical hypothyroidism, and Urinary urgency were also pertinent to this visit.   No results found for this or any previous visit (from the past 24 hour(s)).  --> increasing Wellbutrin hopefully will help with moods as well as weight. Optimize diet/exercise as well   The 10-year ASCVD risk score Denman George DC Jr., et al., 2013) is: 6.4%   Values used to calculate the score:     Age: 44 years     Sex: Female     Is Non-Hispanic African American: No     Diabetic: No     Tobacco smoker: Yes     Systolic Blood Pressure: 120 mmHg     Is BP treated: No     HDL Cholesterol: 50 mg/dL     Total Cholesterol: 215 mg/dL   There are no Patient Instructions on file for this visit.  Orders Placed This Encounter  Procedures  . CBC  . COMPLETE METABOLIC PANEL WITH GFR  . Lipid panel  . TSH  . VITAMIN D 25 Hydroxy (Vit-D Deficiency, Fractures)    Meds ordered this encounter  Medications  . atorvastatin (LIPITOR) 40 MG tablet    Sig: Take 1 tablet (40 mg total) by mouth at bedtime.    Dispense:  90 tablet    Refill:  3  . buPROPion 450 MG TB24    Sig: TAKE 1 TABLET BY MOUTH EVERY MORNING     Dispense:  90 tablet    Refill:  3    If 450 mg not covered, can disp alternative 300 mg po daily #90 refill x3 plus 150 mg po daily #90 refill x3  . calcitRIOL (ROCALTROL) 0.5 MCG capsule    Sig: Take 3 capsules (1.5 mcg total) by mouth daily.    Dispense:  270 capsule    Refill:  3    No refills. Needs f/u appt w/PCP.  Marland Kitchen citalopram (CELEXA) 40 MG tablet    Sig: Take 1 tablet (40 mg total) by mouth daily.    Dispense:  90 tablet    Refill:  3  . esomeprazole (NEXIUM) 40 MG capsule    Sig: Take 1 capsule (40 mg total) by mouth 2 (two) times daily before a meal.    Dispense:  180 capsule    Refill:  3  . levothyroxine (SYNTHROID) 150 MCG tablet    Sig: TAKE 1 TABLET BY MOUTH DAILY BEFORE BREAKFAST    Dispense:  90 tablet    Refill:  3  . mirabegron ER (MYRBETRIQ) 25 MG TB24 tablet    Sig: Take 1 tablet (25 mg total) by mouth daily.    Dispense:  90 tablet    Refill:  3    Pt will activate savings card  . montelukast (SINGULAIR) 10 MG tablet    Sig:  Take 1 tablet (10 mg total) by mouth at bedtime.    Dispense:  90 tablet    Refill:  3  . tiZANidine (ZANAFLEX) 4 MG tablet    Sig: Take 1 tablet (4 mg total) by mouth every 6 (six) hours as needed for muscle spasms.    Dispense:  45 tablet    Refill:  2  . zolpidem (AMBIEN) 10 MG tablet    Sig: Take 1 tablet (10 mg total) by mouth at bedtime as needed.    Dispense:  45 tablet    Refill:  0  . levocetirizine (XYZAL) 5 MG tablet    Sig: Take 1 tablet (5 mg total) by mouth at bedtime.    Dispense:  90 tablet    Refill:  3  . rOPINIRole (REQUIP) 1 MG tablet    Sig: Take 1 tablet (1 mg total) by mouth at bedtime.    Dispense:  90 tablet    Refill:  3       Follow-up instructions: Return in about 4 weeks (around 12/01/2019) for VIRTUAL VISIT or IN-OFFICE VISIT follow up on change in Wellbutrin, see me sooner if needed.                                         There were no vitals taken  for this visit.  No outpatient medications have been marked as taking for the 11/03/19 encounter (Appointment) with Sunnie Nielsen, DO.    No results found for this or any previous visit (from the past 72 hour(s)).  No results found.     All questions at time of visit were answered - patient instructed to contact office with any additional concerns or updates.  ER/RTC precautions were reviewed with the patient as applicable.   Please note: voice recognition software was used to produce this document, and typos may escape review. Please contact Dr. Lyn Hollingshead for any needed clarifications.   Total encounter time: 30 minutes.

## 2019-12-01 ENCOUNTER — Telehealth: Payer: BC Managed Care – PPO | Admitting: Osteopathic Medicine

## 2019-12-03 ENCOUNTER — Other Ambulatory Visit: Payer: Self-pay | Admitting: Osteopathic Medicine

## 2019-12-05 ENCOUNTER — Other Ambulatory Visit: Payer: Self-pay | Admitting: Osteopathic Medicine

## 2019-12-15 ENCOUNTER — Telehealth: Payer: BC Managed Care – PPO | Admitting: Osteopathic Medicine

## 2019-12-21 ENCOUNTER — Telehealth (INDEPENDENT_AMBULATORY_CARE_PROVIDER_SITE_OTHER): Payer: BC Managed Care – PPO | Admitting: Osteopathic Medicine

## 2019-12-21 ENCOUNTER — Encounter: Payer: Self-pay | Admitting: Osteopathic Medicine

## 2019-12-21 VITALS — Wt 242.0 lb

## 2019-12-21 DIAGNOSIS — Z8616 Personal history of COVID-19: Secondary | ICD-10-CM

## 2019-12-21 DIAGNOSIS — J329 Chronic sinusitis, unspecified: Secondary | ICD-10-CM | POA: Diagnosis not present

## 2019-12-21 DIAGNOSIS — F32A Depression, unspecified: Secondary | ICD-10-CM

## 2019-12-21 DIAGNOSIS — F329 Major depressive disorder, single episode, unspecified: Secondary | ICD-10-CM | POA: Diagnosis not present

## 2019-12-21 DIAGNOSIS — F488 Other specified nonpsychotic mental disorders: Secondary | ICD-10-CM | POA: Diagnosis not present

## 2019-12-21 DIAGNOSIS — R4189 Other symptoms and signs involving cognitive functions and awareness: Secondary | ICD-10-CM

## 2019-12-21 MED ORDER — AMOXICILLIN-POT CLAVULANATE 875-125 MG PO TABS
1.0000 | ORAL_TABLET | Freq: Two times a day (BID) | ORAL | 0 refills | Status: AC
Start: 2019-12-21 — End: 2019-12-28

## 2019-12-21 MED ORDER — IPRATROPIUM BROMIDE 0.06 % NA SOLN: 2.0000 | Freq: Four times a day (QID) | NASAL | 1 refills | Status: DC

## 2019-12-21 NOTE — Progress Notes (Signed)
Virtual Visit via Video Note  I connected with      Allison Reyes on 12/21/19 at 10:28 AM  by a telemedicine application and verified that I am speaking with the correct person using two identifiers.  Patient is in separate location  I am in office   I discussed the limitations of evaluation and management by telemedicine and the availability of in person appointments. The patient expressed understanding and agreed to proceed.  History of Present Illness: Allison Reyes is a 59 y.o. female who would like to discuss follow up on brain fog - last visit 11/03/2019 we increased Wellbutrin from 300 mg daily to 450 mg daily, hoping this would help w/ brain fog which has been residual since COVID infection 03/2019.    Mood feels better. Brain fog   Sinus pressure and hoarseness, jaw pain, ongoing a few weeks. Has tried OTC medications without relief.     Observations/Objective: There were no vitals taken for this visit. BP Readings from Last 3 Encounters:  11/03/19 120/77  06/24/19 (!) 143/77  07/13/18 123/83   Wt Readings from Last 3 Encounters:  12/21/19 242 lb (109.8 kg)  11/03/19 246 lb (111.6 kg)  06/24/19 243 lb (110.2 kg)    Exam: Normal Speech.  NAD  Lab and Radiology Results No results found for this or any previous visit (from the past 72 hour(s)). No results found.     Assessment and Plan: 59 y.o. female with The primary encounter diagnosis was Sinusitis, unspecified chronicity, unspecified location. Diagnoses of History of COVID-19, Brain fog, and Chronic depression were also pertinent to this visit.  Mental health better - continue Welbutrin 450 mg daily Brain fog (post-COVID) no significant change Abx and nasal spray for sinusitis, call if no better  Meds ordered this encounter  Medications  . amoxicillin-clavulanate (AUGMENTIN) 875-125 MG tablet    Sig: Take 1 tablet by mouth 2 (two) times daily for 7 days.    Dispense:  14 tablet    Refill:   0  . ipratropium (ATROVENT) 0.06 % nasal spray    Sig: Place 2 sprays into both nostrils 4 (four) times daily.    Dispense:  15 mL    Refill:  1    Follow Up Instructions: Return for ANNUAL CHECK-UP LATER 02/2020 OR EARLY 03/2020.    I discussed the assessment and treatment plan with the patient. The patient was provided an opportunity to ask questions and all were answered. The patient agreed with the plan and demonstrated an understanding of the instructions.   The patient was advised to call back or seek an in-person evaluation if any new concerns, if symptoms worsen or if the condition fails to improve as anticipated.  25 minutes of non-face-to-face time was provided during this encounter.      . . . . . . . . . . . . . Marland Kitchen                   Historical information moved to improve visibility of documentation.  Past Medical History:  Diagnosis Date  . Depression   . GERD (gastroesophageal reflux disease)   . Hyperlipidemia   . Hyperparathyroidism   . Osteopenia   . Stress incontinence    Past Surgical History:  Procedure Laterality Date  . ABDOMINAL HYSTERECTOMY     TAH - endometriosis  . LAPAROSCOPIC ENDOMETRIOSIS FULGURATION    . LAPAROTOMY    . LUMBAR LAMINECTOMY/DECOMPRESSION MICRODISCECTOMY Left 03/07/2017  Procedure: L4-5 Discectomy;  Surgeon: Coletta Memos, MD;  Location: [facility] OR;  Service: Neurosurgery;  Laterality: Left;  . NASAL SINUS SURGERY  01/03/11  . OOPHORECTOMY    . THYROIDECTOMY, PARTIAL     Social History   Tobacco Use  . Smoking status: Former Smoker    Types: Cigarettes    Quit date: 07/27/2018    Years since quitting: 1.4  . Smokeless tobacco: Never Used  . Tobacco comment: 7 cigarettes daily.  Substance Use Topics  . Alcohol use: Yes    Comment: social-wine   family history includes Alcohol abuse in her mother; Coronary artery disease in her mother; Depression in her mother; Hypertension in her father and  mother; Lung cancer in her father; Stroke in her paternal grandfather.  Medications: Current Outpatient Medications  Medication Sig Dispense Refill  . atorvastatin (LIPITOR) 40 MG tablet Take 1 tablet (40 mg total) by mouth at bedtime. 90 tablet 3  . buPROPion 450 MG TB24 TAKE 1 TABLET BY MOUTH EVERY MORNING 90 tablet 3  . calcitRIOL (ROCALTROL) 0.5 MCG capsule Take 3 capsules (1.5 mcg total) by mouth daily. 270 capsule 3  . cetirizine (ZYRTEC) 10 MG tablet Take 1 tablet (10 mg total) by mouth daily. 90 tablet 3  . citalopram (CELEXA) 40 MG tablet Take 1 tablet (40 mg total) by mouth daily. 90 tablet 3  . esomeprazole (NEXIUM) 40 MG capsule Take 1 capsule (40 mg total) by mouth 2 (two) times daily before a meal. 180 capsule 3  . levocetirizine (XYZAL) 5 MG tablet Take 1 tablet (5 mg total) by mouth at bedtime. 90 tablet 3  . levothyroxine (SYNTHROID) 150 MCG tablet TAKE 1 TABLET BY MOUTH DAILY BEFORE BREAKFAST 90 tablet 3  . mirabegron ER (MYRBETRIQ) 25 MG TB24 tablet Take 1 tablet (25 mg total) by mouth daily. 90 tablet 3  . montelukast (SINGULAIR) 10 MG tablet TAKE 1 TABLET(10 MG) BY MOUTH AT BEDTIME 90 tablet 3  . rOPINIRole (REQUIP) 1 MG tablet Take 1 tablet (1 mg total) by mouth at bedtime. 90 tablet 3  . tiZANidine (ZANAFLEX) 4 MG tablet Take 1 tablet (4 mg total) by mouth every 6 (six) hours as needed for muscle spasms. 45 tablet 2  . zolpidem (AMBIEN) 10 MG tablet Take 1 tablet (10 mg total) by mouth at bedtime as needed. 45 tablet 0   No current facility-administered medications for this visit.   No Known Allergies

## 2020-01-11 ENCOUNTER — Other Ambulatory Visit: Payer: Self-pay | Admitting: Osteopathic Medicine

## 2020-05-30 ENCOUNTER — Encounter: Payer: Self-pay | Admitting: Osteopathic Medicine

## 2020-05-30 ENCOUNTER — Other Ambulatory Visit: Payer: Self-pay

## 2020-05-30 ENCOUNTER — Ambulatory Visit (INDEPENDENT_AMBULATORY_CARE_PROVIDER_SITE_OTHER): Payer: BC Managed Care – PPO | Admitting: Osteopathic Medicine

## 2020-05-30 VITALS — BP 125/75 | HR 74 | Temp 98.5°F | Wt 252.1 lb

## 2020-05-30 DIAGNOSIS — M8580 Other specified disorders of bone density and structure, unspecified site: Secondary | ICD-10-CM

## 2020-05-30 DIAGNOSIS — E782 Mixed hyperlipidemia: Secondary | ICD-10-CM | POA: Diagnosis not present

## 2020-05-30 DIAGNOSIS — R4189 Other symptoms and signs involving cognitive functions and awareness: Secondary | ICD-10-CM | POA: Diagnosis not present

## 2020-05-30 DIAGNOSIS — J329 Chronic sinusitis, unspecified: Secondary | ICD-10-CM | POA: Diagnosis not present

## 2020-05-30 DIAGNOSIS — E890 Postprocedural hypothyroidism: Secondary | ICD-10-CM | POA: Diagnosis not present

## 2020-05-30 DIAGNOSIS — K219 Gastro-esophageal reflux disease without esophagitis: Secondary | ICD-10-CM

## 2020-05-30 DIAGNOSIS — F5105 Insomnia due to other mental disorder: Secondary | ICD-10-CM

## 2020-05-30 DIAGNOSIS — F32A Depression, unspecified: Secondary | ICD-10-CM

## 2020-05-30 DIAGNOSIS — Z1231 Encounter for screening mammogram for malignant neoplasm of breast: Secondary | ICD-10-CM

## 2020-05-30 DIAGNOSIS — Z8639 Personal history of other endocrine, nutritional and metabolic disease: Secondary | ICD-10-CM

## 2020-05-30 DIAGNOSIS — J309 Allergic rhinitis, unspecified: Secondary | ICD-10-CM

## 2020-05-30 DIAGNOSIS — G2581 Restless legs syndrome: Secondary | ICD-10-CM

## 2020-05-30 DIAGNOSIS — F331 Major depressive disorder, recurrent, moderate: Secondary | ICD-10-CM

## 2020-05-30 MED ORDER — ZOLPIDEM TARTRATE 10 MG PO TABS
10.0000 mg | ORAL_TABLET | Freq: Every evening | ORAL | 0 refills | Status: DC | PRN
Start: 2020-05-30 — End: 2020-11-03

## 2020-05-30 MED ORDER — ESTRADIOL 1 MG PO TABS
1.0000 mg | ORAL_TABLET | Freq: Every day | ORAL | 3 refills | Status: DC
Start: 2020-05-30 — End: 2021-06-13

## 2020-05-30 MED ORDER — CITALOPRAM HYDROBROMIDE 40 MG PO TABS: 40.0000 mg | ORAL_TABLET | Freq: Every day | ORAL | 3 refills | Status: DC

## 2020-05-30 MED ORDER — MONTELUKAST SODIUM 10 MG PO TABS: ORAL_TABLET | ORAL | 3 refills | Status: DC

## 2020-05-30 MED ORDER — CALCITRIOL 0.5 MCG PO CAPS: 1.5000 ug | ORAL_CAPSULE | Freq: Every day | ORAL | 3 refills | Status: DC

## 2020-05-30 MED ORDER — LEVOCETIRIZINE DIHYDROCHLORIDE 5 MG PO TABS: 5.0000 mg | ORAL_TABLET | Freq: Every day | ORAL | 3 refills | Status: DC

## 2020-05-30 MED ORDER — CETIRIZINE HCL 10 MG PO TABS: 10.0000 mg | ORAL_TABLET | Freq: Every day | ORAL | 3 refills | Status: DC

## 2020-05-30 MED ORDER — ROPINIROLE HCL 1 MG PO TABS: 1.0000 mg | ORAL_TABLET | Freq: Every day | ORAL | 3 refills | Status: DC

## 2020-05-30 MED ORDER — LEVOTHYROXINE SODIUM 150 MCG PO TABS: ORAL_TABLET | ORAL | 3 refills | Status: DC

## 2020-05-30 MED ORDER — ESOMEPRAZOLE MAGNESIUM 40 MG PO CPDR
40.0000 mg | DELAYED_RELEASE_CAPSULE | Freq: Two times a day (BID) | ORAL | 3 refills | Status: DC
Start: 2020-05-30 — End: 2021-08-30

## 2020-05-30 MED ORDER — ATORVASTATIN CALCIUM 40 MG PO TABS: 40.0000 mg | ORAL_TABLET | Freq: Every day | ORAL | 3 refills | Status: DC

## 2020-05-30 MED ORDER — TIZANIDINE HCL 4 MG PO TABS: 4.0000 mg | ORAL_TABLET | Freq: Four times a day (QID) | ORAL | 2 refills | Status: DC | PRN

## 2020-05-30 MED ORDER — DOXYCYCLINE HYCLATE 100 MG PO TABS: 100.0000 mg | ORAL_TABLET | Freq: Two times a day (BID) | ORAL | 0 refills | Status: DC

## 2020-05-30 MED ORDER — BUPROPION HCL ER (XL) 450 MG PO TB24: ORAL_TABLET | ORAL | 3 refills | Status: DC

## 2020-05-30 NOTE — Progress Notes (Signed)
HPI: Allison Reyes is a 60 y.o. female who  has a past medical history of Depression, GERD (gastroesophageal reflux disease), Hyperlipidemia, Hyperparathyroidism, Osteopenia, and Stress incontinence.  she presents to [facility] today, 05/30/20,  for chief complaint of:  Routine follow-up chronic conditions see below   New concerns: Sinus Sinus issues x2 month associated w/ hoarseness of voice, increased mucus, afebrile. Treated for sinusitis 11/2019 as well. CT sinus done 02/2019 "Chronic inflammatory changes of the left division of the sphenoid sinus with chronic mucoperiosteal thickening with progressive filling in over time. This could relate to chronic symptoms. The other sinuses remain clear and normal" referred to ENT at that time   Mental health  Taking Wellbutriin, hx post-COVID brain fog Taking Celexa 40 mg daily  Ambien as needed - sparing use  Allergies/sinus  Taking Zyrtec, Singulair. Xyzal and Atrovent also on the list   GERD Esomeprazole 40 mg bid   Hypothyroid Synthroid 150 mcg daily, last TSH WNL 02/2019 NO palpiations, fatigue, constipation/diarrhea, weight changes   RLS controlled on Requip   MSK takes Tizanidine prn spasms   CV Atorvastatin 40 mg daily Last Cholesterol panel 07/2019, LDL 137, TG 150, HDL 50     ASSESSMENT/PLAN: The primary encounter diagnosis was Sinusitis, unspecified chronicity, unspecified location. Diagnoses of Brain fog, Chronic depression, Mixed hyperlipidemia, Gastroesophageal reflux disease, unspecified whether esophagitis present, Post-operative hypothyroidism, RESTLESS LEG SYNDROME, Osteopenia, unspecified location, History of hyperparathyroidism, Breast cancer screening by mammogram, Chronic allergic rhinitis, Moderate episode of recurrent major depressive disorder (HCC), Postsurgical hypothyroidism, and Insomnia due to mental condition were also pertinent to this visit.  Orders Placed This  Encounter  Procedures  . MM 3D SCREEN BREAST BILATERAL  . CBC  . COMPLETE METABOLIC PANEL WITH GFR  . Lipid panel  . TSH  . VITAMIN D 25 Hydroxy (Vit-D Deficiency, Fractures)  . Parathyroid hormone, intact (no Ca)  . Ambulatory referral to ENT  . Ambulatory referral to Psychology     Meds ordered this encounter  Medications  . atorvastatin (LIPITOR) 40 MG tablet    Sig: Take 1 tablet (40 mg total) by mouth at bedtime.    Dispense:  90 tablet    Refill:  3  . buPROPion HCl ER, XL, 450 MG TB24    Sig: TAKE 1 TABLET BY MOUTH EVERY MORNING    Dispense:  90 tablet    Refill:  3    If 450 mg not covered, can disp alternative 300 mg po daily #90 refill x3 plus 150 mg po daily #90 refill x3  . calcitRIOL (ROCALTROL) 0.5 MCG capsule    Sig: Take 3 capsules (1.5 mcg total) by mouth daily.    Dispense:  270 capsule    Refill:  3  . cetirizine (ZYRTEC) 10 MG tablet    Sig: Take 1 tablet (10 mg total) by mouth daily.    Dispense:  90 tablet    Refill:  3  . citalopram (CELEXA) 40 MG tablet    Sig: Take 1 tablet (40 mg total) by mouth daily.    Dispense:  90 tablet    Refill:  3  . esomeprazole (NEXIUM) 40 MG capsule    Sig: Take 1 capsule (40 mg total) by mouth 2 (two) times daily before a meal.    Dispense:  180 capsule    Refill:  3  . estradiol (ESTRACE) 1 MG tablet    Sig: Take 1 tablet (1 mg total) by mouth daily.  Dispense:  90 tablet    Refill:  3  . levocetirizine (XYZAL) 5 MG tablet    Sig: Take 1 tablet (5 mg total) by mouth at bedtime.    Dispense:  90 tablet    Refill:  3  . levothyroxine (SYNTHROID) 150 MCG tablet    Sig: TAKE 1 TABLET BY MOUTH DAILY BEFORE BREAKFAST    Dispense:  90 tablet    Refill:  3  . montelukast (SINGULAIR) 10 MG tablet    Sig: TAKE 1 TABLET(10 MG) BY MOUTH AT BEDTIME    Dispense:  90 tablet    Refill:  3  . rOPINIRole (REQUIP) 1 MG tablet    Sig: Take 1 tablet (1 mg total) by mouth at bedtime.    Dispense:  90 tablet    Refill:  3   . tiZANidine (ZANAFLEX) 4 MG tablet    Sig: Take 1 tablet (4 mg total) by mouth every 6 (six) hours as needed for muscle spasms.    Dispense:  45 tablet    Refill:  2  . zolpidem (AMBIEN) 10 MG tablet    Sig: Take 1 tablet (10 mg total) by mouth at bedtime as needed.    Dispense:  45 tablet    Refill:  0  . doxycycline (VIBRA-TABS) 100 MG tablet    Sig: Take 1 tablet (100 mg total) by mouth 2 (two) times daily.    Dispense:  14 tablet    Refill:  0        Follow-up plan: Return in about 6 months (around 11/27/2020) for ANNUAL CHECK-UP - SEE [place] SOONER IF NEEDED.                                                 ################################################# ################################################# ################################################# #################################################    Current Meds  Medication Sig  . doxycycline (VIBRA-TABS) 100 MG tablet Take 1 tablet (100 mg total) by mouth 2 (two) times daily.  . [DISCONTINUED] atorvastatin (LIPITOR) 40 MG tablet Take 1 tablet (40 mg total) by mouth at bedtime.  . [DISCONTINUED] buPROPion 450 MG TB24 TAKE 1 TABLET BY MOUTH EVERY MORNING  . [DISCONTINUED] calcitRIOL (ROCALTROL) 0.5 MCG capsule Take 1.5 mcg by mouth daily.  . [DISCONTINUED] cetirizine (ZYRTEC) 10 MG tablet Take 1 tablet (10 mg total) by mouth daily.  . [DISCONTINUED] citalopram (CELEXA) 40 MG tablet Take 1 tablet (40 mg total) by mouth daily.  . [DISCONTINUED] esomeprazole (NEXIUM) 40 MG capsule Take 1 capsule (40 mg total) by mouth 2 (two) times daily before a meal.  . [DISCONTINUED] estradiol (ESTRACE) 1 MG tablet Take 1 mg by mouth daily.  . [DISCONTINUED] levocetirizine (XYZAL) 5 MG tablet Take 1 tablet (5 mg total) by mouth at bedtime.  . [DISCONTINUED] levothyroxine (SYNTHROID) 150 MCG tablet TAKE 1 TABLET BY MOUTH DAILY BEFORE BREAKFAST  . [DISCONTINUED] montelukast (SINGULAIR) 10 MG  tablet TAKE 1 TABLET(10 MG) BY MOUTH AT BEDTIME  . [DISCONTINUED] rOPINIRole (REQUIP) 1 MG tablet Take 1 tablet (1 mg total) by mouth at bedtime.  . [DISCONTINUED] tiZANidine (ZANAFLEX) 4 MG tablet Take 1 tablet (4 mg total) by mouth every 6 (six) hours as needed for muscle spasms.  . [DISCONTINUED] zolpidem (AMBIEN) 10 MG tablet Take 1 tablet (10 mg total) by mouth at bedtime as needed.    No Known Allergies  Review of Systems: Pertinent (+) and (-) ROS in HPI as above   Exam:  BP 125/75 (BP Location: Left Arm, Patient Position: Sitting, Cuff Size: Large)   Pulse 74   Temp 98.5 F (36.9 C) (Oral)   Wt 252 lb 1.3 oz (114.3 kg)   BMI 46.11 kg/m   Constitutional: VS see above. General Appearance: alert, well-developed, well-nourished, NAD  Neck: No masses, trachea midline.   Respiratory: Normal respiratory effort. no wheeze, no rhonchi, no rales  Cardiovascular: S1/S2 normal, no murmur, no rub/gallop auscultated. RRR.   Musculoskeletal: Gait normal. Symmetric and independent movement of all extremities  Abdominal: non-tender, non-distended, no appreciable organomegaly, neg Murphy's, BS WNLx4  Neurological: Normal balance/coordination. No tremor.  Skin: warm, dry, intact.   Psychiatric: Normal judgment/insight. Normal mood and affect. Oriented x3.       Visit summary with medication list and pertinent instructions was printed for patient to review, patient was advised to alert [place] if any updates are needed. All questions at time of visit were answered - patient instructed to contact office with any additional concerns. ER/RTC precautions were reviewed with the patient and understanding verbalized.     Please note: voice recognition software was used to produce this document, and typos may escape review. Please contact Dr. Lyn Hollingshead for any needed clarifications.    Follow up plan: Return in about 6 months (around 11/27/2020) for ANNUAL CHECK-UP - SEE [place] SOONER IF  NEEDED.

## 2020-05-31 LAB — CBC
HCT: 38.8 % (ref 35.0–45.0)
Hemoglobin: 13.2 g/dL (ref 11.7–15.5)
MCH: 30.6 pg (ref 27.0–33.0)
MCHC: 34 g/dL (ref 32.0–36.0)
MCV: 90 fL (ref 80.0–100.0)
MPV: 10.4 fL (ref 7.5–12.5)
Platelets: 303 10*3/uL (ref 140–400)
RBC: 4.31 10*6/uL (ref 3.80–5.10)
RDW: 11.8 % (ref 11.0–15.0)
WBC: 6.1 10*3/uL (ref 3.8–10.8)

## 2020-05-31 LAB — LIPID PANEL
Cholesterol: 151 mg/dL (ref <200)
HDL: 51 mg/dL (ref >=50)
LDL Cholesterol (Calc): 76 mg/dL (calc)
Non-HDL Cholesterol (Calc): 100 mg/dL (calc) (ref <130)
Total CHOL/HDL Ratio: 3 (calc) (ref <5.0)
Triglycerides: 143 mg/dL (ref <150)

## 2020-05-31 LAB — COMPLETE METABOLIC PANEL WITH GFR
AG Ratio: 1.4 (calc) (ref 1.0–2.5)
ALT: 23 U/L (ref 6–29)
AST: 15 U/L (ref 10–35)
Albumin: 4 g/dL (ref 3.6–5.1)
Alkaline phosphatase (APISO): 108 U/L (ref 37–153)
BUN: 15 mg/dL (ref 7–25)
CO2: 30 mmol/L (ref 20–32)
Calcium: 9.4 mg/dL (ref 8.6–10.4)
Chloride: 103 mmol/L (ref 98–110)
Creat: 1.04 mg/dL (ref 0.50–1.05)
GFR, Est African American: 68 mL/min/{1.73_m2} (ref >=60)
GFR, Est Non African American: 59 mL/min/{1.73_m2} — ABNORMAL LOW (ref >=60)
Globulin: 2.8 g/dL (calc) (ref 1.9–3.7)
Glucose, Bld: 95 mg/dL (ref 65–99)
Potassium: 4.5 mmol/L (ref 3.5–5.3)
Sodium: 140 mmol/L (ref 135–146)
Total Bilirubin: 0.6 mg/dL (ref 0.2–1.2)
Total Protein: 6.8 g/dL (ref 6.1–8.1)

## 2020-05-31 LAB — TSH: TSH: 0.45 mIU/L (ref 0.40–4.50)

## 2020-05-31 LAB — PARATHYROID HORMONE, INTACT (NO CA): PTH: 25 pg/mL (ref 14–64)

## 2020-06-14 ENCOUNTER — Ambulatory Visit (INDEPENDENT_AMBULATORY_CARE_PROVIDER_SITE_OTHER): Payer: BC Managed Care – PPO | Admitting: Otolaryngology

## 2020-06-23 ENCOUNTER — Other Ambulatory Visit (HOSPITAL_COMMUNITY): Payer: Self-pay | Admitting: Internal Medicine

## 2020-06-23 ENCOUNTER — Ambulatory Visit: Payer: BC Managed Care – PPO | Attending: Internal Medicine

## 2020-06-23 DIAGNOSIS — Z23 Encounter for immunization: Secondary | ICD-10-CM

## 2020-06-23 NOTE — Progress Notes (Signed)
   Covid-19 Vaccination Clinic  Name:  Allison Reyes    MRN: [id number] DOB: 1960-10-20  06/23/2020  [name] was observed post Covid-19 immunization for 15 minutes without incident. She was provided with Vaccine Information Sheet and instruction to access the V-Safe system.   [name] was instructed to call 911 with any severe reactions post vaccine: Marland Kitchen Difficulty breathing  . Swelling of face and throat  . A fast heartbeat  . A bad rash all over body  . Dizziness and weakness   Immunizations Administered    Name Date Dose VIS Date Route   PFIZER Comrnaty(Gray TOP) Covid-19 Vaccine 06/23/2020  9:05 AM 0.3 mL 04/06/2020 Intramuscular   Manufacturer: [employer], [employer]   Lot: [id number]   NDC: [phone]

## 2020-06-26 ENCOUNTER — Other Ambulatory Visit: Payer: Self-pay

## 2020-06-26 ENCOUNTER — Ambulatory Visit (INDEPENDENT_AMBULATORY_CARE_PROVIDER_SITE_OTHER): Payer: BC Managed Care – PPO | Admitting: Otolaryngology

## 2020-06-26 VITALS — Temp 96.6°F

## 2020-06-26 DIAGNOSIS — Z8709 Personal history of other diseases of the respiratory system: Secondary | ICD-10-CM

## 2020-06-26 DIAGNOSIS — J310 Chronic rhinitis: Secondary | ICD-10-CM | POA: Diagnosis not present

## 2020-06-26 MED FILL — PFIZER-BIONT COVID-19 VAC-T: 30 | 21 days supply | Qty: 0 | Fill #0

## 2020-06-26 NOTE — Progress Notes (Signed)
HPI: Allison Reyes is a 60 y.o. female who presents is referred by her PCP Dr. Lyn Hollingshead for evaluation of constant "sinus issues" and infections over the past 2 years.  She states that she cannot use Flonase as this makes her sick.  She has been on 2 or 3 rounds of antibiotics over the past 12 months.  She has used prednisone in the past which seems to help the most.  She also takes Xyzal and Singulair for allergies. She apparently had surgery performed in her sinuses at [facility] in 2013.. I reviewed the CT scans of her sinuses performed in 2013 as well as a scan performed in November 2020.  On both scans she has chronic left sphenoid mucosal thickening and bony thickening consistent with chronic left sphenoid sinus disease.  This appears stable between 2013 and 2020.  The remaining sinuses were all clear.  Past Medical History:  Diagnosis Date  . Depression   . GERD (gastroesophageal reflux disease)   . Hyperlipidemia   . Hyperparathyroidism   . Osteopenia   . Stress incontinence    Past Surgical History:  Procedure Laterality Date  . ABDOMINAL HYSTERECTOMY     TAH - endometriosis  . LAPAROSCOPIC ENDOMETRIOSIS FULGURATION    . LAPAROTOMY    . LUMBAR LAMINECTOMY/DECOMPRESSION MICRODISCECTOMY Left 03/07/2017   Procedure: L4-5 Discectomy;  Surgeon: Coletta Memos, MD;  Location: [facility] OR;  Service: Neurosurgery;  Laterality: Left;  . NASAL SINUS SURGERY  01/03/11  . OOPHORECTOMY    . THYROIDECTOMY, PARTIAL     Social History   Socioeconomic History  . Marital status: Married    Spouse name: Not on file  . Number of children: Not on file  . Years of education: Not on file  . Highest education level: Not on file  Occupational History  . Occupation: Conservation officer, historic buildings in [employer]  . Smoking status: Former Smoker    Types: Cigarettes    Quit date: 07/27/2018    Years since quitting: 1.9  . Smokeless tobacco: Never Used  . Tobacco comment: 7 cigarettes  daily.  Vaping Use  . Vaping Use: Never used  Substance and Sexual Activity  . Alcohol use: Yes    Comment: social-wine  . Drug use: No  . Sexual activity: Yes    Birth control/protection: Surgical  Other Topics Concern  . Not on file  Social History Narrative   Lives w/ husband, daughter, son   Social Determinants of Health   Financial Resource Strain: Not on file  Food Insecurity: Not on file  Transportation Needs: Not on file  Physical Activity: Not on file  Stress: Not on file  Social Connections: Not on file   Family History  Problem Relation Age of Onset  . Coronary artery disease Mother   . Hypertension Mother   . Alcohol abuse Mother   . Depression Mother   . Hypertension Father   . Lung cancer Father   . Stroke Paternal Grandfather   . Diabetes Neg Hx   . Colon cancer Neg Hx   . Breast cancer Neg Hx    Allergies  Allergen Reactions  . Flonase [Fluticasone] Nausea And Vomiting   Prior to Admission medications   Medication Sig Start Date End Date Taking? Authorizing Provider  atorvastatin (LIPITOR) 40 MG tablet Take 1 tablet (40 mg total) by mouth at bedtime. 05/30/20   Sunnie Nielsen, DO  buPROPion HCl ER, XL, 450 MG TB24 TAKE 1 TABLET BY MOUTH EVERY MORNING  05/30/20   Sunnie Nielsen, DO  calcitRIOL (ROCALTROL) 0.5 MCG capsule Take 3 capsules (1.5 mcg total) by mouth daily. 05/30/20   Sunnie Nielsen, DO  cetirizine (ZYRTEC) 10 MG tablet Take 1 tablet (10 mg total) by mouth daily. 05/30/20   Sunnie Nielsen, DO  citalopram (CELEXA) 40 MG tablet Take 1 tablet (40 mg total) by mouth daily. 05/30/20   Sunnie Nielsen, DO  doxycycline (VIBRA-TABS) 100 MG tablet Take 1 tablet (100 mg total) by mouth 2 (two) times daily. 05/30/20   Sunnie Nielsen, DO  esomeprazole (NEXIUM) 40 MG capsule Take 1 capsule (40 mg total) by mouth 2 (two) times daily before a meal. 05/30/20   Sunnie Nielsen, DO  estradiol (ESTRACE) 1 MG tablet Take 1 tablet (1 mg total) by mouth  daily. 05/30/20   Sunnie Nielsen, DO  ipratropium (ATROVENT) 0.06 % nasal spray Place 2 sprays into both nostrils 4 (four) times daily. Patient not taking: Reported on 05/30/2020 12/21/19   Sunnie Nielsen, DO  levocetirizine (XYZAL) 5 MG tablet Take 1 tablet (5 mg total) by mouth at bedtime. 05/30/20   Sunnie Nielsen, DO  levothyroxine (SYNTHROID) 150 MCG tablet TAKE 1 TABLET BY MOUTH DAILY BEFORE BREAKFAST 05/30/20   Sunnie Nielsen, DO  montelukast (SINGULAIR) 10 MG tablet TAKE 1 TABLET(10 MG) BY MOUTH AT BEDTIME 05/30/20   Sunnie Nielsen, DO  rOPINIRole (REQUIP) 1 MG tablet Take 1 tablet (1 mg total) by mouth at bedtime. 05/30/20   Sunnie Nielsen, DO  tiZANidine (ZANAFLEX) 4 MG tablet Take 1 tablet (4 mg total) by mouth every 6 (six) hours as needed for muscle spasms. 05/30/20   Sunnie Nielsen, DO  zolpidem (AMBIEN) 10 MG tablet Take 1 tablet (10 mg total) by mouth at bedtime as needed. 05/30/20   Sunnie Nielsen, DO     Positive ROS: Otherwise negative  All other systems have been reviewed and were otherwise negative with the exception of those mentioned in the HPI and as above.  Physical Exam: Constitutional: Alert, well-appearing, no acute distress Ears: External ears without lesions or tenderness. Ear canals are clear bilaterally with intact, clear TMs.  Nasal: External nose without lesions. Septum relatively midline with moderate rhinitis and mucosal swelling..  After decongesting the nose nasal endoscopy was performed on both sides.  The middle meatus regions were clear bilaterally with no obstruction and no mucopurulent discharge.  On evaluation of the left sphenoid region there was no mucopurulent discharge noted in the ostia appeared patent but blunted.  But no active drainage noted.  The nasopharynx was clear. Oral: Lips and gums without lesions. Tongue and palate mucosa without lesions. Posterior oropharynx clear. Neck: No palpable adenopathy or masses Respiratory:  Breathing comfortably  Skin: No facial/neck lesions or rash noted.  Procedures  Assessment: Chronic rhinitis. No clinical evidence of active infection on nasal endoscopy or review of previous CT scan performed in November 2020.  She does have chronic left sphenoid changes but these have not changed significantly since 2013 when she had surgery performed at [facility].  Plan: Reviewed the CT scan with the patient in the office today.  I discussed with her that there is no evidence of active infection of her sinuses presently.  I think a lot of her nasal sinus congestion is related more to chronic rhinitis and that she would benefit from use of nasal steroid spray if she can tolerate it.  I suggested trying Nasacort 1 spray each nostril at night.  During the daytime recommended use of saline  irrigation and suggested trying Xlear brand.  She will continue with use of her antihistamine and Singulair.    Narda Bonds, MD   CC:

## 2020-07-06 ENCOUNTER — Ambulatory Visit: Payer: BC Managed Care – PPO | Admitting: Professional

## 2020-09-08 ENCOUNTER — Other Ambulatory Visit: Payer: Self-pay

## 2020-09-08 ENCOUNTER — Encounter: Payer: Self-pay | Admitting: Emergency Medicine

## 2020-09-08 ENCOUNTER — Emergency Department (INDEPENDENT_AMBULATORY_CARE_PROVIDER_SITE_OTHER)
Admission: EM | Admit: 2020-09-08 | Discharge: 2020-09-08 | Disposition: A | Discharge status: Home / Self Care | Payer: BC Managed Care – PPO | Source: Home / Self Care

## 2020-09-08 DIAGNOSIS — J0100 Acute maxillary sinusitis, unspecified: Secondary | ICD-10-CM

## 2020-09-08 DIAGNOSIS — R059 Cough, unspecified: Secondary | ICD-10-CM

## 2020-09-08 DIAGNOSIS — J029 Acute pharyngitis, unspecified: Secondary | ICD-10-CM | POA: Diagnosis not present

## 2020-09-08 MED ORDER — BENZONATATE 200 MG PO CAPS: 200.0000 mg | ORAL_CAPSULE | Freq: Three times a day (TID) | ORAL | 0 refills | Status: AC | PRN

## 2020-09-08 MED ORDER — PREDNISONE 10 MG PO TABS: 20.0000 mg | ORAL_TABLET | Freq: Every day | ORAL | 0 refills | Status: DC

## 2020-09-08 MED ORDER — AMOXICILLIN-POT CLAVULANATE 875-125 MG PO TABS: 1.0000 | ORAL_TABLET | Freq: Two times a day (BID) | ORAL | 0 refills | Status: DC

## 2020-09-08 NOTE — Discharge Instructions (Signed)
Advised patient take medication with food to completion, advised/encouraged patient increase daily water intake while taking these medications.  Advised patient we will follow-up with lab results once received.

## 2020-09-08 NOTE — ED Provider Notes (Signed)
[facility]    CSN: [id number] Arrival date & time: 09/08/20  0803      History   Chief Complaint Chief Complaint  Patient presents with  . Sore Throat    HPI Allison Reyes is a 60 y.o. female.   HPI Pleasant 60 year old female presents with sore throat, body aches, fever, chills for 4 days.  Reports many family members are sick and currently being treated for sore throat, cough, URI, and sinus infection.  Past Medical History:  Diagnosis Date  . Depression   . GERD (gastroesophageal reflux disease)   . Hyperlipidemia   . Hyperparathyroidism   . Osteopenia   . Stress incontinence     Patient Active Problem List   Diagnosis Date Noted  . Non-restorative sleep 03/01/2019  . Urinary urgency 06/04/2018  . Urine leukocytes increased 06/04/2018  . Change in voice 11/25/2017  . Chronic sinusitis 11/25/2017  . Osteopenia 11/25/2017  . Hypersomnolence 11/25/2017  . HNP (herniated nucleus pulposus), lumbar 03/07/2017  . Back pain 03/05/2017  . Intractable low back pain 03/04/2017  . At risk for obstructive sleep apnea 02/19/2017  . Snoring 02/19/2017  . Encounter for long-term (current) use of medications 02/19/2017  . History of hyperparathyroidism 11/06/2016  . Chronic allergic rhinitis 11/06/2016  . Vitamin D deficiency 11/06/2016  . 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 History of osteopenia 11/06/2016  . Elevated alanine aminotransferase (ALT) level 04/17/2016  . Severe episode of recurrent major depressive disorder, without psychotic features (HCC) 04/15/2016  . Post-operative hypothyroidism 04/15/2016  . Encounter for monitoring statin therapy 05/19/2015  . Morbid obesity due to excess calories (HCC) 05/19/2015  . Hyperlipidemia 08/13/2011  . Stress incontinence 08/13/2011  . Weight gain 01/29/2011  . GERD (gastroesophageal reflux disease) 05/04/2010  . RESTLESS LEG SYNDROME 03/30/2010  . SKIN LESIONS, MULTIPLE 11/22/2008  . Chronic  depression 11/04/2008    Past Surgical History:  Procedure Laterality Date  . ABDOMINAL HYSTERECTOMY     TAH - endometriosis  . LAPAROSCOPIC ENDOMETRIOSIS FULGURATION    . LAPAROTOMY    . LUMBAR LAMINECTOMY/DECOMPRESSION MICRODISCECTOMY Left 03/07/2017   Procedure: L4-5 Discectomy;  Surgeon: Coletta Memosabbell, Kyle, MD;  [facility] OR;  Service: Neurosurgery;  Laterality: Left;  . NASAL SINUS SURGERY  01/03/11  . OOPHORECTOMY    . THYROIDECTOMY, PARTIAL      OB History    Gravida  4   Para  3   Term  3   Preterm      AB  1   Living        SAB  1   IAB      Ectopic      Multiple      Live Births               Home Medications    Prior to Admission medications   Medication Sig Start Date End Date Taking? Authorizing Provider  amoxicillin-clavulanate (AUGMENTIN) 875-125 MG tablet Take 1 tablet by mouth every 12 (twelve) hours. 09/08/20  Yes Trevor Ihaagan, Telesha Deguzman, FNP  benzonatate (TESSALON) 200 MG capsule Take 1 capsule (200 mg total) by mouth 3 (three) times daily as needed for up to 7 days for cough. 09/08/20 09/15/20 Yes Trevor Ihaagan, Darrin Apodaca, FNP  predniSONE (DELTASONE) 10 MG tablet Take 2 tablets (20 mg total) by mouth daily. 09/08/20  Yes Trevor Ihaagan, Angeni Chaudhuri, FNP  atorvastatin (LIPITOR) 40 MG tablet Take 1 tablet (40 mg total) by mouth at bedtime. 05/30/20   Sunnie NielsenAlexander, Natalie, DO  buPROPion HCl ER, XL, 450 MG TB24 TAKE 1 TABLET BY MOUTH EVERY MORNING 05/30/20   Sunnie Nielsen, DO  calcitRIOL (ROCALTROL) 0.5 MCG capsule Take 3 capsules (1.5 mcg total) by mouth daily. 05/30/20   Sunnie Nielsen, DO  cetirizine (ZYRTEC) 10 MG tablet Take 1 tablet (10 mg total) by mouth daily. 05/30/20   Sunnie Nielsen, DO  citalopram (CELEXA) 40 MG tablet Take 1 tablet (40 mg total) by mouth daily. 05/30/20   Sunnie Nielsen, DO  COVID-19 mRNA Vac-TriS, Pfizer, SUSP injection [place] AS DIRECTED 06/23/20 06/23/21  Judyann Munson, MD  doxycycline (VIBRA-TABS) 100 MG tablet Take 1 tablet (100 mg total) by  mouth 2 (two) times daily. 05/30/20   Sunnie Nielsen, DO  esomeprazole (NEXIUM) 40 MG capsule Take 1 capsule (40 mg total) by mouth 2 (two) times daily before a meal. 05/30/20   Sunnie Nielsen, DO  estradiol (ESTRACE) 1 MG tablet Take 1 tablet (1 mg total) by mouth daily. 05/30/20   Sunnie Nielsen, DO  levocetirizine (XYZAL) 5 MG tablet Take 1 tablet (5 mg total) by mouth at bedtime. 05/30/20   Sunnie Nielsen, DO  levothyroxine (SYNTHROID) 150 MCG tablet TAKE 1 TABLET BY MOUTH DAILY BEFORE BREAKFAST 05/30/20   Sunnie Nielsen, DO  montelukast (SINGULAIR) 10 MG tablet TAKE 1 TABLET(10 MG) BY MOUTH AT BEDTIME 05/30/20   Sunnie Nielsen, DO  rOPINIRole (REQUIP) 1 MG tablet Take 1 tablet (1 mg total) by mouth at bedtime. 05/30/20   Sunnie Nielsen, DO  tiZANidine (ZANAFLEX) 4 MG tablet Take 1 tablet (4 mg total) by mouth every 6 (six) hours as needed for muscle spasms. 05/30/20   Sunnie Nielsen, DO  zolpidem (AMBIEN) 10 MG tablet Take 1 tablet (10 mg total) by mouth at bedtime as needed. 05/30/20   Sunnie Nielsen, DO    Family History Family History  Problem Relation Age of Onset  . Coronary artery disease Mother   . Hypertension Mother   . Alcohol abuse Mother   . Depression Mother   . Hypertension Father   . Lung cancer Father   . Stroke Paternal Grandfather   . Diabetes Neg Hx   . Colon cancer Neg Hx   . Breast cancer Neg Hx     Social History Social History   Tobacco Use  . Smoking status: Former Smoker    Types: Cigarettes    Quit date: 07/27/2018    Years since quitting: 2.1  . Smokeless tobacco: Never Used  . Tobacco comment: 7 cigarettes daily.  Vaping Use  . Vaping Use: Never used  Substance Use Topics  . Alcohol use: Yes    Comment: social-wine  . Drug use: No     Allergies   Flonase [fluticasone]   Review of Systems Review of Systems  Constitutional: Positive for fever.  HENT: Positive for congestion and sore throat.   Eyes: Negative.    Respiratory: Positive for cough.   Cardiovascular: Negative.   Gastrointestinal: Negative.   Genitourinary: Negative.   Musculoskeletal: Negative.   Skin: Negative.   Neurological: Negative.      Physical Exam Triage Vital Signs ED Triage Vitals  Enc Vitals Group     BP 09/08/20 0824 (!) 149/90     Pulse Rate 09/08/20 0824 72     Resp 09/08/20 0824 18     Temp 09/08/20 0824 99 F (37.2 C)     Temp Source 09/08/20 0824 Oral     SpO2 09/08/20 0824 97 %     Weight  09/08/20 0826 245 lb (111.1 kg)     Height 09/08/20 0826 5\' 2"  (1.575 m)     Head Circumference --      Peak Flow --      Pain Score 09/08/20 0825 2     Pain Loc --      Pain Edu? --      Excl. in GC? --    No data found.  Updated Vital Signs BP (!) 149/90 (BP Location: Right Arm)   Pulse 72   Temp 99 F (37.2 C) (Oral)   Resp 18   Ht 5\' 2"  (1.575 m)   Wt 245 lb (111.1 kg)   SpO2 97%   BMI 44.81 kg/m      Physical Exam Vitals and nursing note reviewed.  Constitutional:      General: She is not in acute distress.    Appearance: She is obese. She is ill-appearing.  HENT:     Head: Normocephalic and atraumatic.     Right Ear: Ear canal normal. Tympanic membrane is retracted. Tympanic membrane has decreased mobility.     Left Ear: Ear canal normal. Tympanic membrane is retracted. Tympanic membrane has decreased mobility.     Nose: Congestion present.     Right Turbinates: Enlarged.     Left Turbinates: Enlarged.     Right Sinus: Maxillary sinus tenderness present.     Left Sinus: Maxillary sinus tenderness present.     Comments: Turbinates are erythematous bilaterally    Mouth/Throat:     Lips: Pink.     Mouth: Mucous membranes are moist. No oral lesions.     Pharynx: Oropharynx is clear. Uvula midline. Posterior oropharyngeal erythema present. No pharyngeal swelling, oropharyngeal exudate or uvula swelling.     Tonsils: No tonsillar exudate or tonsillar abscesses.  Eyes:     Conjunctiva/sclera:  Conjunctivae normal.     Pupils: Pupils are equal, round, and reactive to light.  Cardiovascular:     Rate and Rhythm: Normal rate and regular rhythm.  Pulmonary:     Effort: Pulmonary effort is normal.     Breath sounds: Normal breath sounds.     Comments: No adventitious breath sounds, infrequent nonproductive cough noted Musculoskeletal:     Cervical back: Normal range of motion and neck supple.  Lymphadenopathy:     Cervical: Cervical adenopathy present.  Skin:    General: Skin is warm and dry.  Neurological:     General: No focal deficit present.     Mental Status: She is alert and oriented to person, place, and time.  Psychiatric:        Mood and Affect: Mood normal.        Behavior: Behavior normal.      UC Treatments / Results  Labs (all labs ordered are listed, but only abnormal results are displayed) Labs Reviewed  COVID-19, FLU A+B AND RSV    EKG   Radiology No results found.  Procedures Procedures (including critical care time)  Medications Ordered in UC Medications - No data to display  Initial Impression / Assessment and Plan / UC Course  I have reviewed the triage vital signs and the nursing notes.  Pertinent labs & imaging results that were available during my care of the patient were reviewed by me and considered in my medical decision making (see chart for details).    MDM: 1.  Acute maxillary sinusitis, 2.  Pharyngitis, 3.  Cough.  Patient discharged home, hemodynamically stable. COVID-19, FLU A &  B swab ordered. Final Clinical Impressions(s) / UC Diagnoses   Final diagnoses:  Pharyngitis, unspecified etiology  Acute non-recurrent maxillary sinusitis  Cough     Discharge Instructions     Advised patient take medication with food to completion, advised/encouraged patient increase daily water intake while taking these medications.  Advised patient we will follow-up with lab results once received.    ED Prescriptions    Medication Sig  Dispense Auth. Provider   amoxicillin-clavulanate (AUGMENTIN) 875-125 MG tablet Take 1 tablet by mouth every 12 (twelve) hours. 14 tablet Trevor Iha, FNP   predniSONE (DELTASONE) 10 MG tablet Take 2 tablets (20 mg total) by mouth daily. 15 tablet Trevor Iha, FNP   benzonatate (TESSALON) 200 MG capsule Take 1 capsule (200 mg total) by mouth 3 (three) times daily as needed for up to 7 days for cough. 30 capsule Trevor Iha, FNP     PDMP not reviewed this encounter.   Trevor Iha, FNP 09/08/20 [phone]

## 2020-09-08 NOTE — ED Triage Notes (Signed)
Sore throat, body aches, fever, chills x 4 days, family sick.  Vaccinated

## 2020-09-11 LAB — COVID-19, FLU A+B AND RSV
Influenza A, NAA: DETECTED — AB
Influenza B, NAA: NOT DETECTED
RSV, NAA: NOT DETECTED
SARS-CoV-2, NAA: NOT DETECTED

## 2020-09-18 ENCOUNTER — Other Ambulatory Visit: Payer: Self-pay

## 2020-09-18 ENCOUNTER — Ambulatory Visit: Payer: BC Managed Care – PPO | Admitting: Family Medicine

## 2020-09-18 ENCOUNTER — Other Ambulatory Visit: Payer: Self-pay | Admitting: *Deleted

## 2020-09-18 ENCOUNTER — Encounter: Payer: Self-pay | Admitting: Osteopathic Medicine

## 2020-09-18 ENCOUNTER — Encounter: Payer: Self-pay | Admitting: Family Medicine

## 2020-09-18 VITALS — BP 126/75 | HR 88 | Temp 97.4°F | Resp 17

## 2020-09-18 DIAGNOSIS — R300 Dysuria: Secondary | ICD-10-CM | POA: Diagnosis not present

## 2020-09-18 DIAGNOSIS — B373 Candidiasis of vulva and vagina: Secondary | ICD-10-CM

## 2020-09-18 DIAGNOSIS — B3731 Acute candidiasis of vulva and vagina: Secondary | ICD-10-CM

## 2020-09-18 LAB — WET PREP FOR TRICH, YEAST, CLUE
MICRO NUMBER:: 11922173
Specimen Quality: ADEQUATE

## 2020-09-18 NOTE — Patient Instructions (Signed)
UA negative. Sending for culture. Sending wet prep to check for yeast. Will let you know results later today or tomorrow.

## 2020-09-18 NOTE — Progress Notes (Signed)
Acute Office Visit  Subjective:    Patient ID: Allison Reyes, female    DOB: 1960-09-11, 60 y.o.   MRN: [id number]  Chief Complaint  Patient presents with  . Dysuria    HPI Patient is in today for dysuria.  Patient reports dysuria and itching beginning Friday. She reports she had been on antibiotics and prednisone following sinusitis last week. She is experiencing burning with urination, increased frequency and urgency, and mildly foul odor. She denies abdominal, flank, back, suprapubic pain, fevers, nausea, vomiting, diarrhea, hematuria, vaginal discharge or irritation, rashes. History of UTIs, but not recurrent. Mutually monogamous sexual relationship - not concerned about STDs.     Past Medical History:  Diagnosis Date  . Depression   . GERD (gastroesophageal reflux disease)   . Hyperlipidemia   . Hyperparathyroidism   . Osteopenia   . Stress incontinence     Past Surgical History:  Procedure Laterality Date  . ABDOMINAL HYSTERECTOMY     TAH - endometriosis  . LAPAROSCOPIC ENDOMETRIOSIS FULGURATION    . LAPAROTOMY    . LUMBAR LAMINECTOMY/DECOMPRESSION MICRODISCECTOMY Left 03/07/2017   Procedure: L4-5 Discectomy;  Surgeon: Coletta Memos, MD;  Location: [facility] OR;  Service: Neurosurgery;  Laterality: Left;  . NASAL SINUS SURGERY  01/03/11  . OOPHORECTOMY    . THYROIDECTOMY, PARTIAL      Family History  Problem Relation Age of Onset  . Coronary artery disease Mother   . Hypertension Mother   . Alcohol abuse Mother   . Depression Mother   . Hypertension Father   . Lung cancer Father   . Stroke Paternal Grandfather   . Diabetes Neg Hx   . Colon cancer Neg Hx   . Breast cancer Neg Hx     Social History   Socioeconomic History  . Marital status: Married    Spouse name: Not on file  . Number of children: Not on file  . Years of education: Not on file  . Highest education level: Not on file  Occupational History  . Occupation: Conservation officer, historic buildings  in [employer]  . Smoking status: Former Smoker    Types: Cigarettes    Quit date: 07/27/2018    Years since quitting: 2.1  . Smokeless tobacco: Never Used  . Tobacco comment: 7 cigarettes daily.  Vaping Use  . Vaping Use: Never used  Substance and Sexual Activity  . Alcohol use: Yes    Comment: social-wine  . Drug use: No  . Sexual activity: Yes    Birth control/protection: Surgical  Other Topics Concern  . Not on file  Social History Narrative   Lives w/ husband, daughter, son   Social Determinants of Health   Financial Resource Strain: Not on file  Food Insecurity: Not on file  Transportation Needs: Not on file  Physical Activity: Not on file  Stress: Not on file  Social Connections: Not on file  Intimate Partner Violence: Not on file    Outpatient Medications Prior to Visit  Medication Sig Dispense Refill  . atorvastatin (LIPITOR) 40 MG tablet Take 1 tablet (40 mg total) by mouth at bedtime. 90 tablet 3  . buPROPion HCl ER, XL, 450 MG TB24 TAKE 1 TABLET BY MOUTH EVERY MORNING 90 tablet 3  . calcitRIOL (ROCALTROL) 0.5 MCG capsule Take 3 capsules (1.5 mcg total) by mouth daily. 270 capsule 3  . cetirizine (ZYRTEC) 10 MG tablet Take 1 tablet (10 mg total) by mouth daily. 90 tablet 3  .  citalopram (CELEXA) 40 MG tablet Take 1 tablet (40 mg total) by mouth daily. 90 tablet 3  . COVID-19 mRNA Vac-TriS, Pfizer, SUSP injection INJECT AS DIRECTED .3 mL 0  . esomeprazole (NEXIUM) 40 MG capsule Take 1 capsule (40 mg total) by mouth 2 (two) times daily before a meal. 180 capsule 3  . estradiol (ESTRACE) 1 MG tablet Take 1 tablet (1 mg total) by mouth daily. 90 tablet 3  . levocetirizine (XYZAL) 5 MG tablet Take 1 tablet (5 mg total) by mouth at bedtime. 90 tablet 3  . levothyroxine (SYNTHROID) 150 MCG tablet TAKE 1 TABLET BY MOUTH DAILY BEFORE BREAKFAST 90 tablet 3  . montelukast (SINGULAIR) 10 MG tablet TAKE 1 TABLET(10 MG) BY MOUTH AT BEDTIME 90 tablet 3  .  rOPINIRole (REQUIP) 1 MG tablet Take 1 tablet (1 mg total) by mouth at bedtime. 90 tablet 3  . tiZANidine (ZANAFLEX) 4 MG tablet Take 1 tablet (4 mg total) by mouth every 6 (six) hours as needed for muscle spasms. 45 tablet 2  . zolpidem (AMBIEN) 10 MG tablet Take 1 tablet (10 mg total) by mouth at bedtime as needed. 45 tablet 0  . amoxicillin-clavulanate (AUGMENTIN) 875-125 MG tablet Take 1 tablet by mouth every 12 (twelve) hours. 14 tablet 0  . predniSONE (DELTASONE) 10 MG tablet Take 2 tablets (20 mg total) by mouth daily. 15 tablet 0   No facility-administered medications prior to visit.    Allergies  Allergen Reactions  . Flonase [Fluticasone] Nausea And Vomiting    Review of Systems All review of systems negative except what is listed in the HPI     Objective:    Physical Exam Vitals reviewed.  Constitutional:      Appearance: Normal appearance.  HENT:     Head: Normocephalic and atraumatic.  Cardiovascular:     Rate and Rhythm: Regular rhythm.     Heart sounds: Normal heart sounds.  Abdominal:     General: There is no distension.     Palpations: Abdomen is soft.     Tenderness: There is no abdominal tenderness. There is no right CVA tenderness or left CVA tenderness.  Genitourinary:    Comments: Pt deferred - self swab for wet prep Skin:    General: Skin is warm and dry.  Neurological:     General: No focal deficit present.     Mental Status: She is alert and oriented to person, place, and time. Mental status is at baseline.  Psychiatric:        Mood and Affect: Mood normal.        Behavior: Behavior normal.        Thought Content: Thought content normal.        Judgment: Judgment normal.     BP 126/75   Pulse 88   Temp (!) 97.4 F (36.3 C)   Resp 17   SpO2 96%  Wt Readings from Last 3 Encounters:  09/08/20 245 lb (111.1 kg)  05/30/20 252 lb 1.3 oz (114.3 kg)  12/21/19 242 lb (109.8 kg)    There are no preventive care reminders to display for this  patient.  There are no preventive care reminders to display for this patient.   Lab Results  Component Value Date   TSH 0.45 05/30/2020   Lab Results  Component Value Date   WBC 6.1 05/30/2020   HGB 13.2 05/30/2020   HCT 38.8 05/30/2020   MCV 90.0 05/30/2020   PLT 303 05/30/2020  Lab Results  Component Value Date   NA 140 05/30/2020   K 4.5 05/30/2020   CO2 30 05/30/2020   GLUCOSE 95 05/30/2020   BUN 15 05/30/2020   CREATININE 1.04 05/30/2020   BILITOT 0.6 05/30/2020   ALKPHOS 119 03/04/2017   AST 15 05/30/2020   ALT 23 05/30/2020   PROT 6.8 05/30/2020   ALBUMIN 3.6 03/04/2017   CALCIUM 9.4 05/30/2020   ANIONGAP 7 03/08/2017   GFR 64.98 05/19/2015   Lab Results  Component Value Date   CHOL 151 05/30/2020   Lab Results  Component Value Date   HDL 51 05/30/2020   Lab Results  Component Value Date   LDLCALC 76 05/30/2020   Lab Results  Component Value Date   TRIG 143 05/30/2020   Lab Results  Component Value Date   CHOLHDL 3.0 05/30/2020   Lab Results  Component Value Date   HGBA1C 5.9 (H) 11/26/2017       Assessment & Plan:   1. Dysuria UA negative - will send for culture. Wet prep sent today (self-swab). Will update patient with results and any changes to plan of care. Educated on signs and symptoms requiring further evaluations. Good hygiene and hydration encouraged.   - Urinalysis - Urine Culture - WET PREP FOR TRICH, YEAST, CLUE  Follow-up if symptoms worsen or fail to improve.   Clayborne Dana, NP

## 2020-09-19 LAB — URINE CULTURE
MICRO NUMBER:: 11922174
SPECIMEN QUALITY:: ADEQUATE

## 2020-09-19 MED ORDER — FLUCONAZOLE 150 MG PO TABS: 150.0000 mg | ORAL_TABLET | Freq: Once | ORAL | 1 refills | Status: AC

## 2020-09-19 NOTE — Progress Notes (Signed)
MyChart message sent - Your wet prep was positive for yeast infection. I'm sending in some Diflucan. Take one tablet today. If you need to repeat another dose in 3 days, there will be one refill on the prescription. I hope you feel better soon!

## 2020-09-19 NOTE — Addendum Note (Signed)
Addended by: Hyman Hopes B on: 09/19/2020 07:48 AM   Modules accepted: Orders

## 2020-09-20 ENCOUNTER — Encounter: Payer: Self-pay | Admitting: Osteopathic Medicine

## 2020-09-20 NOTE — Progress Notes (Signed)
MyChart message sent - Your urine culture did not show a concern for infection. Are you feeling any better after taking the Diflucan for the yeast infection?

## 2020-10-05 ENCOUNTER — Encounter: Payer: Self-pay | Admitting: Osteopathic Medicine

## 2020-10-05 ENCOUNTER — Other Ambulatory Visit: Payer: Self-pay

## 2020-10-05 ENCOUNTER — Ambulatory Visit (INDEPENDENT_AMBULATORY_CARE_PROVIDER_SITE_OTHER): Payer: BC Managed Care – PPO | Admitting: Osteopathic Medicine

## 2020-10-05 VITALS — BP 129/84 | HR 83 | Temp 98.3°F | Wt 255.0 lb

## 2020-10-05 DIAGNOSIS — B3731 Acute candidiasis of vulva and vagina: Secondary | ICD-10-CM

## 2020-10-05 DIAGNOSIS — A599 Trichomoniasis, unspecified: Secondary | ICD-10-CM

## 2020-10-05 DIAGNOSIS — B373 Candidiasis of vulva and vagina: Secondary | ICD-10-CM

## 2020-10-05 MED ORDER — FLUCONAZOLE 150 MG PO TABS: 150.0000 mg | ORAL_TABLET | Freq: Once | ORAL | 1 refills | Status: AC

## 2020-10-05 NOTE — Patient Instructions (Signed)
Refilled Diflucan Await swab results and will change meds if needed

## 2020-10-05 NOTE — Progress Notes (Signed)
Allison Reyes is a 60 y.o. female who presents to  [facility]  today, 10/05/20, seeking care for the following:  Concern for vaginal irritation. Saw one of our NP's here 09/18/20, treated w/ diflucan, UCx negative, self swab vaginal specimen (+)yeast. She reports vaginal itching/iritation has recurred. Labs reviewed, NO hyperglycemia on labs 05/2020     ASSESSMENT & PLAN with other pertinent findings:  The primary encounter diagnosis was Yeast vaginitis. A diagnosis of Recurrent candidiasis of vagina was also pertinent to this visit.    Patient Instructions  Refilled Diflucan Await swab results and will change meds if needed  Orders Placed This Encounter  Procedures   SureSwab Recurrent Vaginitis Plus     Meds ordered this encounter  Medications   fluconazole (DIFLUCAN) 150 MG tablet    Sig: Take 1 tablet (150 mg total) by mouth once for 1 dose. Repeat dose 72 hours if yeast infection persists    Dispense:  2 tablet    Refill:  1      See below for relevant physical exam findings  See below for recent lab and imaging results reviewed  Medications, allergies, PMH, PSH, SocH, FamH reviewed below    Follow-up instructions: Return for RECHECK PENDING RESULTS / IF WORSE OR CHANGE.                                        Exam:  BP 129/84 (BP Location: Left Arm, Patient Position: Sitting, Cuff Size: Large)   Pulse 83   Temp 98.3 F (36.8 C) (Oral)   Wt 255 lb (115.7 kg)   BMI 46.64 kg/m  Constitutional: VS see above. General Appearance: alert, well-developed, well-nourished, NAD Neck: No masses, trachea midline.  Respiratory: Normal respiratory effort.  Musculoskeletal: Gait normal. Symmetric and independent movement of all extremities Neurological: Normal balance/coordination. No tremor. Skin: warm, dry, intact.  Psychiatric: Normal judgment/insight. Normal mood and  affect. Oriented x3.  GYN: No lesions/ulcers to external genitalia, normal urethra, normal vaginal mucosa, physiologic discharge, cervix normal without lesions, uterus not enlarged or tender, adnexa no masses and nontender   Current Meds  Medication Sig   atorvastatin (LIPITOR) 40 MG tablet Take 1 tablet (40 mg total) by mouth at bedtime.   buPROPion HCl ER, XL, 450 MG TB24 TAKE 1 TABLET BY MOUTH EVERY MORNING   calcitRIOL (ROCALTROL) 0.5 MCG capsule Take 3 capsules (1.5 mcg total) by mouth daily.   cetirizine (ZYRTEC) 10 MG tablet Take 1 tablet (10 mg total) by mouth daily.   citalopram (CELEXA) 40 MG tablet Take 1 tablet (40 mg total) by mouth daily.   COVID-19 mRNA Vac-TriS, Pfizer, SUSP injection INJECT AS DIRECTED   esomeprazole (NEXIUM) 40 MG capsule Take 1 capsule (40 mg total) by mouth 2 (two) times daily before a meal.   estradiol (ESTRACE) 1 MG tablet Take 1 tablet (1 mg total) by mouth daily.   fluconazole (DIFLUCAN) 150 MG tablet Take 1 tablet (150 mg total) by mouth once for 1 dose. Repeat dose 72 hours if yeast infection persists   levocetirizine (XYZAL) 5 MG tablet Take 1 tablet (5 mg total) by mouth at bedtime.   levothyroxine (SYNTHROID) 150 MCG tablet TAKE 1 TABLET BY MOUTH DAILY BEFORE BREAKFAST   montelukast (SINGULAIR) 10 MG tablet TAKE 1 TABLET(10 MG) BY MOUTH AT BEDTIME   rOPINIRole (REQUIP) 1 MG  tablet Take 1 tablet (1 mg total) by mouth at bedtime.   tiZANidine (ZANAFLEX) 4 MG tablet Take 1 tablet (4 mg total) by mouth every 6 (six) hours as needed for muscle spasms.   zolpidem (AMBIEN) 10 MG tablet Take 1 tablet (10 mg total) by mouth at bedtime as needed.    Allergies  Allergen Reactions   Flonase [Fluticasone] Nausea And Vomiting    Patient Active Problem List   Diagnosis Date Noted   Non-restorative sleep 03/01/2019   Urinary urgency 06/04/2018   Urine leukocytes increased 06/04/2018   Change in voice 11/25/2017   Chronic sinusitis 11/25/2017    Osteopenia 11/25/2017   Hypersomnolence 11/25/2017   HNP (herniated nucleus pulposus), lumbar 03/07/2017   Back pain 03/05/2017   Intractable low back pain 03/04/2017   At risk for obstructive sleep apnea 02/19/2017   Snoring 02/19/2017   Encounter for long-term (current) use of medications 02/19/2017   History of hyperparathyroidism 11/06/2016   Chronic allergic rhinitis 11/06/2016   Vitamin D deficiency 11/06/2016   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/06/2016   History of osteopenia 11/06/2016   Elevated alanine aminotransferase (ALT) level 04/17/2016   Severe episode of recurrent major depressive disorder, without psychotic features (HCC) 04/15/2016   Post-operative hypothyroidism 04/15/2016   Encounter for monitoring statin therapy 05/19/2015   Morbid obesity due to excess calories (HCC) 05/19/2015   Hyperlipidemia 08/13/2011   Stress incontinence 08/13/2011   Weight gain 01/29/2011   GERD (gastroesophageal reflux disease) 05/04/2010   RESTLESS LEG SYNDROME 03/30/2010   SKIN LESIONS, MULTIPLE 11/22/2008   Chronic depression 11/04/2008    Family History  Problem Relation Age of Onset   Coronary artery disease Mother    Hypertension Mother    Alcohol abuse Mother    Depression Mother    Hypertension Father    Lung cancer Father    Stroke Paternal Grandfather    Diabetes Neg Hx    Colon cancer Neg Hx    Breast cancer Neg Hx     Social History   Tobacco Use  Smoking Status Former   Pack years: 0.00   Types: Cigarettes   Quit date: 07/27/2018   Years since quitting: 2.1  Smokeless Tobacco Never  Tobacco Comments   7 cigarettes daily.    Past Surgical History:  Procedure Laterality Date   ABDOMINAL HYSTERECTOMY     TAH - endometriosis   LAPAROSCOPIC ENDOMETRIOSIS FULGURATION     LAPAROTOMY     LUMBAR LAMINECTOMY/DECOMPRESSION MICRODISCECTOMY Left 03/07/2017   Procedure: L4-5 Discectomy;  Surgeon: Coletta Memos, MD;  Location: [facility] OR;  Service: Neurosurgery;   Laterality: Left;   NASAL SINUS SURGERY  01/03/11   OOPHORECTOMY     THYROIDECTOMY, PARTIAL      Immunization History  Administered Date(s) Administered   Influenza Inj Mdck Quad Pf 01/16/2018   Influenza,inj,Quad PF,6+ Mos 01/28/2019   Influenza-Unspecified 03/08/2020   PFIZER Comirnaty(Gray Top)Covid-19 Tri-Sucrose Vaccine 06/23/2020   PFIZER(Purple Top)SARS-COV-2 Vaccination 07/15/2019, 08/09/2019   Tdap 08/13/2011    Recent Results (from the past 2160 hour(s))  ([facility]) COVID-19, Flu A+B and RSV     Status: Abnormal   Collection Time: 09/08/20  8:58 AM  Result Value Ref Range   SARS-CoV-2, NAA Not Detected Not Detected    Comment: This nucleic acid amplification test was developed and its performance characteristics determined by [employer]. Nucleic acid amplification tests include RT-PCR and TMA. This test has not been FDA cleared or approved. This test has been authorized  by FDA under an Emergency Use Authorization (EUA). This test is only authorized for the duration of time the declaration that circumstances exist justifying the authorization of the emergency use of in vitro diagnostic tests for detection of SARS-CoV-2 virus and/or diagnosis of COVID-19 infection under section 564(b)(1) of the Act, 21 U.S.C. 220URK-2(H) (1), unless the authorization is terminated or revoked sooner. When diagnostic testing is negative, the possibility of a false negative result should be considered in the context of a patient's recent exposures and the presence of clinical signs and symptoms consistent with COVID-19. An individual without symptoms of COVID-19 and who is not shedding SARS-CoV-2 virus wo uld expect to have a negative (not detected) result in this assay.    Influenza A, NAA Detected (A) Not Detected   Influenza B, NAA Not Detected Not Detected   RSV, NAA Not Detected Not Detected  WET PREP FOR TRICH, YEAST, CLUE     Status: Abnormal   Collection Time: 09/18/20   2:22 PM  Result Value Ref Range   MICRO NUMBER: [id number]    Specimen Quality Adequate    SOURCE: VAGINAL    Status FINAL    RESULT yeast (A)     Comment: Yeast present No Trichomonas vaginalis seen. No clue cells seen Epithelial Cells Present  Urine Culture     Status: None   Collection Time: 09/18/20  2:29 PM   Specimen: Urine  Result Value Ref Range   MICRO NUMBER: [id number]    SPECIMEN QUALITY: Adequate    Sample Source NOT GIVEN    STATUS: FINAL    ISOLATE 1:      Less than 10,000 CFU/mL of single Gram negative organism isolated. No further testing will be performed. If clinically indicated, recollection using a method to minimize contamination, with prompt transfer to Urine Culture Transport Tube, is recommended.    No results found.     All questions at time of visit were answered - patient instructed to contact office with any additional concerns or updates. ER/RTC precautions were reviewed with the patient as applicable.   Please note: manual typing as well as voice recognition software may have been used to produce this document - typos may escape review. Please contact Dr. Lyn Hollingshead for any needed clarifications.

## 2020-10-12 DIAGNOSIS — A599 Trichomoniasis, unspecified: Secondary | ICD-10-CM | POA: Insufficient documentation

## 2020-10-12 LAB — SURESWAB® RECURRENT VAGINITIS PLUS
ATOPOBIUM VAGINAE: NOT DETECTED Log (cells/mL)
C. ALBICANS, DNA: NOT DETECTED
CHLAMYDIA TRACHOMATIS RNA, TMA, UROGENITAL: NOT DETECTED
GARDNERELLA VAGINALIS: NOT DETECTED Log (cells/mL)
LACTOBACILLUS SPECIES: NOT DETECTED Log (cells/mL)
MEGASPHAERA SPECIES: NOT DETECTED Log (cells/mL)
NEISSERIA GONORRHOEAE RNA, TMA, UROGENITAL: NOT DETECTED
SURESWAB(R) TRICHOMONAS VAGINALIS RNA, QL, TMA: DETECTED — AB

## 2020-10-12 MED ORDER — METRONIDAZOLE 500 MG PO TABS: 500.0000 mg | ORAL_TABLET | Freq: Two times a day (BID) | ORAL | 0 refills | Status: AC

## 2020-10-12 NOTE — Addendum Note (Signed)
Addended by: Deirdre Pippins on: 10/12/2020 08:43 PM   Modules accepted: Orders

## 2020-10-16 IMAGING — MG DIGITAL SCREENING BILAT W/ TOMO W/ CAD
6 of 12 series · 6 of 36 positions shown · non-contrast
Comparison: Previous exam(s).

CLINICAL DATA: Screening.

EXAM:
DIGITAL SCREENING BILATERAL MAMMOGRAM WITH TOMO AND CAD

[L CC synth-2D]
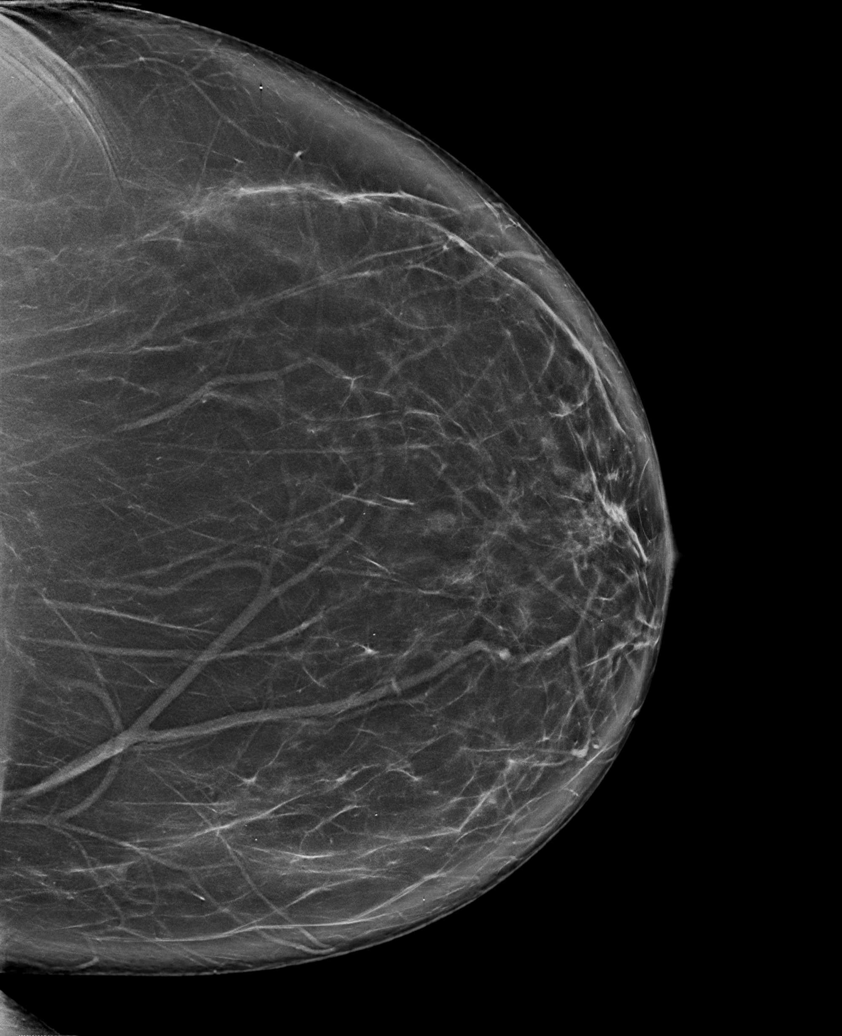

[L MLO synth-2D]
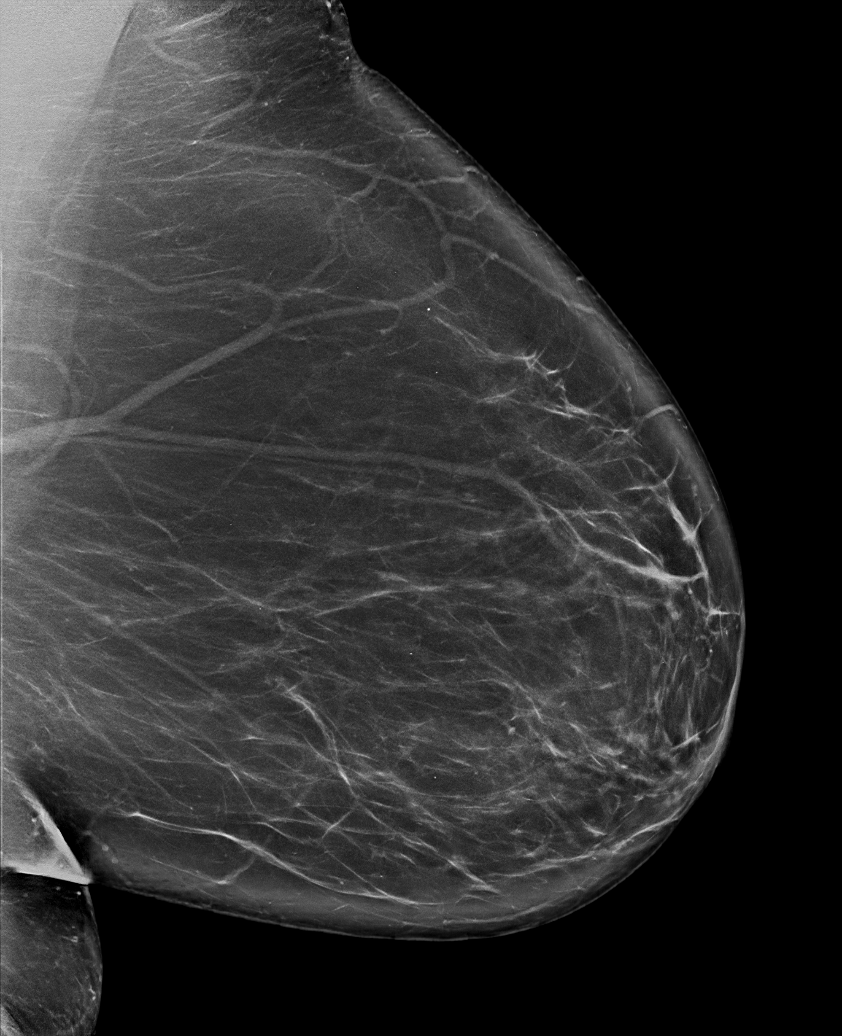

[R XCCL synth-2D]
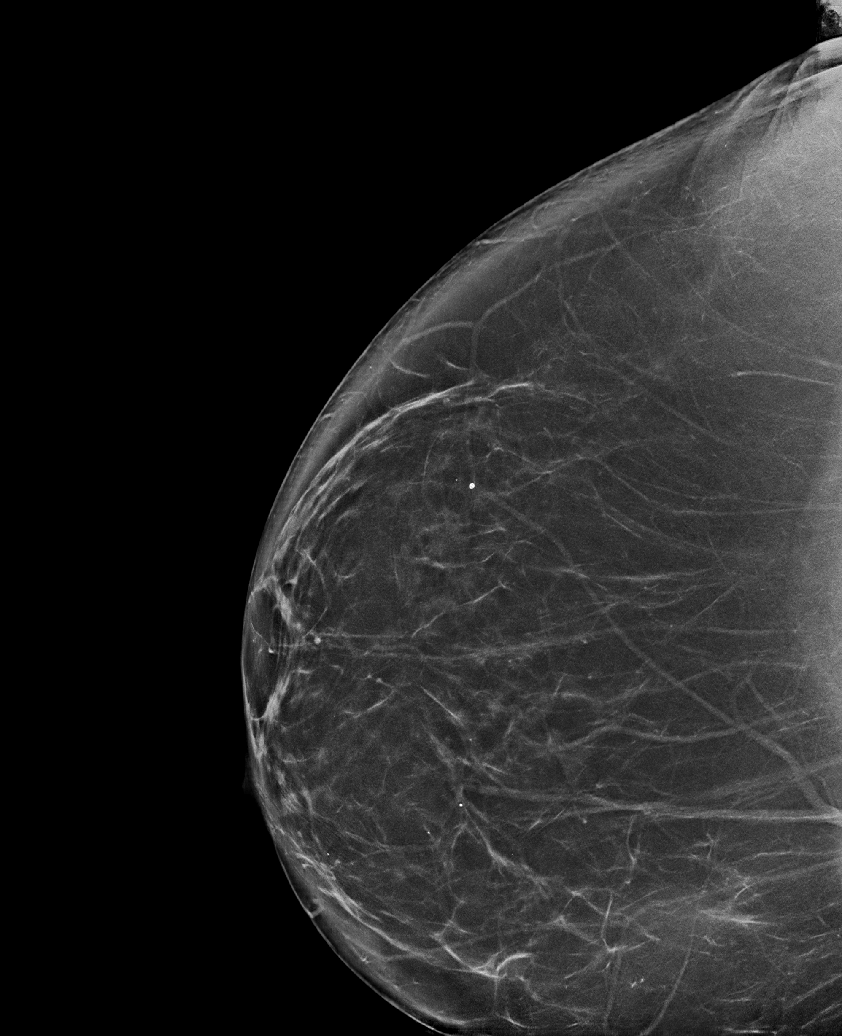

[R CC synth-2D]
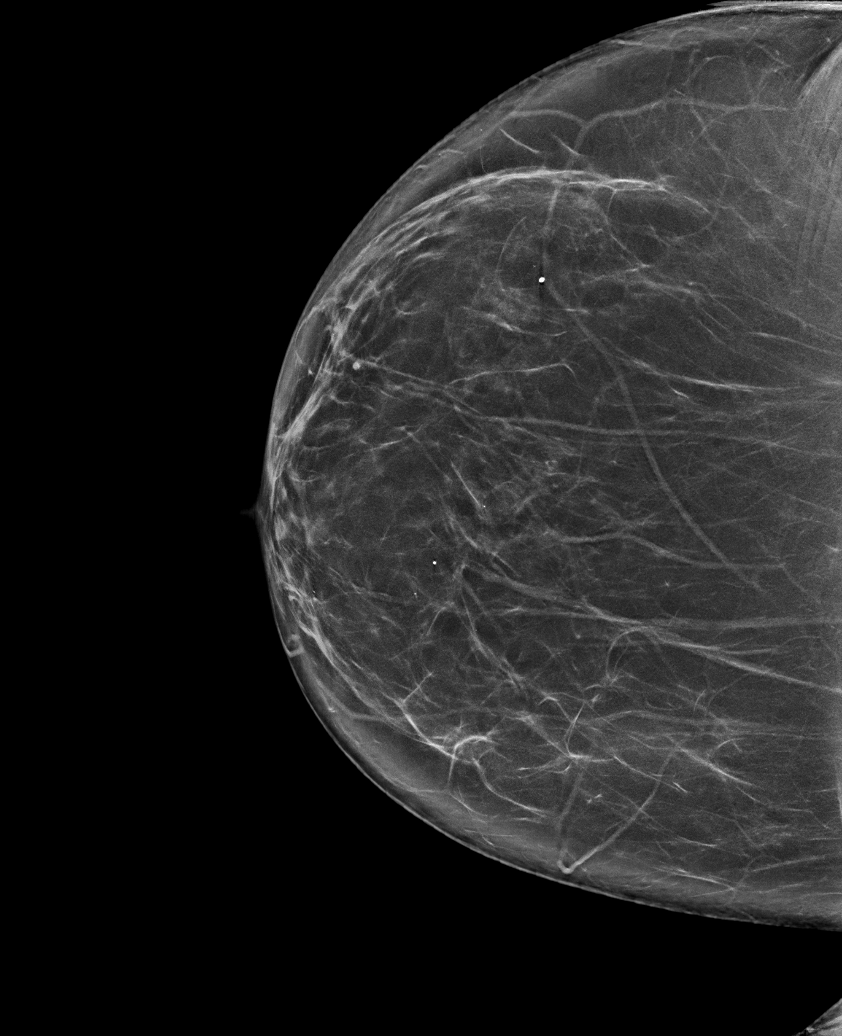

[L XCCL synth-2D]
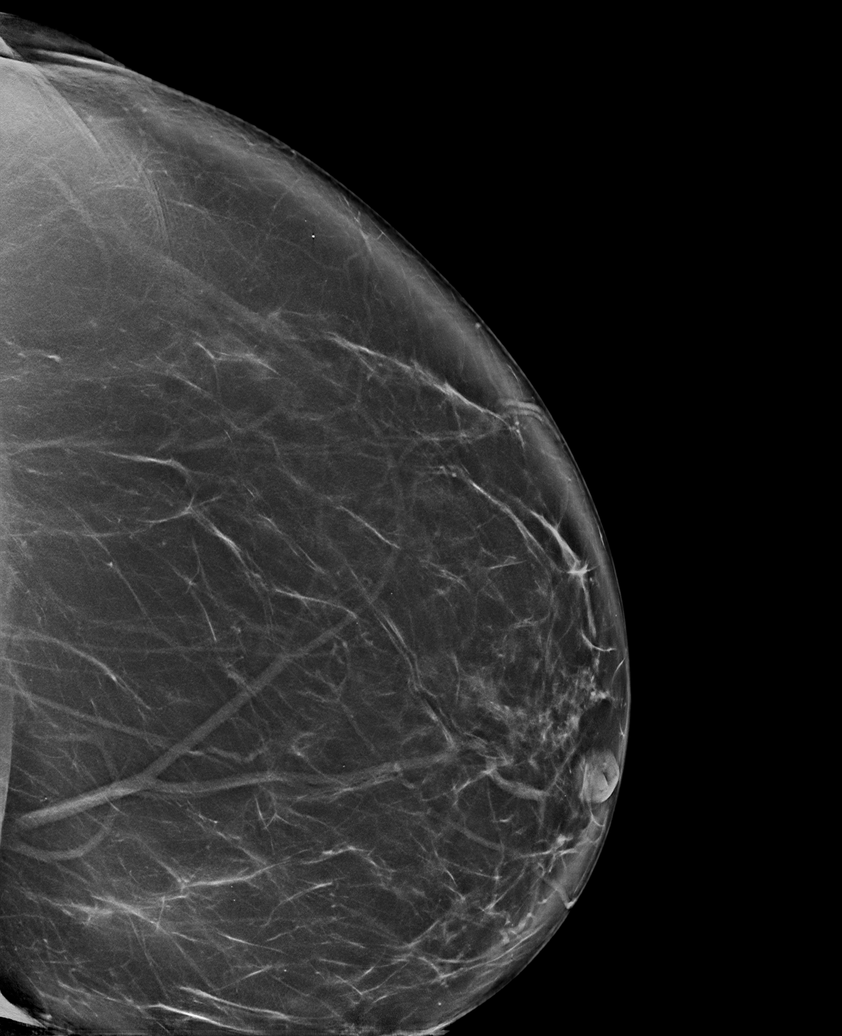

[R MLO synth-2D]
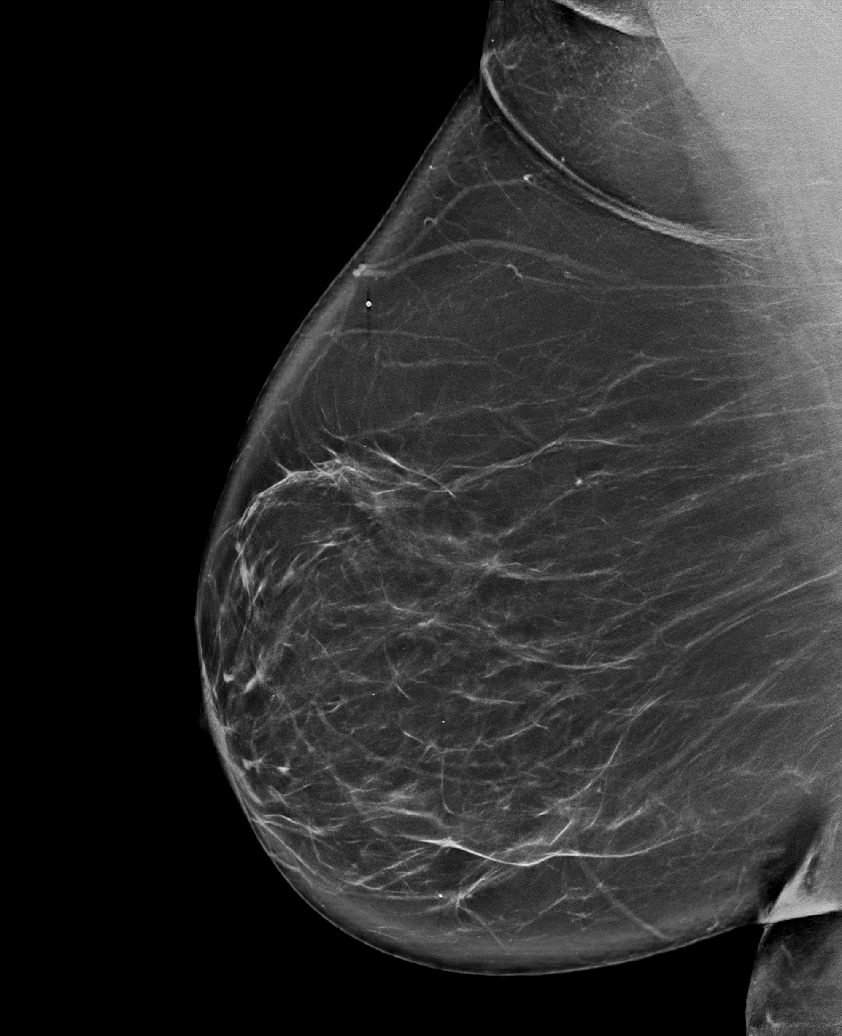

[6 of 36 positions shown; findings below may reference images not displayed]

ACR Breast Density Category b: There are scattered areas of
fibroglandular density.
FINDINGS: There are no findings suspicious for malignancy. Images were
processed with CAD.
IMPRESSION: No mammographic evidence of malignancy. A result letter of this
screening mammogram will be mailed directly to the patient.

RECOMMENDATION:
Screening mammogram in one year. (Code:[id number])

BI-RADS CATEGORY  1: Negative.

## 2020-11-02 ENCOUNTER — Other Ambulatory Visit: Payer: Self-pay | Admitting: Osteopathic Medicine

## 2020-11-03 NOTE — Telephone Encounter (Signed)
Last written 05/30/2020 #45 no refills Last appt 10/05/2020

## 2020-11-08 ENCOUNTER — Other Ambulatory Visit: Payer: Self-pay | Admitting: Osteopathic Medicine

## 2020-11-22 ENCOUNTER — Telehealth: Payer: Self-pay

## 2020-11-22 NOTE — Telephone Encounter (Signed)
[facility] pharmacy requesting med refill for bupropion xl 300 mg. Rx not listed in active med list.

## 2020-11-24 MED ORDER — BUPROPION HCL ER (XL) 450 MG PO TB24: ORAL_TABLET | ORAL | 3 refills | Status: DC

## 2020-12-02 ENCOUNTER — Other Ambulatory Visit: Payer: Self-pay | Admitting: Osteopathic Medicine

## 2020-12-02 DIAGNOSIS — R3915 Urgency of urination: Secondary | ICD-10-CM

## 2020-12-12 ENCOUNTER — Telehealth: Payer: Self-pay

## 2020-12-12 MED ORDER — BUPROPION HCL ER (XL) 300 MG PO TB24: 300.0000 mg | ORAL_TABLET | Freq: Every day | ORAL | 3 refills | Status: DC

## 2020-12-12 MED ORDER — BUPROPION HCL ER (XL) 150 MG PO TB24: 150.0000 mg | ORAL_TABLET | Freq: Every day | ORAL | 3 refills | Status: DC

## 2020-12-12 NOTE — Telephone Encounter (Signed)
Spoke with Chanetta Marshall at [facility], he states that they do not have the 450mg  and ask if he can get new prescriptions for the 300mg  and 150mg  like the note on the 11/24/20 bupripion XL 450mg  Rx says. Verbally authorized the prescriptions.

## 2021-01-08 ENCOUNTER — Telehealth: Payer: Self-pay

## 2021-01-08 NOTE — Telephone Encounter (Signed)
Medication: buPROPion HCl ER, XL, 450 MG TB24 Prior authorization submitted via CoverMyMeds on 01/08/2021 PA submission pending

## 2021-01-08 NOTE — Telephone Encounter (Signed)
Medication: citalopram (CELEXA) 40 MG tablet Prior authorization determination received Medication has been approved Approval dates: 01/08/2021-01/07/2022  Patient aware via: MyChart Pharmacy aware: Yes Provider aware via this encounter

## 2021-01-08 NOTE — Telephone Encounter (Signed)
Medication: citalopram (CELEXA) 40 MG tablet Prior authorization submitted via CoverMyMeds on 01/08/2021 PA submission pending

## 2021-02-06 NOTE — Telephone Encounter (Signed)
Medication:  buPROPion HCl ER, XL, 450 MG TB24 Prior authorization determination received Medication has been denied Reason for denial:  "This medication is approved when generic Bupropion HCl SR 24 hr tablets have been tries for at least 30 days and did not work or were not tolerated. In this case, the member has not tried generic bupropion HCl SR 24 hr tablets"   Looking in her chart, she has been changed to buPROPion (WELLBUTRIN XL) 300 MG 24 hr tablet in addition to buPROPion (WELLBUTRIN XL) 150 MG 24 hr tablet

## 2021-04-27 DIAGNOSIS — J209 Acute bronchitis, unspecified: Secondary | ICD-10-CM | POA: Diagnosis not present

## 2021-06-01 ENCOUNTER — Other Ambulatory Visit: Payer: Self-pay | Admitting: Osteopathic Medicine

## 2021-06-13 ENCOUNTER — Other Ambulatory Visit: Payer: Self-pay | Admitting: Osteopathic Medicine

## 2021-06-13 ENCOUNTER — Other Ambulatory Visit: Payer: Self-pay

## 2021-06-13 MED ORDER — ESTRADIOL 1 MG PO TABS: 1.0000 mg | ORAL_TABLET | Freq: Every day | ORAL | 3 refills | Status: DC

## 2021-06-14 ENCOUNTER — Other Ambulatory Visit: Payer: Self-pay | Admitting: Neurology

## 2021-06-14 MED ORDER — CALCITRIOL 0.5 MCG PO CAPS: 1.5000 ug | ORAL_CAPSULE | Freq: Every day | ORAL | 0 refills | Status: DC

## 2021-06-20 DIAGNOSIS — J209 Acute bronchitis, unspecified: Secondary | ICD-10-CM | POA: Diagnosis not present

## 2021-07-17 ENCOUNTER — Other Ambulatory Visit: Payer: Self-pay | Admitting: Family Medicine

## 2021-07-24 ENCOUNTER — Other Ambulatory Visit: Payer: Self-pay | Admitting: Family Medicine

## 2021-07-24 ENCOUNTER — Other Ambulatory Visit: Payer: Self-pay | Admitting: Neurology

## 2021-07-24 MED ORDER — CITALOPRAM HYDROBROMIDE 40 MG PO TABS: 40.0000 mg | ORAL_TABLET | Freq: Every day | ORAL | 0 refills | Status: DC

## 2021-07-31 ENCOUNTER — Other Ambulatory Visit: Payer: Self-pay

## 2021-07-31 MED ORDER — ATORVASTATIN CALCIUM 40 MG PO TABS: 40.0000 mg | ORAL_TABLET | Freq: Every day | ORAL | 0 refills | Status: DC

## 2021-08-30 ENCOUNTER — Ambulatory Visit (INDEPENDENT_AMBULATORY_CARE_PROVIDER_SITE_OTHER): Payer: BC Managed Care – PPO | Admitting: Family Medicine

## 2021-08-30 ENCOUNTER — Encounter: Payer: Self-pay | Admitting: Family Medicine

## 2021-08-30 VITALS — BP 120/84 | HR 77 | Temp 97.2°F | Resp 17 | Ht 62.0 in | Wt 252.2 lb

## 2021-08-30 DIAGNOSIS — N393 Stress incontinence (female) (male): Secondary | ICD-10-CM

## 2021-08-30 DIAGNOSIS — E782 Mixed hyperlipidemia: Secondary | ICD-10-CM | POA: Diagnosis not present

## 2021-08-30 DIAGNOSIS — R490 Dysphonia: Secondary | ICD-10-CM | POA: Diagnosis not present

## 2021-08-30 DIAGNOSIS — K219 Gastro-esophageal reflux disease without esophagitis: Secondary | ICD-10-CM

## 2021-08-30 DIAGNOSIS — R0683 Snoring: Secondary | ICD-10-CM | POA: Diagnosis not present

## 2021-08-30 DIAGNOSIS — E890 Postprocedural hypothyroidism: Secondary | ICD-10-CM | POA: Diagnosis not present

## 2021-08-30 DIAGNOSIS — F332 Major depressive disorder, recurrent severe without psychotic features: Secondary | ICD-10-CM

## 2021-08-30 MED ORDER — CITALOPRAM HYDROBROMIDE 40 MG PO TABS: 40.0000 mg | ORAL_TABLET | Freq: Every day | ORAL | 1 refills | Status: DC

## 2021-08-30 MED ORDER — ESOMEPRAZOLE MAGNESIUM 40 MG PO CPDR: 40.0000 mg | DELAYED_RELEASE_CAPSULE | Freq: Two times a day (BID) | ORAL | 3 refills | Status: DC

## 2021-08-30 MED ORDER — BUPROPION HCL ER (XL) 150 MG PO TB24: 150.0000 mg | ORAL_TABLET | Freq: Every day | ORAL | 1 refills | Status: DC

## 2021-08-30 MED ORDER — LEVOTHYROXINE SODIUM 150 MCG PO TABS: ORAL_TABLET | ORAL | 1 refills | Status: DC

## 2021-08-30 MED ORDER — WEGOVY 0.25 MG/0.5ML ~~LOC~~ SOAJ: 0.2500 mg | SUBCUTANEOUS | 1 refills | Status: DC

## 2021-08-30 MED ORDER — BUPROPION HCL ER (XL) 300 MG PO TB24: 300.0000 mg | ORAL_TABLET | Freq: Every day | ORAL | 1 refills | Status: DC

## 2021-08-30 MED ORDER — MONTELUKAST SODIUM 10 MG PO TABS: ORAL_TABLET | ORAL | 1 refills | Status: DC

## 2021-08-30 MED ORDER — ATORVASTATIN CALCIUM 40 MG PO TABS: 40.0000 mg | ORAL_TABLET | Freq: Every day | ORAL | 1 refills | Status: DC

## 2021-08-30 NOTE — Progress Notes (Signed)
? ?  Subjective:  ? ? Patient ID: Allison Reyes, female    DOB: 1960/07/30, 61 y.o.   MRN: [id number] ? ?HPI ?Pt here today to re-establish care.  Was previously going to [place] ? ?Depression- chronic problem, on Wellbutrin 450mg  daily, Celexa 40mg  daily.  Pt had a terrible relationship w/ mother and the older she gets, the more she looks like mom and this is very triggering for her. ? ?Hyperlipidemia- chronic problem, on Lipitor 40mg  daily ? ?Hypothyroid- chronic problem, on Levothyroxine daily.  Pt reports she is out of medication.  Has only been taking it for the last 10 days. ? ?GERD- pt has been out of Esomeprazole and trying to replace w/ OTC meds but not effective ? ?Chronic hoarseness- pt reports this has been going on for last 5 yrs.  Has both allergies and GERD.  Has been to multiple ENTs w/o insight or resolution of sxs.  Has not seen GI recently.   ? ?Loud snoring- pt was supposed to have a sleep study a few years ago but this never happened.  Pt wakes w/ very dry mouth. ? ?Stress incontinence- pt reports this will occur w/ exercise, coughing, bending over to pick something up.  Sxs have been worsening.  She needs to wear a pad regularly.  Has not been to urology.  S/p hysterectomy. ? ?Obesity- pt reports she is not able to exercise due to stress incontinence.  Is walking quite a bit at work but not losing any weight.  Would like to lose 100 lbs.  States weight is playing a large role in depression.   ? ? ?Review of Systems ?For ROS see HPI  ?   ?Objective:  ? Physical Exam ?Vitals reviewed.  ?Constitutional:   ?   General: She is not in acute distress. ?   Appearance: Normal appearance. She is well-developed. She is obese. She is not ill-appearing.  ?HENT:  ?   Head: Normocephalic and atraumatic.  ?Eyes:  ?   Conjunctiva/sclera: Conjunctivae normal.  ?   Pupils: Pupils are equal, round, and reactive to light.  ?Neck:  ?   Thyroid: No thyromegaly.  ?Cardiovascular:  ?   Rate and Rhythm: Normal  rate and regular rhythm.  ?   Pulses: Normal pulses.  ?   Heart sounds: Normal heart sounds. No murmur heard. ?Pulmonary:  ?   Effort: Pulmonary effort is normal. No respiratory distress.  ?   Breath sounds: Normal breath sounds.  ?Abdominal:  ?   General: There is no distension.  ?   Palpations: Abdomen is soft.  ?   Tenderness: There is no abdominal tenderness.  ?Musculoskeletal:  ?   Cervical back: Normal range of motion and neck supple.  ?   Right lower leg: No edema.  ?   Left lower leg: No edema.  ?Lymphadenopathy:  ?   Cervical: No cervical adenopathy.  ?Skin: ?   General: Skin is warm and dry.  ?Neurological:  ?   General: No focal deficit present.  ?   Mental Status: She is alert and oriented to person, place, and time.  ?Psychiatric:     ?   Mood and Affect: Mood normal.     ?   Behavior: Behavior normal.  ? ? ? ? ? ?   ?Assessment & Plan:  ? ? ?

## 2021-08-30 NOTE — Patient Instructions (Addendum)
Follow up in 4-6 weeks to recheck [facility] ?We'll notify you of your lab results and make any changes if needed ?We'll call you with your Sleep Referral, GI referral, Urology referral ?Continue to work on low carb diet and regular physical activity- you can do it!! ?Call with any questions or concerns ?Hang in there!  We'll figure this out!!! ?

## 2021-08-31 LAB — CBC WITH DIFFERENTIAL/PLATELET
Basophils Absolute: 0.1 10*3/uL (ref 0.0–0.1)
Basophils Relative: 1.7 % (ref 0.0–3.0)
Eosinophils Absolute: 0.3 10*3/uL (ref 0.0–0.7)
Eosinophils Relative: 4 % (ref 0.0–5.0)
HCT: 38.3 % (ref 36.0–46.0)
Hemoglobin: 12.8 g/dL (ref 12.0–15.0)
Lymphocytes Relative: 33.2 % (ref 12.0–46.0)
Lymphs Abs: 2.1 10*3/uL (ref 0.7–4.0)
MCHC: 33.5 g/dL (ref 30.0–36.0)
MCV: 91.7 fl (ref 78.0–100.0)
Monocytes Absolute: 0.6 10*3/uL (ref 0.1–1.0)
Monocytes Relative: 9.3 % (ref 3.0–12.0)
Neutro Abs: 3.3 10*3/uL (ref 1.4–7.7)
Neutrophils Relative %: 51.8 % (ref 43.0–77.0)
Platelets: 324 10*3/uL (ref 150.0–400.0)
RBC: 4.17 Mil/uL (ref 3.87–5.11)
RDW: 13.3 % (ref 11.5–15.5)
WBC: 6.3 10*3/uL (ref 4.0–10.5)

## 2021-08-31 LAB — BASIC METABOLIC PANEL
BUN: 23 mg/dL (ref 6–23)
CO2: 30 mEq/L (ref 19–32)
Calcium: 9.8 mg/dL (ref 8.4–10.5)
Chloride: 101 mEq/L (ref 96–112)
Creatinine, Ser: 1.1 mg/dL (ref 0.40–1.20)
GFR: 54.43 mL/min — ABNORMAL LOW (ref >60.00)
Glucose, Bld: 103 mg/dL — ABNORMAL HIGH (ref 70–99)
Potassium: 4.5 mEq/L (ref 3.5–5.1)
Sodium: 140 mEq/L (ref 135–145)

## 2021-08-31 LAB — HEPATIC FUNCTION PANEL
ALT: 23 U/L (ref 0–35)
AST: 18 U/L (ref 0–37)
Albumin: 4.3 g/dL (ref 3.5–5.2)
Alkaline Phosphatase: 110 U/L (ref 39–117)
Bilirubin, Direct: 0.1 mg/dL (ref 0.0–0.3)
Total Bilirubin: 0.5 mg/dL (ref 0.2–1.2)
Total Protein: 6.9 g/dL (ref 6.0–8.3)

## 2021-08-31 LAB — LIPID PANEL
Cholesterol: 154 mg/dL (ref 0–200)
HDL: 50.1 mg/dL (ref >39.00)
LDL Cholesterol: 69 mg/dL (ref 0–99)
NonHDL: 104.36
Total CHOL/HDL Ratio: 3
Triglycerides: 179 mg/dL — ABNORMAL HIGH (ref 0.0–149.0)
VLDL: 35.8 mg/dL (ref 0.0–40.0)

## 2021-08-31 LAB — TSH: TSH: 3.02 u[IU]/mL (ref 0.35–5.50)

## 2021-09-02 NOTE — Assessment & Plan Note (Signed)
Pt reports she has seen multiple ENTs in the past and has not had any improvement/resolution in sxs.  She is not open to another referral.  Since she has chronic GERD will refer to GI for evaluation.  Pt expressed understanding and is in agreement w/ plan.  ?

## 2021-09-02 NOTE — Assessment & Plan Note (Signed)
Ongoing issue for pt.  Will restart Esomeprazole since she has been out of medication and having breakthrough symptoms.  Will also refer to GI as she has chronic hoarseness.  Pt expressed understanding and is in agreement w/ plan.  ?

## 2021-09-02 NOTE — Assessment & Plan Note (Signed)
Pt has hx of this.  Was supposed to have a sleep study but never followed thru.  Will refer for OSA evaluation.  Pt expressed understanding and is in agreement w/ plan.  ?

## 2021-09-02 NOTE — Assessment & Plan Note (Signed)
Chronic problem.  Pt reports she has been out of medication recently and has only been taking her Levothyroxine for the last 10 days.  Check labs and continue to monitor ?

## 2021-09-02 NOTE — Assessment & Plan Note (Signed)
Chronic problem.  Currently on Lipitor daily w/o difficulty.  Check labs.  Adjust meds prn  ?

## 2021-09-02 NOTE — Assessment & Plan Note (Signed)
Ongoing issue for pt.  BMI is now 46.13  She states she would like to lose 100 lbs but is not able to exercise due to stress incontinence.  Feels that weight is playing a large role in her depression- which causes her to emotionally eat, which worsens the situation.  Check labs to risk stratify.  Start [employer].  Discussed appropriate use, possible side effects, and need for continued work on healthy diet and regular physical activity.  Pt expressed understanding and is in agreement w/ plan.  ?

## 2021-09-02 NOTE — Assessment & Plan Note (Signed)
Chronic problem.  She states sxs have been worsening.  She is s/p hysterectomy so has little bladder support.  Will refer to urology as she has never had an evaluation.  Pt expressed understanding and is in agreement w/ plan.  ?

## 2021-09-02 NOTE — Assessment & Plan Note (Signed)
Ongoing issue for pt.  Currently on Wellbutrin and Celexa.  Pt reports she had a terrible relationship w/ her mother and w/ each passing year, she feels she looks more like mom and this is very triggering for her.  She is very depressed about her current weight and the depression actually worsens her emotional eating.  Rather than changing depression meds at this time, will try and fix the source- the obesity.  Will start [facility] and monitor closely.  Pt expressed understanding and is in agreement w/ plan.  ?

## 2021-09-03 ENCOUNTER — Telehealth: Payer: Self-pay

## 2021-09-03 NOTE — Telephone Encounter (Signed)
Spoke w/ Pt and advised her of lab results . Pt expressed verbal understanding  ?

## 2021-09-03 NOTE — Telephone Encounter (Signed)
-----   Message from Sheliah Hatch, MD sent at 09/02/2021 11:04 AM EDT ----- ?Labs look good!  No changes at this time ?

## 2021-09-10 ENCOUNTER — Other Ambulatory Visit: Payer: Self-pay | Admitting: Osteopathic Medicine

## 2021-09-20 ENCOUNTER — Telehealth: Payer: Self-pay

## 2021-09-20 ENCOUNTER — Encounter: Payer: Self-pay | Admitting: Family Medicine

## 2021-09-20 NOTE — Telephone Encounter (Signed)
A PA has been submitted thru Cover my meds for pt [employer]. Waiting on response

## 2021-10-04 ENCOUNTER — Ambulatory Visit: Payer: BC Managed Care – PPO | Admitting: Family Medicine

## 2021-10-08 ENCOUNTER — Encounter: Payer: Self-pay | Admitting: Primary Care

## 2021-10-08 ENCOUNTER — Ambulatory Visit (INDEPENDENT_AMBULATORY_CARE_PROVIDER_SITE_OTHER): Payer: BC Managed Care – PPO | Admitting: Primary Care

## 2021-10-08 VITALS — BP 128/76 | HR 78 | Temp 98.1°F | Ht 62.0 in | Wt 254.0 lb

## 2021-10-08 DIAGNOSIS — J309 Allergic rhinitis, unspecified: Secondary | ICD-10-CM | POA: Diagnosis not present

## 2021-10-08 DIAGNOSIS — R0683 Snoring: Secondary | ICD-10-CM | POA: Diagnosis not present

## 2021-10-08 DIAGNOSIS — J329 Chronic sinusitis, unspecified: Secondary | ICD-10-CM | POA: Diagnosis not present

## 2021-10-08 DIAGNOSIS — G4700 Insomnia, unspecified: Secondary | ICD-10-CM | POA: Diagnosis not present

## 2021-10-08 MED ORDER — CALCITRIOL 0.5 MCG PO CAPS: ORAL_CAPSULE | ORAL | 0 refills | Status: DC

## 2021-10-08 NOTE — Assessment & Plan Note (Addendum)
-   Patient has symptoms loud snoring and non-restorative sleep.  Epworth 11. BMI 46. Concern patient could have obstructive sleep apnea, needs home sleep study to evaluate. Discussed risk of untreated sleep apnea including cardiac arrhythmias, pulm HTN, stroke, DM. We briefly reviewed treatment options. Encouraged patient to work on weight loss efforts and focus on side sleeping position/elevate head of bed. Advised against driving if experiencing excessive daytime sleepiness. Follow-up in 4-6 weeks to review sleep study results and discuss treatment options further

## 2021-10-08 NOTE — Patient Instructions (Signed)
  Sleep apnea is defined as period of 10 seconds or longer when you stop breathing at night. This can happen multiple times a night. Dx sleep apnea is when this occurs more than 5 times an hour.    Mild OSA 5-15 apneic events an hour Moderate OSA 15-30 apneic events an hour Severe OSA > 30 apneic events an hour   Untreated sleep apnea puts you at higher risk for cardiac arrhythmias, pulmonary HTN, stroke and diabetes   Treatment options include weight loss, side sleeping position, oral appliance, CPAP therapy or referral to ENT for possible surgical options    Recommendations: Focus on side sleeping position Work on weight loss efforts  Do not drive if experiencing excessive daytime sleepiness of fatigue    Orders: Home sleep study re: loud snoring    Follow-up: Please call and schedule follow-up 1-2 weeks after completing home sleep study to review results and treatment if needed

## 2021-10-08 NOTE — Progress Notes (Signed)
Reviewed and agree with assessment/plan.   Coralyn Helling, MD [facility] Pulmonary/Critical Care 10/08/2021, 2:53 PM Pager:  [phone]

## 2021-10-08 NOTE — Assessment & Plan Note (Signed)
-   Seen by ENT in the past and no surgical options

## 2021-10-08 NOTE — Progress Notes (Signed)
@Patient  ID: , female    DOB: 07/10/1960, 61 y.o.   MRN: [id number]  Chief Complaint  Patient presents with   sleep consult    Pt states that she snores at night and when waking up in the morning does not feel rested.    Referring provider: [id number], MD  HPI: 61 year old female, former smoker quit in 2020. PMH significant for chronic allergic rhinitis, GERD, osteopenia, hyperlipidemia, chronic hoarseness, hypersomnolence, restless leg syndrome, the deficiency, obesity.  10/08/2021 Patient presents today for sleep consult.  She was seen by pulmonologist at [facility] back in December 2020 for sleep consult due to snoring and morning headaches.  She was ordered for split-night sleep study. She was not able to get testing done at that time. Her snoring symptoms have worsened over the last three years. She takes Ambien 10mg  at night for insomnia on average 4 times a month. She has chronic sinusitis and voice hoarseness, she has been seen by ENT in the past and there are no surgical options. She takes montelukast and cetirizine daily and will use saline nasal spray as needed. She is unable to use OTC nasal sprays such as Flonase or nasacort. She has been on oral prednisone in the past for sinusitis symptoms.   Sleep questionnaire Symptoms-  Loud snoring and non-restorative sleep  Prior sleep study- None Bedtime- 10-11pm Time to fall asleep- Depends; 15min-2 hours  Nocturnal awakenings- 3-4 times Out of bed/start of day- 6am Weight changes- Up 25 lbs  Do you operate heavy machinery- No Do you currently wear CPAP- No Do you current wear oxygen- No Epworth- 11  Allergies  Allergen Reactions   Flonase [Fluticasone] Nausea And Vomiting    Immunization History  Administered Date(s) Administered   Influenza Inj Mdck Quad Pf 01/16/2018   Influenza,inj,Quad PF,6+ Mos 01/28/2019, 03/13/2021   Influenza-Unspecified 03/08/2020   PFIZER Comirnaty(Gray Top)Covid-19  Tri-Sucrose Vaccine 06/23/2020   PFIZER(Purple Top)SARS-COV-2 Vaccination 07/15/2019, 08/09/2019   Tdap 08/13/2011    Past Medical History:  Diagnosis Date   Depression    GERD (gastroesophageal reflux disease)    Hyperlipidemia    Hyperparathyroidism    Osteopenia    Stress incontinence     Tobacco History: Social History   Tobacco Use  Smoking Status Former   Packs/day: 0.10   Years: 10.00   Total pack years: 1.00   Types: Cigarettes   Quit date: 07/27/2018   Years since quitting: 3.2  Smokeless Tobacco Never   Counseling given: Not Answered   Review of Systems  Review of Systems  Constitutional: Negative.   HENT:  Positive for postnasal drip.   Respiratory: Negative.    Cardiovascular: Negative.    Physical Exam  BP 128/76 (BP Location: Left Wrist, Patient Position: Sitting, Cuff Size: Normal)   Pulse 78   Temp 98.1 F (36.7 C) (Oral)   Ht 5\' 2"  (1.575 m)   Wt 254 lb (115.2 kg)   SpO2 97% Comment: RA  BMI 46.46 kg/m  Physical Exam Constitutional:      Appearance: Normal appearance.  HENT:     Head: Normocephalic and atraumatic.     Right Ear: Tympanic membrane normal. There is no impacted cerumen.     Left Ear: Tympanic membrane normal. There is no impacted cerumen.     Mouth/Throat:     Mouth: Mucous membranes are moist.     Pharynx: Oropharynx is clear.  Cardiovascular:     Rate and Rhythm: Normal rate and regular rhythm.  Pulmonary:     Effort: Pulmonary effort is normal.     Breath sounds: Normal breath sounds.  Musculoskeletal:        General: Normal range of motion.     Cervical back: Normal range of motion and neck supple.  Skin:    General: Skin is warm.  Neurological:     General: No focal deficit present.     Mental Status: She is alert and oriented to person, place, and time. Mental status is at baseline.  Psychiatric:        Mood and Affect: Mood normal.        Behavior: Behavior normal.        Thought Content: Thought content  normal.        Judgment: Judgment normal.      Lab Results:  CBC    Component Value Date/Time   WBC 6.3 08/30/2021 1529   RBC 4.17 08/30/2021 1529   HGB 12.8 08/30/2021 1529   HCT 38.3 08/30/2021 1529   PLT 324.0 08/30/2021 1529   MCV 91.7 08/30/2021 1529   MCH 30.6 05/30/2020 0000   MCHC 33.5 08/30/2021 1529   RDW 13.3 08/30/2021 1529   LYMPHSABS 2.1 08/30/2021 1529   MONOABS 0.6 08/30/2021 1529   EOSABS 0.3 08/30/2021 1529   BASOSABS 0.1 08/30/2021 1529    BMET    Component Value Date/Time   NA 140 08/30/2021 1529   K 4.5 08/30/2021 1529   CL 101 08/30/2021 1529   CO2 30 08/30/2021 1529   GLUCOSE 103 (H) 08/30/2021 1529   BUN 23 08/30/2021 1529   CREATININE 1.10 08/30/2021 1529   CREATININE 1.04 05/30/2020 0000   CALCIUM 9.8 08/30/2021 1529   CALCIUM 10.3 08/13/2011 0912   GFRNONAA 59 (L) 05/30/2020 0000   GFRAA 68 05/30/2020 0000    BNP No results found for: "BNP"  ProBNP No results found for: "PROBNP"  Imaging: No results found.   Assessment & Plan:   Loud snoring - Patient has symptoms loud snoring and non-restorative sleep.  Epworth 11. BMI 46. Concern patient could have obstructive sleep apnea, needs home sleep study to evaluate. Discussed risk of untreated sleep apnea including cardiac arrhythmias, pulm HTN, stroke, DM. We briefly reviewed treatment options. Encouraged patient to work on weight loss efforts and focus on side sleeping position/elevate head of bed. Advised against driving if experiencing excessive daytime sleepiness. Follow-up in 4-6 weeks to review sleep study results and discuss treatment options further  Chronic allergic rhinitis - Continue montelukast, cetirizine and ocean nasal spray  Chronic sinusitis - Seen by ENT in the past and no surgical options  Insomnia - Continue as needed Ambien 10 mg qhs, patient uses very rarely    Glenford Bayley, NP 10/08/2021

## 2021-10-08 NOTE — Assessment & Plan Note (Signed)
-   Continue montelukast, cetirizine and ocean nasal spray

## 2021-10-08 NOTE — Assessment & Plan Note (Signed)
-   Continue as needed Ambien 10 mg qhs, patient uses very rarely

## 2021-10-10 ENCOUNTER — Encounter: Payer: Self-pay | Admitting: Family Medicine

## 2021-10-10 ENCOUNTER — Encounter (INDEPENDENT_AMBULATORY_CARE_PROVIDER_SITE_OTHER): Payer: BC Managed Care – PPO | Admitting: Family Medicine

## 2021-10-10 DIAGNOSIS — R944 Abnormal results of kidney function studies: Secondary | ICD-10-CM

## 2021-10-10 MED ORDER — ROPINIROLE HCL 1 MG PO TABS: 1.0000 mg | ORAL_TABLET | Freq: Every day | ORAL | 3 refills | Status: DC

## 2021-10-10 MED ORDER — CETIRIZINE HCL 10 MG PO TABS: 10.0000 mg | ORAL_TABLET | Freq: Every day | ORAL | 3 refills | Status: DC

## 2021-10-10 MED ORDER — TIZANIDINE HCL 4 MG PO TABS: 4.0000 mg | ORAL_TABLET | Freq: Four times a day (QID) | ORAL | 0 refills | Status: DC | PRN

## 2021-10-10 MED ORDER — ZOLPIDEM TARTRATE 10 MG PO TABS: ORAL_TABLET | ORAL | 0 refills | Status: DC

## 2021-10-10 MED ORDER — CALCITRIOL 0.5 MCG PO CAPS: 1.5000 ug | ORAL_CAPSULE | Freq: Every day | ORAL | 1 refills | Status: DC

## 2021-10-11 ENCOUNTER — Other Ambulatory Visit: Payer: Self-pay

## 2021-10-11 ENCOUNTER — Telehealth: Payer: Self-pay

## 2021-10-11 DIAGNOSIS — F332 Major depressive disorder, recurrent severe without psychotic features: Secondary | ICD-10-CM

## 2021-10-11 MED ORDER — MONTELUKAST SODIUM 10 MG PO TABS: ORAL_TABLET | ORAL | 1 refills | Status: DC

## 2021-10-11 MED ORDER — CITALOPRAM HYDROBROMIDE 40 MG PO TABS: 40.0000 mg | ORAL_TABLET | Freq: Every day | ORAL | 1 refills | Status: DC

## 2021-10-11 MED ORDER — CETIRIZINE HCL 10 MG PO TABS: 10.0000 mg | ORAL_TABLET | Freq: Every day | ORAL | 3 refills | Status: DC

## 2021-10-11 MED ORDER — ZOLPIDEM TARTRATE 10 MG PO TABS: ORAL_TABLET | ORAL | 0 refills | Status: DC

## 2021-10-11 MED ORDER — ATORVASTATIN CALCIUM 40 MG PO TABS: 40.0000 mg | ORAL_TABLET | Freq: Every day | ORAL | 1 refills | Status: DC

## 2021-10-11 MED ORDER — ROPINIROLE HCL 1 MG PO TABS: 1.0000 mg | ORAL_TABLET | Freq: Every day | ORAL | 3 refills | Status: DC

## 2021-10-11 MED ORDER — LEVOTHYROXINE SODIUM 150 MCG PO TABS: ORAL_TABLET | ORAL | 1 refills | Status: DC

## 2021-10-11 MED ORDER — TIZANIDINE HCL 4 MG PO TABS: 4.0000 mg | ORAL_TABLET | Freq: Four times a day (QID) | ORAL | 0 refills | Status: DC | PRN

## 2021-10-11 MED ORDER — BUPROPION HCL ER (XL) 150 MG PO TB24: 150.0000 mg | ORAL_TABLET | Freq: Every day | ORAL | 1 refills | Status: DC

## 2021-10-11 MED ORDER — CALCITRIOL 0.5 MCG PO CAPS: 1.5000 ug | ORAL_CAPSULE | Freq: Every day | ORAL | 1 refills | Status: DC

## 2021-10-11 MED ORDER — ESOMEPRAZOLE MAGNESIUM 40 MG PO CPDR: 40.0000 mg | DELAYED_RELEASE_CAPSULE | Freq: Two times a day (BID) | ORAL | 3 refills | Status: DC

## 2021-10-11 NOTE — Telephone Encounter (Signed)
Prescriptions sent to [facility] pharmacy as requested

## 2021-10-23 ENCOUNTER — Encounter: Payer: Self-pay | Admitting: Gastroenterology

## 2021-10-29 NOTE — Telephone Encounter (Signed)
Pt has questions about lab results.

## 2021-10-31 DIAGNOSIS — R944 Abnormal results of kidney function studies: Secondary | ICD-10-CM

## 2021-10-31 NOTE — Telephone Encounter (Signed)
[facility] VISIT   Patient agreed to [facility] visit and is aware that copayment and coinsurance may apply. Patient was treated using telemedicine according to accepted telemedicine protocols.  Subjective:   Patient complains of Obesity, decreasing GFR levels  Patient Active Problem List   Diagnosis Date Noted   Chronic hoarseness 08/30/2021   Trichomoniasis Dx/Tx 09/2020 10/12/2020   Non-restorative sleep 03/01/2019   Change in voice 11/25/2017   Chronic sinusitis 11/25/2017   Osteopenia 11/25/2017   Hypersomnolence 11/25/2017   HNP (herniated nucleus pulposus), lumbar 03/07/2017   Back pain 03/05/2017   Intractable low back pain 03/04/2017   At risk for obstructive sleep apnea 02/19/2017   Loud snoring 02/19/2017   Encounter for long-term (current) use of medications 02/19/2017   History of hyperparathyroidism 11/06/2016   Chronic allergic rhinitis 11/06/2016   Vitamin D deficiency 11/06/2016   Insomnia 11/06/2016   History of osteopenia 11/06/2016   Elevated alanine aminotransferase (ALT) level 04/17/2016   Severe episode of recurrent major depressive disorder, without psychotic features (HCC) 04/15/2016   Post-operative hypothyroidism 04/15/2016   Encounter for monitoring statin therapy 05/19/2015   Morbid obesity due to excess calories (HCC) 05/19/2015   Hyperlipidemia 08/13/2011   Stress incontinence 08/13/2011   Weight gain 01/29/2011   GERD (gastroesophageal reflux disease) 05/04/2010   RESTLESS LEG SYNDROME 03/30/2010   SKIN LESIONS, MULTIPLE 11/22/2008   Chronic depression 11/04/2008   Social History   Tobacco Use   Smoking status: Former    Packs/day: 0.10    Years: 10.00    Total pack years: 1.00    Types: Cigarettes    Quit date: 07/27/2018    Years since quitting: 3.2   Smokeless tobacco: Never  Substance Use Topics   Alcohol use: Yes    Comment: social-wine    Current Outpatient Medications:    atorvastatin (LIPITOR) 40 MG tablet, Take 1 tablet (40  mg total) by mouth at bedtime., Disp: 90 tablet, Rfl: 1   buPROPion (WELLBUTRIN XL) 150 MG 24 hr tablet, Take 1 tablet (150 mg total) by mouth daily., Disp: 90 tablet, Rfl: 1   buPROPion (WELLBUTRIN XL) 300 MG 24 hr tablet, Take 1 tablet (300 mg total) by mouth daily., Disp: 90 tablet, Rfl: 1   calcitRIOL (ROCALTROL) 0.5 MCG capsule, Take 3 capsules (1.5 mcg total) by mouth daily., Disp: 270 capsule, Rfl: 1   cetirizine (ZYRTEC) 10 MG tablet, Take 1 tablet (10 mg total) by mouth daily., Disp: 90 tablet, Rfl: 3   citalopram (CELEXA) 40 MG tablet, Take 1 tablet (40 mg total) by mouth daily., Disp: 90 tablet, Rfl: 1   esomeprazole (NEXIUM) 40 MG capsule, Take 1 capsule (40 mg total) by mouth 2 (two) times daily before a meal., Disp: 180 capsule, Rfl: 3   levothyroxine (SYNTHROID) 150 MCG tablet, TAKE 1 TABLET BY MOUTH DAILY BEFORE AND BREAKFAST, Disp: 90 tablet, Rfl: 1   montelukast (SINGULAIR) 10 MG tablet, TAKE 1 TABLET(10 MG) BY MOUTH AT BEDTIME, Disp: 90 tablet, Rfl: 1   rOPINIRole (REQUIP) 1 MG tablet, Take 1 tablet (1 mg total) by mouth at bedtime., Disp: 90 tablet, Rfl: 3   tiZANidine (ZANAFLEX) 4 MG tablet, Take 1 tablet (4 mg total) by mouth every 6 (six) hours as needed for muscle spasms., Disp: 90 tablet, Rfl: 0   zolpidem (AMBIEN) 10 MG tablet, TAKE 1 TABLET(10 MG) BY MOUTH AT BEDTIME AS NEEDED, Disp: 90 tablet, Rfl: 0  Allergies  Allergen Reactions   Flonase [Fluticasone] Nausea And Vomiting  Assessment and Plan:   Diagnosis: Obesity, decreased GFR. Please see myChart communication and orders below.   Orders Placed This Encounter  Procedures   Basic metabolic panel   No orders of the defined types were placed in this encounter.   Neena Rhymes, MD 10/31/2021  A total of 7 minutes were spent by me to personally review the patient-generated inquiry, review patient records and data pertinent to assessment of the patient's problem, develop a management plan including  generation of prescriptions and/or orders, and on subsequent communication with the patient through secure the MyChart portal service.   There is no separately reported E/M service related to this service in the past 7 days nor does the patient have an upcoming soonest available appointment for this issue. This work was completed in less than 7 days.   The patient consented to this service today (see patient agreement prior to ongoing communication). Patient counseled regarding the need for in-person exam for certain conditions and was advised to call the office if any changing or worsening symptoms occur.   The codes to be used for the E/M service are: [x]   99421 for 5-10 minutes of time spent on the inquiry. []   for 11-20 minutes. []   for 21+ minutes.

## 2021-11-29 ENCOUNTER — Ambulatory Visit (INDEPENDENT_AMBULATORY_CARE_PROVIDER_SITE_OTHER): Payer: BC Managed Care – PPO | Admitting: Gastroenterology

## 2021-11-29 ENCOUNTER — Encounter: Payer: Self-pay | Admitting: Gastroenterology

## 2021-11-29 VITALS — BP 150/100 | HR 86 | Ht 62.0 in | Wt 251.0 lb

## 2021-11-29 DIAGNOSIS — K219 Gastro-esophageal reflux disease without esophagitis: Secondary | ICD-10-CM | POA: Diagnosis not present

## 2021-11-29 DIAGNOSIS — R490 Dysphonia: Secondary | ICD-10-CM | POA: Diagnosis not present

## 2021-11-29 NOTE — Progress Notes (Signed)
Referring Provider: Sheliah Hatch, MD Primary Care Physician:  Sheliah Hatch, MD   Reason for Consultation:  Hoarseness and reflux   IMPRESSION:  Hoarseness not explained by prior evaluation with ENT    - Records at [facility] and with Dr. Ezzard Standing reviewed    - no prior laryngoscopy Longstanding history of reflux previously treated with PPI Altered bowel habits BMI 45.91 with recent 60 pounds weight gain Cologuard for colon cancer screening    - no interest in proceeding with colonoscopy for colon cancer screening   She declined medication changes today and is more interested in knowing the cause of her symptoms  PLAN: - Reflux lifestyle modifications - Continue current medications - EGD with biopsies - Low threshold to follow-up with ENT re: possible LPR    HPI: Allison Reyes is a 61 y.o. female referred by Dr. Beverely Low for further evaluation of chronic hoarseness and reflux.  The history is obtained through the patient and review of her electronic health record.  She has a history of depression, hypercholesterolemia, hypoparathyroidism, hypothyroidism, obesity, urinary tract infections, irritable bowel syndrome diagnosed 20 years ago, stress incontinence, and ulcers in the GI tract at age 29. She also reports a history of endometriosis in her 30s, now with significant adhesions after hysterectomy.  She works as an Research scientist (medical).    History of Nexium 40 BID for 25 years for acid reflux Trial of OTC medications was unsuccessful in controlling her reflux Now with 3 years of dysphonia and a dry throat in the setting of both reflux and allergies Wakes up at night with a severe dry throat Also reports bloating and a change in bowel habits If she misses a dose of PPI she has severe brash Some progression of symptoms with concurrent 60 pound weight gain She is concerned because her brother had similar voice changes and died from a ruptured esophagus due to  chronic reflux  Previously seen by ENT (Dr. Ezzard Standing and Dr. Christell Constant at [facility]) chronic sinusitis and dysphonia and pulmonary, neither of whom were able to help with the dysphonia. She takes montelukast and cetirizine daily and will use saline nasal spray as needed. She is unable to use OTC nasal sprays such as Flonase or nasacort. She has been on oral prednisone in the past for sinusitis symptoms.  She will have a large, loose bowel movement 6 out of 7 days. Symptoms started 3 years ago when she started drinking coffee. She finds the volume a larger volume than expected and out of proportion to the amount of food that she eats.  Prior history of constipation.   Colonoscopy x 3 in [place] many years ago Cologuard negative 09/11/19  No recent abdominal imaging.   Mother has a history of inflammatory bowel disease.  There is no known family history of colon cancer or polyps. No family history of stomach cancer or other GI malignancy. No family history of celiac.   2 alcoholic drinks per day, 2 cups of coffee daily, prior smoker.  No use of illicit street drugs or smokeless tobacco.   Past Medical History:  Diagnosis Date   Depression    GERD (gastroesophageal reflux disease)    Hyperlipidemia    Hyperparathyroidism    Osteopenia    Stress incontinence     Past Surgical History:  Procedure Laterality Date   ABDOMINAL HYSTERECTOMY     TAH - endometriosis   LAPAROSCOPIC ENDOMETRIOSIS FULGURATION     LAPAROTOMY     LUMBAR  LAMINECTOMY/DECOMPRESSION MICRODISCECTOMY Left 03/07/2017   Procedure: L4-5 Discectomy;  Surgeon: Coletta Memos, MD;  Location: [facility] OR;  Service: Neurosurgery;  Laterality: Left;   NASAL SINUS SURGERY  01/03/11   OOPHORECTOMY     THYROIDECTOMY, PARTIAL       Current Outpatient Medications  Medication Sig Dispense Refill   atorvastatin (LIPITOR) 40 MG tablet Take 1 tablet (40 mg total) by mouth at bedtime. 90 tablet 1   buPROPion (WELLBUTRIN XL) 150 MG 24 hr  tablet Take 1 tablet (150 mg total) by mouth daily. 90 tablet 1   calcitRIOL (ROCALTROL) 0.5 MCG capsule Take 3 capsules (1.5 mcg total) by mouth daily. 270 capsule 1   cetirizine (ZYRTEC) 10 MG tablet Take 1 tablet (10 mg total) by mouth daily. 90 tablet 3   citalopram (CELEXA) 40 MG tablet Take 1 tablet (40 mg total) by mouth daily. 90 tablet 1   esomeprazole (NEXIUM) 40 MG capsule Take 1 capsule (40 mg total) by mouth 2 (two) times daily before a meal. 180 capsule 3   levothyroxine (SYNTHROID) 150 MCG tablet TAKE 1 TABLET BY MOUTH DAILY BEFORE AND BREAKFAST 90 tablet 1   montelukast (SINGULAIR) 10 MG tablet TAKE 1 TABLET(10 MG) BY MOUTH AT BEDTIME 90 tablet 1   rOPINIRole (REQUIP) 1 MG tablet Take 1 tablet (1 mg total) by mouth at bedtime. 90 tablet 3   tiZANidine (ZANAFLEX) 4 MG tablet Take 1 tablet (4 mg total) by mouth every 6 (six) hours as needed for muscle spasms. 90 tablet 0   zolpidem (AMBIEN) 10 MG tablet TAKE 1 TABLET(10 MG) BY MOUTH AT BEDTIME AS NEEDED 90 tablet 0   buPROPion (WELLBUTRIN XL) 300 MG 24 hr tablet Take 1 tablet (300 mg total) by mouth daily. 90 tablet 1   No current facility-administered medications for this visit.    Allergies as of 11/29/2021 - Review Complete 11/29/2021  Allergen Reaction Noted   Flonase [fluticasone] Nausea And Vomiting 06/26/2020    Family History  Problem Relation Age of Onset   Coronary artery disease Mother    Hypertension Mother    Alcohol abuse Mother    Depression Mother    Hypertension Father    Lung cancer Father    Stroke Paternal Grandfather    Diabetes Neg Hx    Colon cancer Neg Hx    Breast cancer Neg Hx      Review of Systems: 12 system ROS is negative except as noted above with the addition of allergies, back pain, cough, depression, fatigue, itching, muscle pains and cramps, night sweats, shortness of breath, sore throat, excessive thirst, excessive urination, urinary leakage.   Physical Exam: General:   Alert,   well-nourished, pleasant and cooperative in NAD Head:  Normocephalic and atraumatic. Eyes:  Sclera clear, no icterus.   Conjunctiva pink. Ears:  Normal auditory acuity. Nose:  No deformity, discharge,  or lesions. Mouth:  No deformity or lesions.   Neck:  Supple; no masses or thyromegaly. Lungs:  Clear throughout to auscultation.   No wheezes. Heart:  Regular rate and rhythm; no murmurs. Abdomen:  Soft, nontender, nondistended, normal bowel sounds, no rebound or guarding. No hepatosplenomegaly.   Rectal:  Deferred  Msk:  Symmetrical. No boney deformities LAD: No inguinal or umbilical LAD Extremities:  No clubbing or edema. Neurologic:  Alert and  oriented x4;  grossly nonfocal Skin:  Intact without significant lesions or rashes. Psych:  Alert and cooperative. Normal mood and affect.    Brena Windsor L. Jonisha Kindig,  MD, MPH 12/02/2021, 8:48 PM

## 2021-11-29 NOTE — Patient Instructions (Addendum)
You have been scheduled for an endoscopy. Please follow written instructions given to you at your visit today. If you use inhalers (even only as needed), please bring them with  you on the day of your procedure.     Please follow antireflux measures/lifestyle modifications (see attached info).  ______________________________________________________  If you are age 61 or older, your body mass index should be between 23-30. Your Body mass index is 45.91 kg/m. If this is out of the aforementioned range listed, please consider follow up with your Primary Care Provider.  If you are age 66 or younger, your body mass index should be between 19-25. Your Body mass index is 45.91 kg/m. If this is out of the aformentioned range listed, please consider follow up with your Primary Care Provider.   ________________________________________________________  The [place] [facility] providers would like to encourage you to use [facility] to communicate with providers for non-urgent requests or questions.  Due to long hold times on the telephone, sending your provider a message by [facility] may be a faster and more efficient way to get a response.  Please allow 48 business hours for a response.  Please remember that this is for non-urgent requests.  _______________________________________________________  Due to recent changes in healthcare laws, you may see the results of your imaging and laboratory studies on MyChart before your provider has had a chance to review them.  We understand that in some cases there may be results that are confusing or concerning to you. Not all laboratory results come back in the same time frame and the provider may be waiting for multiple results in order to interpret others.  Please give [place] 48 hours in order for your provider to thoroughly review all the results before contacting the office for clarification of your results.

## 2021-11-30 ENCOUNTER — Other Ambulatory Visit: Payer: Self-pay

## 2021-11-30 DIAGNOSIS — F332 Major depressive disorder, recurrent severe without psychotic features: Secondary | ICD-10-CM

## 2021-11-30 MED ORDER — BUPROPION HCL ER (XL) 300 MG PO TB24: 300.0000 mg | ORAL_TABLET | Freq: Every day | ORAL | 1 refills | Status: DC

## 2021-12-02 ENCOUNTER — Encounter: Payer: Self-pay | Admitting: Gastroenterology

## 2021-12-04 ENCOUNTER — Encounter: Payer: BC Managed Care – PPO | Admitting: Gastroenterology

## 2021-12-05 ENCOUNTER — Ambulatory Visit: Payer: BC Managed Care – PPO

## 2021-12-05 DIAGNOSIS — G4733 Obstructive sleep apnea (adult) (pediatric): Secondary | ICD-10-CM | POA: Diagnosis not present

## 2021-12-05 DIAGNOSIS — R0683 Snoring: Secondary | ICD-10-CM

## 2021-12-09 ENCOUNTER — Encounter: Payer: Self-pay | Admitting: Certified Registered Nurse Anesthetist

## 2021-12-17 ENCOUNTER — Encounter: Payer: Self-pay | Admitting: Gastroenterology

## 2021-12-17 ENCOUNTER — Ambulatory Visit (AMBULATORY_SURGERY_CENTER): Payer: BC Managed Care – PPO | Admitting: Gastroenterology

## 2021-12-17 VITALS — BP 124/64 | HR 66 | Temp 98.4°F | Resp 10 | Ht 62.0 in | Wt 251.0 lb

## 2021-12-17 DIAGNOSIS — R490 Dysphonia: Secondary | ICD-10-CM | POA: Diagnosis not present

## 2021-12-17 DIAGNOSIS — K209 Esophagitis, unspecified without bleeding: Secondary | ICD-10-CM

## 2021-12-17 DIAGNOSIS — G4733 Obstructive sleep apnea (adult) (pediatric): Secondary | ICD-10-CM | POA: Diagnosis not present

## 2021-12-17 DIAGNOSIS — K259 Gastric ulcer, unspecified as acute or chronic, without hemorrhage or perforation: Secondary | ICD-10-CM

## 2021-12-17 DIAGNOSIS — K2100 Gastro-esophageal reflux disease with esophagitis, without bleeding: Secondary | ICD-10-CM

## 2021-12-17 DIAGNOSIS — K219 Gastro-esophageal reflux disease without esophagitis: Secondary | ICD-10-CM

## 2021-12-17 DIAGNOSIS — K317 Polyp of stomach and duodenum: Secondary | ICD-10-CM | POA: Diagnosis not present

## 2021-12-17 DIAGNOSIS — K2970 Gastritis, unspecified, without bleeding: Secondary | ICD-10-CM | POA: Diagnosis not present

## 2021-12-17 MED ORDER — SODIUM CHLORIDE 0.9 % IV SOLN: 500.0000 mL | Freq: Once | INTRAVENOUS | Status: DC

## 2021-12-17 NOTE — Progress Notes (Signed)
Called to room to assist during endoscopic procedure.  Patient ID and intended procedure confirmed with present staff. Received instructions for my participation in the procedure from the performing physician.  

## 2021-12-17 NOTE — Patient Instructions (Signed)
Handouts given for esophagitis and gastritis.  YOU HAD AN ENDOSCOPIC PROCEDURE TODAY AT THE [facility]:   Refer to the procedure report that was given to you for any specific questions about what was found during the examination.  If the procedure report does not answer your questions, please call your gastroenterologist to clarify.  If you requested that your care partner not be given the details of your procedure findings, then the procedure report has been included in a sealed envelope for you to review at your convenience later.  YOU SHOULD EXPECT: Some feelings of bloating in the abdomen. Passage of more gas than usual.  Walking can help get rid of the air that was put into your GI tract during the procedure and reduce the bloating. If you had a lower endoscopy (such as a colonoscopy or flexible sigmoidoscopy) you may notice spotting of blood in your stool or on the toilet paper. If you underwent a bowel prep for your procedure, you may not have a normal bowel movement for a few days.  Please Note:  You might notice some irritation and congestion in your nose or some drainage.  This is from the oxygen used during your procedure.  There is no need for concern and it should clear up in a day or so.  SYMPTOMS TO REPORT IMMEDIATELY:  Following upper endoscopy (EGD)  Vomiting of blood or coffee ground material  New chest pain or pain under the shoulder blades  Painful or persistently difficult swallowing  New shortness of breath  Fever of 100F or higher  Black, tarry-looking stools  For urgent or emergent issues, a gastroenterologist can be reached at any hour by calling [phone]. Do not use MyChart messaging for urgent concerns.    DIET:  We do recommend a small meal at first, but then you may proceed to your regular diet.  Drink plenty of fluids but you should avoid alcoholic beverages for 24 hours.  ACTIVITY:  You should plan to take it easy for the rest of today and  you should NOT DRIVE or use heavy machinery until tomorrow (because of the sedation medicines used during the test).    FOLLOW UP: Our staff will call the number listed on your records the next business day following your procedure.  We will call around 7:15- 8:00 am to check on you and address any questions or concerns that you may have regarding the information given to you following your procedure. If we do not reach you, we will leave a message.  If you develop any symptoms (ie: fever, flu-like symptoms, shortness of breath, cough etc.) before then, please call [phone].  If you test positive for Covid 19 in the 2 weeks post procedure, please call and report this information to [place].    If any biopsies were taken you will be contacted by phone or by letter within the next 1-3 weeks.  Please call us at [phone] if you have not heard about the biopsies in 3 weeks.    SIGNATURES/CONFIDENTIALITY: You and/or your care partner have signed paperwork which will be entered into your electronic medical record.  These signatures attest to the fact that that the information above on your After Visit Summary has been reviewed and is understood.  Full responsibility of the confidentiality of this discharge information lies with you and/or your care-partner.

## 2021-12-17 NOTE — Progress Notes (Unsigned)
Pt's states no medical or surgical changes since previsit or office visit. 

## 2021-12-17 NOTE — Op Note (Signed)
[facility] Patient Name: Allison Reyes Procedure Date: 12/17/2021 4:11 PM MRN: [id number] Endoscopist: Tressia Danas MD, MD Age: 61 Referring MD:  Date of Birth: 01-Jan-1961 Gender: Female Account #: [id number] Procedure:                Upper GI endoscopy Indications:              Chronic pharyngitis Medicines:                Monitored Anesthesia Care Procedure:                Pre-Anesthesia Assessment:                           - Prior to the procedure, a History and Physical                            was performed, and patient medications and                            allergies were reviewed. The patient's tolerance of                            previous anesthesia was also reviewed. The risks                            and benefits of the procedure and the sedation                            options and risks were discussed with the patient.                            All questions were answered, and informed consent                            was obtained. Prior Anticoagulants: The patient has                            taken no previous anticoagulant or antiplatelet                            agents. ASA Grade Assessment: III - A patient with                            severe systemic disease. After reviewing the risks                            and benefits, the patient was deemed in                            satisfactory condition to undergo the procedure.                           After obtaining informed consent, the endoscope was  passed under direct vision. Throughout the                            procedure, the patient's blood pressure, pulse, and                            oxygen saturations were monitored continuously. The                            Endoscope was introduced through the mouth, and                            advanced to the third part of duodenum. The upper                            GI endoscopy was  accomplished without difficulty.                            The patient tolerated the procedure well. Scope In: Scope Out: Findings:                 LA Grade A (one or more mucosal breaks less than 5                            mm, not extending between tops of 2 mucosal folds)                            esophagitis with no bleeding was found 38 cm from                            the incisors. Biopsies were taken from the                            mid/proximal and distal esophagus with a cold                            forceps for histology. Estimated blood loss was                            minimal.                           Multiple localized small erosions with no bleeding                            and no stigmata of recent bleeding were found in                            the gastric body and in the gastric antrum.                            Biopsies were taken from the antrum, body, and  fundus with a cold forceps for histology. Estimated                            blood loss was minimal.                           The examined duodenum was normal.                           The cardia and gastric fundus were normal on                            retroflexion.                           The exam was otherwise without abnormality. Complications:            No immediate complications. Estimated Blood Loss:     Estimated blood loss was minimal. Impression:               - LA Grade A reflux esophagitis with no bleeding.                            Biopsied.                           - Erosive gastropathy with no bleeding and no                            stigmata of recent bleeding. Biopsied.                           - Normal examined duodenum.                           - The examination was otherwise normal. Recommendation:           - Patient has a contact number available for                            emergencies. The signs and symptoms of potential                             delayed complications were discussed with the                            patient. Return to normal activities tomorrow.                            Written discharge instructions were provided to the                            patient.                           - Resume previous diet.                           -  Continue present medications.                           - Await pathology results.                           - No aspirin, ibuprofen, naproxen, or other                            non-steroidal anti-inflammatory drugs. Thornton Park MD, MD 12/17/2021 4:45:46 PM This report has been signed electronically.

## 2021-12-17 NOTE — Progress Notes (Unsigned)
Report given to PACU, vss 

## 2021-12-17 NOTE — Progress Notes (Unsigned)
1618 Robinul 0.1 mg IV given due large amount of secretions upon assessment.  MD made aware, vss  

## 2021-12-17 NOTE — Progress Notes (Unsigned)
Indication for upper endoscopy: Hoarseness  Please see my 8/23 office note for complete details.  There is been no change in history or physical exam.  The patient remains an appropriate candidate for monitored anesthesia care in the endoscopy.

## 2021-12-18 ENCOUNTER — Telehealth: Payer: Self-pay | Admitting: *Deleted

## 2021-12-18 NOTE — Telephone Encounter (Signed)
  Follow up Call-     12/17/2021    3:14 PM  Call back number  Post procedure Call Back phone  # [phone]  Permission to leave phone message Yes     Patient questions:  Do you have a fever, pain , or abdominal swelling? No. Pain Score  0 *  Have you tolerated food without any problems? Yes.    Have you been able to return to your normal activities? Yes.    Do you have any questions about your discharge instructions: Diet   No. Medications  No. Follow up visit  No.  Do you have questions or concerns about your Care? No.  Actions: * If pain score is 4 or above: No action needed, pain <4.

## 2022-01-11 ENCOUNTER — Ambulatory Visit: Payer: BC Managed Care – PPO | Admitting: Primary Care

## 2022-01-16 ENCOUNTER — Ambulatory Visit (INDEPENDENT_AMBULATORY_CARE_PROVIDER_SITE_OTHER): Payer: BC Managed Care – PPO | Admitting: Primary Care

## 2022-01-16 ENCOUNTER — Encounter: Payer: Self-pay | Admitting: Primary Care

## 2022-01-16 VITALS — BP 124/84 | HR 71 | Temp 98.2°F | Ht 62.0 in | Wt 254.0 lb

## 2022-01-16 DIAGNOSIS — J309 Allergic rhinitis, unspecified: Secondary | ICD-10-CM

## 2022-01-16 DIAGNOSIS — R052 Subacute cough: Secondary | ICD-10-CM | POA: Diagnosis not present

## 2022-01-16 DIAGNOSIS — R0683 Snoring: Secondary | ICD-10-CM

## 2022-01-16 DIAGNOSIS — K219 Gastro-esophageal reflux disease without esophagitis: Secondary | ICD-10-CM

## 2022-01-16 DIAGNOSIS — J329 Chronic sinusitis, unspecified: Secondary | ICD-10-CM | POA: Diagnosis not present

## 2022-01-16 DIAGNOSIS — G4733 Obstructive sleep apnea (adult) (pediatric): Secondary | ICD-10-CM | POA: Diagnosis not present

## 2022-01-16 MED ORDER — PREDNISONE 10 MG PO TABS: ORAL_TABLET | ORAL | 0 refills | Status: DC

## 2022-01-16 NOTE — Patient Instructions (Addendum)
Sleep study showed that you have severe obstructive sleep apnea Due to severity of OSA recommend you be started on CPAP Continue to use wedge pillow   Recommendations - Start over the counter delsym cough syrup every 12 hours for cough suppression - Continue Nexium twice daily before meals - Take prednisone taper as directed - Avoid coughing as much as possible, use sips of water's and sugar-free lozenge orders to prevent throat clearing - Notify us if cough does not improve and we will get additional testing  Orders - New CPAP start 5 to 20 cm H2O with mask of choice  RX - Prednisone taper (sent)  Follow-up - Pease call to schedule FU 31 to 90 days after starting CPAP therapy for compliance check

## 2022-01-16 NOTE — Progress Notes (Signed)
@Patient  ID: , female    DOB: 20-Mar-1961, 61 y.o.   MRN: [id number]  Chief Complaint  Patient presents with   Follow-up    Referring provider: [id number], MD  HPI: 61 year old female, former smoker quit in 2020. PMH significant for chronic allergic rhinitis, GERD, osteopenia, hyperlipidemia, chronic hoarseness, hypersomnolence, restless leg syndrome, the deficiency, obesity.  10/08/2021 Patient presents today for sleep consult.  She was seen by pulmonologist at [facility] back in December 2020 for sleep consult due to snoring and morning headaches.  She was ordered for split-night sleep study. She was not able to get testing done at that time. Her snoring symptoms have worsened over the last three years. She takes Ambien 10mg  at night for insomnia on average 4 times a month. She has chronic sinusitis and voice hoarseness, she has been seen by ENT in the past and there are no surgical options. She takes montelukast and cetirizine daily and will use saline nasal spray as needed. She is unable to use OTC nasal sprays such as Flonase or nasacort. She has been on oral prednisone in the past for sinusitis symptoms.   Sleep questionnaire Symptoms-  Loud snoring and non-restorative sleep  Prior sleep study- None Bedtime- 10-11pm Time to fall asleep- Depends; 37min-2 hours  Nocturnal awakenings- 3-4 times Out of bed/start of day- 6am Weight changes- Up 25 lbs  Do you operate heavy machinery- No Do you currently wear CPAP- No Do you current wear oxygen- No [facility]- 11  01/16/2022 - Interim hx  Patient contacted today to review sleep study results. Patient has symptoms of loud snoring, restless sleep and non-restorative sleep. She has home sleep test on 12/05/21 that showed severe sleep apnea, AHI 58.7/hour with SpO2 low 72%. She has a total of 389 apnea and hypopnea events (145 obstructive, 35 central and 50 mixed). Treatment options include weight loss, oral appliance, CPAP or  referral to ENT for possible surgical options. Due to severity of sleep apnea, recommending patient he tried on auto CPAP first. She has since bought a wedge pillow and her husband has noticed her snoring has improved. She remains very tired during the day.  She has had a persistent dry cough that started several months ago. She has some associated chest tightness with cough. No PND symptoms. She feels dried out. She has tried saline nasal spray. Tessalone perles did not help. She has responeded to steroids in the past when she has had bronchitis. She is taking singulair 10mg  at bedtime. She saw GI for hoarseness, she is on nexium twice a day.   Allergies  Allergen Reactions   Flonase [Fluticasone] Nausea And Vomiting    Immunization History  Administered Date(s) Administered   Influenza Inj Mdck Quad Pf 01/16/2018   Influenza,inj,Quad PF,6+ Mos 01/28/2019, 03/13/2021   Influenza-Unspecified 03/08/2020   PFIZER Comirnaty(Gray Top)Covid-19 Tri-Sucrose Vaccine 06/23/2020   PFIZER(Purple Top)SARS-COV-2 Vaccination 07/15/2019, 08/09/2019   Tdap 08/13/2011    Past Medical History:  Diagnosis Date   Depression    GERD (gastroesophageal reflux disease)    Hyperlipidemia    Hyperparathyroidism    Osteopenia    Sleep apnea    Stress incontinence     Tobacco History: Social History   Tobacco Use  Smoking Status Former   Packs/day: 0.10   Years: 10.00   Total pack years: 1.00   Types: Cigarettes   Quit date: 07/27/2018   Years since quitting: 3.5  Smokeless Tobacco Never   Counseling given: Not Answered  Outpatient Medications Prior to Visit  Medication Sig Dispense Refill   atorvastatin (LIPITOR) 40 MG tablet Take 1 tablet (40 mg total) by mouth at bedtime. 90 tablet 1   buPROPion (WELLBUTRIN XL) 150 MG 24 hr tablet Take 1 tablet (150 mg total) by mouth daily. 90 tablet 1   buPROPion (WELLBUTRIN XL) 300 MG 24 hr tablet Take 1 tablet (300 mg total) by mouth daily. 90 tablet 1    calcitRIOL (ROCALTROL) 0.5 MCG capsule Take 3 capsules (1.5 mcg total) by mouth daily. 270 capsule 1   cetirizine (ZYRTEC) 10 MG tablet Take 1 tablet (10 mg total) by mouth daily. 90 tablet 3   citalopram (CELEXA) 40 MG tablet Take 1 tablet (40 mg total) by mouth daily. 90 tablet 1   levothyroxine (SYNTHROID) 150 MCG tablet TAKE 1 TABLET BY MOUTH DAILY BEFORE AND BREAKFAST 90 tablet 1   montelukast (SINGULAIR) 10 MG tablet TAKE 1 TABLET(10 MG) BY MOUTH AT BEDTIME 90 tablet 1   rOPINIRole (REQUIP) 1 MG tablet Take 1 tablet (1 mg total) by mouth at bedtime. 90 tablet 3   tiZANidine (ZANAFLEX) 4 MG tablet Take 1 tablet (4 mg total) by mouth every 6 (six) hours as needed for muscle spasms. 90 tablet 0   zolpidem (AMBIEN) 10 MG tablet TAKE 1 TABLET(10 MG) BY MOUTH AT BEDTIME AS NEEDED 90 tablet 0   esomeprazole (NEXIUM) 40 MG capsule Take 1 capsule (40 mg total) by mouth 2 (two) times daily before a meal. 180 capsule 3   No facility-administered medications prior to visit.   Review of Systems  Review of Systems  Constitutional:  Positive for fatigue.  HENT: Negative.    Respiratory:  Positive for apnea and cough.    Physical Exam  BP 124/84 (BP Location: Right Arm, Patient Position: Sitting, Cuff Size: Large)   Pulse 71   Temp 98.2 F (36.8 C) (Oral)   Ht 5\' 2"  (1.575 m)   Wt 254 lb (115.2 kg)   SpO2 99%   BMI 46.46 kg/m  Physical Exam Constitutional:      Appearance: Normal appearance.  HENT:     Head: Normocephalic and atraumatic.     Mouth/Throat:     Mouth: Mucous membranes are moist.     Pharynx: Oropharynx is clear.     Comments: Mallampati class I-II Cardiovascular:     Rate and Rhythm: Normal rate and regular rhythm.  Pulmonary:     Effort: Pulmonary effort is normal.     Breath sounds: Normal breath sounds. No wheezing, rhonchi or rales.  Musculoskeletal:     Cervical back: Normal range of motion and neck supple.  Skin:    General: Skin is warm and dry.   Neurological:     General: No focal deficit present.     Mental Status: She is alert and oriented to person, place, and time. Mental status is at baseline.  Psychiatric:        Mood and Affect: Mood normal.        Behavior: Behavior normal.        Thought Content: Thought content normal.        Judgment: Judgment normal.      Lab Results:  CBC    Component Value Date/Time   WBC 6.3 08/30/2021 1529   RBC 4.17 08/30/2021 1529   HGB 12.8 08/30/2021 1529   HCT 38.3 08/30/2021 1529   PLT 324.0 08/30/2021 1529   MCV 91.7 08/30/2021 1529   MCH 30.6  05/30/2020 0000   MCHC 33.5 08/30/2021 1529   RDW 13.3 08/30/2021 1529   LYMPHSABS 2.1 08/30/2021 1529   MONOABS 0.6 08/30/2021 1529   EOSABS 0.3 08/30/2021 1529   BASOSABS 0.1 08/30/2021 1529    BMET    Component Value Date/Time   NA 140 08/30/2021 1529   K 4.5 08/30/2021 1529   CL 101 08/30/2021 1529   CO2 30 08/30/2021 1529   GLUCOSE 103 (H) 08/30/2021 1529   BUN 23 08/30/2021 1529   CREATININE 1.10 08/30/2021 1529   CREATININE 1.04 05/30/2020 0000   CALCIUM 9.8 08/30/2021 1529   CALCIUM 10.3 08/13/2011 0912   GFRNONAA 59 (L) 05/30/2020 0000   GFRAA 68 05/30/2020 0000    BNP No results found for: "BNP"  ProBNP No results found for: "PROBNP"  Imaging: No results found.   Assessment & Plan:   OSA (obstructive sleep apnea) - Sleep study on 12/05/21 showed severe sleep apnea, AHI 58.7/hour with spO2 low 72%. Due to severity of OSA recommend patient be started on CPAP. We will place a DME order for auto CPAP 5-20cm h20. Advised she aim to wear CPAP every night min 4-6 hours, work on weight loss efforts and continue to use wedge pillow.   Cough - Patient has had a persistent dry cough for several months with associated chest tightness. Start over the counter delsym cough syrup every 12 hours for cough suppression and prednisone taper. Continue Nexium twice daily before meals. Recommend patient avoid coughing as much as  possible, use sips of water's and sugar-free lozenge orders to prevent throat clearing. Advised patient if cough does not improve and we will get additional testing.    GERD (gastroesophageal reflux disease) - Continue Nexium twice daily - Following with GI   Chronic allergic rhinitis - No overt/acute PND symptoms. Continue singulair 10mg  at bedtime    , NP 02/10/2022

## 2022-02-04 ENCOUNTER — Encounter: Payer: Self-pay | Admitting: Family Medicine

## 2022-02-05 DIAGNOSIS — G4733 Obstructive sleep apnea (adult) (pediatric): Secondary | ICD-10-CM | POA: Diagnosis not present

## 2022-02-06 ENCOUNTER — Ambulatory Visit (INDEPENDENT_AMBULATORY_CARE_PROVIDER_SITE_OTHER): Payer: BC Managed Care – PPO | Admitting: Gastroenterology

## 2022-02-06 ENCOUNTER — Encounter: Payer: Self-pay | Admitting: Gastroenterology

## 2022-02-06 VITALS — BP 142/76 | HR 86 | Ht 62.0 in | Wt 254.2 lb

## 2022-02-06 DIAGNOSIS — R490 Dysphonia: Secondary | ICD-10-CM | POA: Diagnosis not present

## 2022-02-06 DIAGNOSIS — K219 Gastro-esophageal reflux disease without esophagitis: Secondary | ICD-10-CM

## 2022-02-06 MED ORDER — OMEPRAZOLE 40 MG PO CPDR
40.0000 mg | DELAYED_RELEASE_CAPSULE | Freq: Two times a day (BID) | ORAL | 3 refills | Status: DC
Start: 2022-02-06 — End: 2022-08-22

## 2022-02-06 MED ORDER — FAMOTIDINE 20 MG PO TABS: 20.0000 mg | ORAL_TABLET | Freq: Two times a day (BID) | ORAL | 3 refills | Status: DC

## 2022-02-06 NOTE — Progress Notes (Signed)
Referring Provider: Midge Minium, MD Primary Care Physician:  Midge Minium, MD   Reason for Consultation:  Hoarseness and reflux   IMPRESSION:  Dysphonia not explained by prior evaluation with ENT or EGD    - no throat complaints    - worsens the more she talks    - Records at [facility] and with Dr. Lucia Gaskins reviewed    - no prior laryngoscopy    - Esophagus normal endoscopically and histologically on EGD 12/17/2021    - ? 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  Given the lack of response to PPI therapy, must consider irritation of the larynx by other causes other than reflux.  If repeat evaluation by ENT including laryngoscopy does not provide a source we could consider prolonged pH monitoring.  Surgical fundoplication in this group has much lower efficacy than in those with typical reflux symptoms and is usually reserved for those who have improvement on acid suppression therapy.  PLAN: - Continue reflux lifestyle modifications including encouragement to work to a normal body weight  - Continue to avoid all NSAIDs - Switch esomeprazole to omeprazole 40 mg twice daily - Add famotidine 20 mg twice daily PRN if breakthrough  - Referral to [place] - Consider 24-hour pH monitoring if no other source for larynx irritation is identified by evaluation at the [facility]    HPI: Allison Reyes is a 61 y.o. female who returns in follow-up for further evaluation of chronic hoarseness and reflux.  She was seen  in consult 11/29/2021 and had an upper endoscopy 12/17/2021.  The interval history is obtained through the patient and review of her electronic health record.  At the time of her initial consult 11/29/2021 she reported 25-year history of acid reflux treated with Nexium 40 mg twice daily. Trial of OTC medications was unsuccessful in controlling her reflux. was reporting 3 years of dysphonia and a dry throat in the setting of both reflux and allergies, waking up with a dry throat, and severe brash if she missed a dose of PPI.  Symptoms occurred with a concurrent 60 pound weight gain.  She was also reporting bloating and a change in bowel habits. She is concerned because her brother had similar voice changes and died from a ruptured esophagus due to chronic reflux  Previously seen by ENT (Dr. Lucia Gaskins and Dr. Laurance Flatten at [facility]) chronic sinusitis and dysphonia and pulmonary, neither of whom were able to help with the dysphonia. She takes montelukast and cetirizine daily and will use saline nasal spray as needed. She is unable to use OTC nasal sprays such as Flonase or nasacort. She has been on oral prednisone in the past for sinusitis symptoms.  She will have a large, loose bowel movement 6 out of 7 days. Symptoms started 3 years ago when she started drinking coffee. She finds the volume a larger volume than expected and out of proportion to the amount of food that she eats.  Prior history of constipation.   EGD 12/17/2021 showed LA grade a  reflux esophagitis and multiple gastric erosions in the antrum. Mid, proximal, and distal esophageal biopsies were negative.  There was no reflux or eosinophilic esophagitis.  Gastric biopsies showed a mild nonspecific reactive gastropathy and a diminutive fundic gland polyp.  She returns today in follow-up after endoscopic evaluation.  Her symptoms are largely unchanged.  She continues to use Nexium 40 mg twice daily. She has not stopped using Aleve and other NSAIDs and coffee  without changing her symptoms. Rare breakthrough reflux. She started using a CPAP for reflux last night. She finds that her hoarseness worsens as she talks. She is able to use a shot of Schnapps prior to singing to minimize her symptoms.   Brother died from a ruptured esophagus after presenting with similar symptoms. So, she has been relieved by the results of the EGD.   Colon cancer screening history: Colonoscopy x 3 in [place] many years ago Cologuard negative 09/11/19  Past Medical History:  Diagnosis Date   Depression    GERD (gastroesophageal reflux disease)    Hyperlipidemia    Hyperparathyroidism    Osteopenia    Sleep apnea    Stress incontinence     Past Surgical History:  Procedure Laterality Date   ABDOMINAL HYSTERECTOMY     TAH - endometriosis   LAPAROSCOPIC ENDOMETRIOSIS FULGURATION     LAPAROTOMY     LUMBAR LAMINECTOMY/DECOMPRESSION MICRODISCECTOMY Left 03/07/2017   Procedure: L4-5 Discectomy;  Surgeon: Ashok Pall, MD;  Location: [place];  Service: Neurosurgery;  Laterality: Left;   NASAL SINUS SURGERY  01/03/11   OOPHORECTOMY     THYROIDECTOMY, PARTIAL       Current Outpatient Medications  Medication Sig Dispense Refill   atorvastatin (LIPITOR) 40 MG tablet Take 1 tablet (40 mg total) by mouth at bedtime. 90 tablet 1   buPROPion (WELLBUTRIN XL) 150 MG 24 hr tablet Take 1 tablet (150 mg total) by mouth daily. 90 tablet 1   buPROPion (WELLBUTRIN XL) 300 MG 24 hr tablet Take 1 tablet (300 mg total) by mouth daily. 90 tablet 1   calcitRIOL (ROCALTROL) 0.5 MCG capsule Take 3 capsules (1.5 mcg total) by mouth daily. 270 capsule 1   cetirizine (ZYRTEC) 10 MG tablet Take 1 tablet (10 mg total) by mouth daily. 90 tablet 3   citalopram (CELEXA) 40 MG tablet Take 1 tablet (40 mg total) by mouth daily. 90 tablet 1   esomeprazole (NEXIUM) 40 MG capsule Take 1 capsule (40 mg total) by mouth 2 (two) times daily before a meal. 180 capsule 3   levothyroxine (SYNTHROID) 150  MCG tablet TAKE 1 TABLET BY MOUTH DAILY BEFORE AND BREAKFAST 90 tablet 1   montelukast (SINGULAIR) 10 MG tablet TAKE 1 TABLET(10 MG) BY MOUTH AT BEDTIME 90 tablet 1   rOPINIRole (REQUIP) 1 MG tablet Take 1 tablet (1 mg total) by mouth at bedtime. 90 tablet 3   tiZANidine (ZANAFLEX) 4 MG tablet Take 1 tablet (4 mg total) by mouth every 6 (six) hours as needed for muscle spasms. 90 tablet 0   zolpidem (AMBIEN) 10 MG tablet TAKE 1 TABLET(10 MG) BY MOUTH AT BEDTIME AS NEEDED 90 tablet 0   predniSONE (DELTASONE) 10 MG tablet 4 tabs for 2 days, then 3 tabs for 2 days, 2 tabs for 2 days, then 1 tab for 2 days, then stop (Patient not taking: Reported on 02/06/2022) 20 tablet 0   No current facility-administered medications for this visit.    Allergies as of 02/06/2022 - Review Complete 02/06/2022  Allergen Reaction Noted   Flonase [fluticasone] Nausea And Vomiting 06/26/2020    Physical Exam: General: Awake, alert, and oriented, and well communicative. In no acute distress.  HEENT: EOMI, non-icteric sclera, NCAT, MMM  Neck: Normal movement of head and neck  Pulm: No labored breathing, speaking in full sentences without conversational dyspnea  Derm: No apparent lesions or bruising in visible field  MS: Moves all visible extremities without noticeable abnormality  Psych: Pleasant, cooperative, normal speech, normal affect and normal insight Neuro: Alert and appropriate   Allison Harbin L. Tarri Glenn, MD, MPH [facility] 02/06/2022, 9:08 AM         Mickel Crow. Tarri Glenn, MD, MPH 02/06/2022, 8:44 AM

## 2022-02-06 NOTE — Patient Instructions (Addendum)
It was a pleasure to see you today.   We will switch your Nexium to omeprazole 40 mg twice daily to see if this helps.  I also recommended using famotidine twice daily as needed for any breakthrough heartburn.  Some of your symptoms may be related to reflux, although, I think there might be something going on with your voice box (larynx).  I would like for you to see the [facility]. Please follow-up with me after your appointment there.   Avoid all non-steroidal anti-inflammatory medications including Aleve.   Modifying diet and lifestyle remains the foundation for treating the symptoms of reflux.   The following strategies help you prevent heartburn and other symptoms by avoiding foods that reduce the effectiveness of the bottom of the esophagus from protecting the esophagus from from acid injury and keeping stomach contents where they belong.  Eat smaller meals. A large meal remains in the stomach for several hours, increasing the chances for gastroesophageal reflux. Try distributing your daily food intake over three, four, or five smaller meals.  Relax when you eat. Stress increases the production of stomach acid, so make meals a pleasant, relaxing experience. Sit down. Eat slowly. Chew completely. Play soothing music.  Relax between meals. Relaxation therapies such as deep breathing, meditation, massage, tai chi, or yoga may help prevent and relieve heartburn.   Remain upright after eating. You should maintain postures that reduce the risk for reflux for at least three hours after eating. For example, don't bend over or strain to lift heavy objects.  Avoid eating within three hours of going to bed. Lying down after eating will increase chances of reflux.  Lose weight. Excess pounds increase pressure on the stomach and can push acid into the esophagus.  Loosen up. Avoid tight belts, waistbands, and other clothing that puts pressure on your stomach.  Avoid foods  that burn. Abstain from food or drink that increases gastric acid secretion, decreases the valve at the bottom of the esophagus, or slows the emptying of the stomach. Known offenders include high-fat foods, spicy dishes, tomatoes and tomato products, citrus fruits, garlic, onions, milk, carbonated drinks, coffee (including decaf), tea, chocolate, mints, and alcohol.  Avoid medications that can predispose you to reflux including aspirin and other NSAIDs, oral contraceptives, hormone therapy drugs, and certain antidepressants.  Sleep at an angle. If you're bothered by nighttime heartburn, place a wedge (available in medical supply stores or a wedge pillow through [employer]) under your upper body. But don't elevate your head with extra pillows. That makes reflux worse by bending you at the waist and compressing your stomach. You might also try sleeping on your left side, as studies have shown this can help--perhaps because the stomach is on the left side of the body, so lying on your left positions most of the stomach below the bottom of the esophagus.

## 2022-02-07 ENCOUNTER — Telehealth: Payer: Self-pay | Admitting: *Deleted

## 2022-02-07 NOTE — Telephone Encounter (Signed)
Spoke with [facility] scheduled patient to see Dr. Rowe Clack on 04/17/22 @ 11:40 am. Patient informed. MyChart message sent to patient with information as well.

## 2022-02-07 NOTE — Telephone Encounter (Signed)
error 

## 2022-02-07 NOTE — Addendum Note (Signed)
Addended by: Horris Latino on: 02/07/2022 04:05 PM   Modules accepted: Orders

## 2022-02-10 DIAGNOSIS — G4733 Obstructive sleep apnea (adult) (pediatric): Secondary | ICD-10-CM | POA: Insufficient documentation

## 2022-02-10 NOTE — Assessment & Plan Note (Signed)
-   No overt/acute PND symptoms. Continue singulair 10mg  at bedtime

## 2022-02-10 NOTE — Assessment & Plan Note (Signed)
-   Sleep study on 12/05/21 showed severe sleep apnea, AHI 58.7/hour with spO2 low 72%. Due to severity of OSA recommend patient be started on CPAP. We will place a DME order for auto CPAP 5-20cm h20. Advised she aim to wear CPAP every night min 4-6 hours, work on weight loss efforts and continue to use wedge pillow.

## 2022-02-10 NOTE — Assessment & Plan Note (Deleted)
-   No overt/acute PND symptoms. Chronic cough. Continue singulair 10mg  at bedtime

## 2022-02-10 NOTE — Assessment & Plan Note (Signed)
-   Patient has had a persistent dry cough for several months with associated chest tightness. Start over the counter delsym cough syrup every 12 hours for cough suppression and prednisone taper. Continue Nexium twice daily before meals. Recommend patient avoid coughing as much as possible, use sips of water's and sugar-free lozenge orders to prevent throat clearing. Advised patient if cough does not improve and we will get additional testing.

## 2022-02-10 NOTE — Assessment & Plan Note (Signed)
-   Continue Nexium twice daily - Following with GI

## 2022-02-12 ENCOUNTER — Telehealth: Payer: Self-pay

## 2022-02-12 NOTE — Telephone Encounter (Signed)
PA Submitted thru Cover My Meds for Rx [phone] . Waiting on response

## 2022-02-15 NOTE — Telephone Encounter (Signed)
Medication was approved pt aware

## 2022-02-27 ENCOUNTER — Other Ambulatory Visit: Payer: Self-pay | Admitting: Family Medicine

## 2022-03-08 DIAGNOSIS — G4733 Obstructive sleep apnea (adult) (pediatric): Secondary | ICD-10-CM | POA: Diagnosis not present

## 2022-03-21 ENCOUNTER — Other Ambulatory Visit: Payer: Self-pay | Admitting: Family Medicine

## 2022-03-25 ENCOUNTER — Encounter: Payer: Self-pay | Admitting: Adult Health

## 2022-03-25 ENCOUNTER — Telehealth (INDEPENDENT_AMBULATORY_CARE_PROVIDER_SITE_OTHER): Payer: BC Managed Care – PPO | Admitting: Adult Health

## 2022-03-25 DIAGNOSIS — J069 Acute upper respiratory infection, unspecified: Secondary | ICD-10-CM | POA: Diagnosis not present

## 2022-03-25 DIAGNOSIS — G4733 Obstructive sleep apnea (adult) (pediatric): Secondary | ICD-10-CM | POA: Diagnosis not present

## 2022-03-25 MED ORDER — AZITHROMYCIN 250 MG PO TABS: ORAL_TABLET | ORAL | 0 refills | Status: AC

## 2022-03-25 MED ORDER — BENZONATATE 200 MG PO CAPS: 200.0000 mg | ORAL_CAPSULE | Freq: Three times a day (TID) | ORAL | 1 refills | Status: DC | PRN

## 2022-03-25 NOTE — Patient Instructions (Addendum)
Continue on CPAP at bedtime-but the good work Work on [employer] Do not drive if sleepy  Mucinex DM twice daily as needed for cough congestion Tessalon Perles 3 times daily as needed for cough Fluids and rest Continue on Singulair and Zyrtec at bedtime Continue on GERD diet and medications Z-Pak to have on hold if symptoms worsen with discolored mucus  Follow up with Clent Ridges NP or Dr. Wynona Neat in 4-6 months and As needed   Please contact office for sooner follow up if symptoms do not improve or worsen or seek emergency care

## 2022-03-25 NOTE — Progress Notes (Signed)
Virtual Visit via Video Note  I connected with Allison Reyes on 03/25/22 at  2:30 PM EST by a video enabled telemedicine application and verified that I am speaking with the correct person using two identifiers.  Location: Patient: Home  Provider: Office    I discussed the limitations of evaluation and management by telemedicine and the availability of in person appointments. The patient expressed understanding and agreed to proceed.  History of Present Illness: 61 year old female followed for obstructive sleep apnea and chronic hoarseness.  Today's video visit is a 6-week follow-up.  Patient was diagnosed with severe sleep apnea in August 2023.  AHI 58.7/hour and SPO2 low of 72%.  Patient was recommended begin on CPAP therapy.  Since last visit patient says she is started on CPAP.  She is wearing her CPAP 6-7 hr . Feels it is helping slightly , snoring has resolved and slight improvement in sleep .   CPAP download shows excellent compliance with 100% usage.  Daily average usage at 6.5 hours.  Patient is on auto CPAP 5 to 20 cm H2O.  AHI 5.4.  Daily average pressure is 16.6 cm H2O.  Complains of 4 days of cough, congestion, hoarseness. , thick mucus and wheezing . Cough is severe at times. Harsh in nature. Taking otc cold meds without significant help.  Has not had covid booster or flu shot. Took home covid test and was negative.  No fever. Feels feverish. No aches.  She denies any chest pain, orthopnea, edema, nausea vomiting or diarrhea.  Appetite is okay.  Patient complained of ongoing chronic hoarseness and reflux.  She was referred to GI.  Office notes reviewed patient was changed from Nexium to omeprazole 40 mg twice daily.  Recommend to avoid nonsteroidals. Has not noticed any change , also does not believe she changed to Prilosec.  Has upcoming office visit with voice and swallowing center at [facility] next month.   Past Medical History:  Diagnosis Date   Depression    GERD  (gastroesophageal reflux disease)    Hyperlipidemia    Hyperparathyroidism    Osteopenia    Sleep apnea    Stress incontinence    Current Outpatient Medications on File Prior to Visit  Medication Sig Dispense Refill   atorvastatin (LIPITOR) 40 MG tablet Take 1 tablet by mouth at bedtime. 90 tablet 0   buPROPion (WELLBUTRIN XL) 150 MG 24 hr tablet Take 1 tablet by mouth daily. 90 tablet 0   buPROPion (WELLBUTRIN XL) 300 MG 24 hr tablet Take 1 tablet (300 mg total) by mouth daily. 90 tablet 1   calcitRIOL (ROCALTROL) 0.5 MCG capsule Take 3 capsules (1.5 mcg total) by mouth daily. 270 capsule 1   cetirizine (ZYRTEC) 10 MG tablet Take 1 tablet (10 mg total) by mouth daily. 90 tablet 3   citalopram (CELEXA) 40 MG tablet Take 1 tablet by mouth daily. 90 tablet 0   famotidine (PEPCID) 20 MG tablet Take 1 tablet (20 mg total) by mouth 2 (two) times daily. 180 tablet 3   levothyroxine (SYNTHROID) 150 MCG tablet Take 1 tablet by mouth daily before breakfast. 90 tablet 0   montelukast (SINGULAIR) 10 MG tablet TAKE 1 TABLET(10 MG) BY MOUTH AT BEDTIME 90 tablet 1   omeprazole (PRILOSEC) 40 MG capsule Take 1 capsule (40 mg total) by mouth in the morning and at bedtime. 180 capsule 3   predniSONE (DELTASONE) 10 MG tablet 4 tabs for 2 days, then 3 tabs for 2 days, 2 tabs  for 2 days, then 1 tab for 2 days, then stop 20 tablet 0   rOPINIRole (REQUIP) 1 MG tablet Take 1 tablet (1 mg total) by mouth at bedtime. 90 tablet 3   tiZANidine (ZANAFLEX) 4 MG tablet Take 1 tablet (4 mg total) by mouth every 6 (six) hours as needed for muscle spasms. 90 tablet 0   zolpidem (AMBIEN) 10 MG tablet TAKE 1 TABLET(10 MG) BY MOUTH AT BEDTIME AS NEEDED 90 tablet 0   No current facility-administered medications on file prior to visit.      Observations/Objective: Sleep study on 12/05/21 showed severe sleep apnea, AHI 58.7/hour with spO2 low 72%   Assessment and Plan: Obstructive sleep apnea-excellent control and compliance  on CPAP.  Continue current settings  Acute URI-patient is continue on supportive care.  We will send in Z-Pak to have on hold if symptoms worsen with discolored mucus.  Informed patient most likely this is viral in nature.    Chronic hoarseness/GERD-follow-up with GI and speech Center  Plan  Patient Instructions  Continue on CPAP at bedtime-but the good work Work on healthy weight Do not drive if sleepy  Mucinex DM twice daily as needed for cough congestion Tessalon Perles 3 times daily as needed for cough Fluids and rest Continue on Singulair and Zyrtec at bedtime Continue on GERD diet and medications Z-Pak to have on hold if symptoms worsen with discolored mucus  Follow up with Clent Ridges NP or Dr. Wynona Neat in 4-6 months and As needed   Please contact office for sooner follow up if symptoms do not improve or worsen or seek emergency care   '   Follow Up Instructions:    I discussed the assessment and treatment plan with the patient. The patient was provided an opportunity to ask questions and all were answered. The patient agreed with the plan and demonstrated an understanding of the instructions.   The patient was advised to call back or seek an in-person evaluation if the symptoms worsen or if the condition fails to improve as anticipated.  I provided 30  minutes of non-face-to-face time during this encounter.   Rubye Oaks, NP

## 2022-03-26 ENCOUNTER — Telehealth: Payer: BC Managed Care – PPO | Admitting: Family Medicine

## 2022-03-28 ENCOUNTER — Telehealth: Payer: Self-pay | Admitting: Primary Care

## 2022-03-28 NOTE — Telephone Encounter (Signed)
No history of asthma, but chronic cough.  We see her for OSA.  We did a video visit and she was sick.  She has an appointment with her pcp scheduled for the following day, however, after she had the visit with Tammy, she cancelled that appointment.  Her voice is still very raspy and at times has no voice.  She was offered an appointment to see a provider in our office, however, she declined.  I will provide your recommendations.  Thank you.

## 2022-03-28 NOTE — Telephone Encounter (Signed)
If she wants to do the steroid she can do the steroids.  Otherwise just watch which would be my preferred recommendation.

## 2022-03-28 NOTE — Telephone Encounter (Signed)
Primary Pulmonologist: Rubye Oaks NP Last office visit and with whom: 03/25/22 Tammy Parrett NP What do we see them for (pulmonary problems): OSA, acute URI Last OV assessment/plan:  Assessment and Plan: Obstructive sleep apnea-excellent control and compliance on CPAP.  Continue current settings   Acute URI-patient is continue on supportive care.  We will send in Z-Pak to have on hold if symptoms worsen with discolored mucus.  Informed patient most likely this is viral in nature.       Chronic hoarseness/GERD-follow-up with GI and speech Center   Plan  Patient Instructions  Continue on CPAP at bedtime-but the good work Work on healthy weight Do not drive if sleepy   Mucinex DM twice daily as needed for cough congestion Tessalon Perles 3 times daily as needed for cough Fluids and rest Continue on Singulair and Zyrtec at bedtime Continue on GERD diet and medications Z-Pak to have on hold if symptoms worsen with discolored mucus   Follow up with Clent Ridges NP or Dr. Wynona Neat in 4-6 months and As needed   Please contact office for sooner follow up if symptoms do not improve or worsen or seek emergency care   '     Follow Up Instructions:     I discussed the assessment and treatment plan with the patient. The patient was provided an opportunity to ask questions and all were answered. The patient agreed with the plan and demonstrated an understanding of the instructions.   The patient was advised to call back or seek an in-person evaluation if the symptoms worsen or if the condition fails to improve as anticipated.   I provided 30  minutes of non-face-to-face time during this encounter.     Rubye Oaks, NP        Patient Instructions by Julio Sicks, NP at 03/25/2022 2:30 PM  Author: Julio Sicks, NP Author Type: Nurse Practitioner Filed: 03/25/2022  3:30 PM  Note Status: Addendum Cosign: Cosign Not Required Encounter Date: 03/25/2022  Editor: Julio Sicks, NP  (Nurse Practitioner)      Prior Versions: 1. Parrett, Virgel Bouquet, NP (Nurse Practitioner) at 03/25/2022  3:29 PM - Addendum   2. Julio Sicks, NP (Nurse Practitioner) at 03/25/2022  3:28 PM - Signed  Continue on CPAP at bedtime-but the good work Work on [employer] Do not drive if sleepy   Mucinex DM twice daily as needed for cough congestion Tessalon Perles 3 times daily as needed for cough Fluids and rest Continue on Singulair and Zyrtec at bedtime Continue on GERD diet and medications Z-Pak to have on hold if symptoms worsen with discolored mucus   Follow up with Clent Ridges NP or Dr. Wynona Neat in 4-6 months and As needed   Please contact office for sooner follow up if symptoms do not improve or worsen or seek emergency care          Instructions  Continue on CPAP at bedtime-but the good work Work on healthy weight Do not drive if sleepy   Mucinex DM twice daily as needed for cough congestion Tessalon Perles 3 times daily as needed for cough Fluids and rest Continue on Singulair and Zyrtec at bedtime Continue on GERD diet and medications Z-Pak to have on hold if symptoms worsen with discolored mucus   Follow up with Clent Ridges NP or Dr. Wynona Neat in 4-6 months and As needed   Please contact office for sooner follow up if symptoms do not improve or worsen or seek emergency care  Was appointment offered to patient (explain)?  Yes, patient declined   Reason for call:  PT saw TP on 11/27 via MyChart. She started the ZPack yesterday morning (11/29) and is taking the cough medcine prescribed. States she still does not feel well and wonders if she can get a steroid. Pharmacy is [facility] on [address] in [place]. Offered appt but pt denied.  Says she feels a little better.  She is coughing up a cloudy yellow/green mucous.  She gets chills, but no fever or body aches.  Took covid test on Friday (11/24) negative.  Says she still has no voice.  No congestion in head.  No loss of  taste or smell.  Has some HA, taking tylenol for HA (zana flu, mucinex DM liquid)  and benzonatate tabs.  She states she is still having the coughing fits and wonders if she needs a steroid.  She is going to take another covid test.  Dr. Marchelle Gearing, please advise on recommendations.    (examples of things to ask: : When did symptoms start? Fever? Cough? Productive? Color to sputum? More sputum than usual? Wheezing? Have you needed increased oxygen? Are you taking your respiratory medications? What over the counter measures have you tried?)  Allergies  Allergen Reactions   Flonase [Fluticasone] Nausea And Vomiting    Immunization History  Administered Date(s) Administered   Influenza Inj Mdck Quad Pf 01/16/2018   Influenza,inj,Quad PF,6+ Mos 01/28/2019, 03/13/2021   Influenza-Unspecified 03/08/2020   PFIZER Comirnaty(Gray Top)Covid-19 Tri-Sucrose Vaccine 06/23/2020   PFIZER(Purple Top)SARS-COV-2 Vaccination 07/15/2019, 08/09/2019   Tdap 08/13/2011

## 2022-03-28 NOTE — Telephone Encounter (Signed)
Called and spoke with patient, scheduled appointment for her to see Rubye Oaks NP on 04/02/2022 at 3 pm, advised to arrive by 2:45 pm for check in.  Advised to see medical care at PCP office or [facility] if she gets worse before Tuesday.  She verbalized understanding.  Nothing further needed.

## 2022-03-28 NOTE — Telephone Encounter (Signed)
Not sure what the indication for steroids is.  She wheezing?  Does she have a history of asthma?  Has she had steroids for similar conditions in the past?.  I am not seeing based on her medical history any indication for steroid?  It appears that she still is infected and it seems azithromycin is helping.  She ideally needs to stay the course and if she is getting worse to be checked out in an urgent care or emergency room or in our office.  However if she insist that she wants short course prednisone you can send  Please take prednisone 40 mg x1 day, then 30 mg x1 day, then 20 mg x1 day, then 10 mg x1 day, and then 5 mg x1 day and stop       has a past medical history of Depression, GERD (gastroesophageal reflux disease), Hyperlipidemia, Hyperparathyroidism, Osteopenia, Sleep apnea, and Stress incontinence.   reports that she quit smoking about 3 years ago. Her smoking use included cigarettes. She has a 1.00 pack-year smoking history. She has never used smokeless tobacco.  Past Surgical History:  Procedure Laterality Date   ABDOMINAL HYSTERECTOMY     TAH - endometriosis   LAPAROSCOPIC ENDOMETRIOSIS FULGURATION     LAPAROTOMY     LUMBAR LAMINECTOMY/DECOMPRESSION MICRODISCECTOMY Left 03/07/2017   Procedure: L4-5 Discectomy;  Surgeon: Coletta Memos, MD;  Location: [facility] OR;  Service: Neurosurgery;  Laterality: Left;   NASAL SINUS SURGERY  01/03/11   OOPHORECTOMY     THYROIDECTOMY, PARTIAL      Allergies  Allergen Reactions   Flonase [Fluticasone] Nausea And Vomiting    Immunization History  Administered Date(s) Administered   Influenza Inj Mdck Quad Pf 01/16/2018   Influenza,inj,Quad PF,6+ Mos 01/28/2019, 03/13/2021   Influenza-Unspecified 03/08/2020   PFIZER Comirnaty(Gray Top)Covid-19 Tri-Sucrose Vaccine 06/23/2020   PFIZER(Purple Top)SARS-COV-2 Vaccination 07/15/2019, 08/09/2019   Tdap 08/13/2011    Family History  Problem Relation Age of Onset   Coronary artery disease  Mother    Hypertension Mother    Alcohol abuse Mother    Depression Mother    Hypertension Father    Lung cancer Father    Stroke Paternal Grandfather    Diabetes Neg Hx    Colon cancer Neg Hx    Breast cancer Neg Hx      Current Outpatient Medications:    atorvastatin (LIPITOR) 40 MG tablet, Take 1 tablet by mouth at bedtime., Disp: 90 tablet, Rfl: 0   azithromycin (ZITHROMAX Z-PAK) 250 MG tablet, Take 2 tablets (500 mg) on  Day 1,  followed by 1 tablet (250 mg) once daily on Days 2 through 5., Disp: 6 each, Rfl: 0   benzonatate (TESSALON) 200 MG capsule, Take 1 capsule (200 mg total) by mouth 3 (three) times daily as needed., Disp: 45 capsule, Rfl: 1   buPROPion (WELLBUTRIN XL) 150 MG 24 hr tablet, Take 1 tablet by mouth daily., Disp: 90 tablet, Rfl: 0   buPROPion (WELLBUTRIN XL) 300 MG 24 hr tablet, Take 1 tablet (300 mg total) by mouth daily., Disp: 90 tablet, Rfl: 1   calcitRIOL (ROCALTROL) 0.5 MCG capsule, Take 3 capsules (1.5 mcg total) by mouth daily., Disp: 270 capsule, Rfl: 1   cetirizine (ZYRTEC) 10 MG tablet, Take 1 tablet (10 mg total) by mouth daily., Disp: 90 tablet, Rfl: 3   citalopram (CELEXA) 40 MG tablet, Take 1 tablet by mouth daily., Disp: 90 tablet, Rfl: 0   famotidine (PEPCID) 20 MG tablet, Take 1 tablet (20  mg total) by mouth 2 (two) times daily., Disp: 180 tablet, Rfl: 3   levothyroxine (SYNTHROID) 150 MCG tablet, Take 1 tablet by mouth daily before breakfast., Disp: 90 tablet, Rfl: 0   montelukast (SINGULAIR) 10 MG tablet, TAKE 1 TABLET(10 MG) BY MOUTH AT BEDTIME, Disp: 90 tablet, Rfl: 1   omeprazole (PRILOSEC) 40 MG capsule, Take 1 capsule (40 mg total) by mouth in the morning and at bedtime., Disp: 180 capsule, Rfl: 3   predniSONE (DELTASONE) 10 MG tablet, 4 tabs for 2 days, then 3 tabs for 2 days, 2 tabs for 2 days, then 1 tab for 2 days, then stop, Disp: 20 tablet, Rfl: 0   rOPINIRole (REQUIP) 1 MG tablet, Take 1 tablet (1 mg total) by mouth at bedtime., Disp:  90 tablet, Rfl: 3   tiZANidine (ZANAFLEX) 4 MG tablet, Take 1 tablet (4 mg total) by mouth every 6 (six) hours as needed for muscle spasms., Disp: 90 tablet, Rfl: 0   zolpidem (AMBIEN) 10 MG tablet, TAKE 1 TABLET(10 MG) BY MOUTH AT BEDTIME AS NEEDED, Disp: 90 tablet, Rfl: 0

## 2022-03-28 NOTE — Telephone Encounter (Signed)
PT saw TP on 11/27 via MyChart. She started the ZPack yesterday morning (11/29) and is taking the cough medcine prescribed. States she still does not feel well and wonders if she can get a steroid. Pharmacy is [facility] on [address] in [place]. Offered appt but pt denied. Please advise.

## 2022-04-02 ENCOUNTER — Encounter: Payer: Self-pay | Admitting: Pulmonary Disease

## 2022-04-02 ENCOUNTER — Ambulatory Visit: Payer: BC Managed Care – PPO | Admitting: Adult Health

## 2022-04-02 ENCOUNTER — Ambulatory Visit (INDEPENDENT_AMBULATORY_CARE_PROVIDER_SITE_OTHER): Payer: BC Managed Care – PPO

## 2022-04-02 ENCOUNTER — Ambulatory Visit (INDEPENDENT_AMBULATORY_CARE_PROVIDER_SITE_OTHER): Payer: BC Managed Care – PPO | Admitting: Pulmonary Disease

## 2022-04-02 VITALS — BP 122/74 | HR 79 | Temp 98.1°F | Ht 62.0 in | Wt 255.6 lb

## 2022-04-02 DIAGNOSIS — R052 Subacute cough: Secondary | ICD-10-CM

## 2022-04-02 DIAGNOSIS — R059 Cough, unspecified: Secondary | ICD-10-CM | POA: Diagnosis not present

## 2022-04-02 MED ORDER — PREDNISONE 20 MG PO TABS: ORAL_TABLET | ORAL | 0 refills | Status: AC

## 2022-04-02 NOTE — Patient Instructions (Addendum)
Nice to meet you  I sent a prednisone taper to your local pharmacy  We will get a chest x-ray today  I will take a look at it later and if we need to call in a different antibiotic we will contact you and let you know  Return to clinic in 6 weeks or sooner as needed with Dr. Judeth Horn

## 2022-04-03 ENCOUNTER — Encounter: Payer: Self-pay | Admitting: Pulmonary Disease

## 2022-04-04 NOTE — Progress Notes (Signed)
Chest xray clear

## 2022-04-07 DIAGNOSIS — G4733 Obstructive sleep apnea (adult) (pediatric): Secondary | ICD-10-CM | POA: Diagnosis not present

## 2022-04-17 DIAGNOSIS — Z9889 Other specified postprocedural states: Secondary | ICD-10-CM | POA: Diagnosis not present

## 2022-04-17 DIAGNOSIS — J383 Other diseases of vocal cords: Secondary | ICD-10-CM | POA: Diagnosis not present

## 2022-04-17 DIAGNOSIS — R490 Dysphonia: Secondary | ICD-10-CM | POA: Diagnosis not present

## 2022-04-17 DIAGNOSIS — J385 Laryngeal spasm: Secondary | ICD-10-CM | POA: Diagnosis not present

## 2022-04-17 DIAGNOSIS — F17210 Nicotine dependence, cigarettes, uncomplicated: Secondary | ICD-10-CM | POA: Diagnosis not present

## 2022-04-18 ENCOUNTER — Other Ambulatory Visit (HOSPITAL_COMMUNITY): Payer: Self-pay

## 2022-04-18 ENCOUNTER — Telehealth: Payer: Self-pay

## 2022-04-18 MED ORDER — BUDESONIDE-FORMOTEROL FUMARATE 160-4.5 MCG/ACT IN AERO: 2.0000 | INHALATION_SPRAY | Freq: Two times a day (BID) | RESPIRATORY_TRACT | 12 refills | Status: DC

## 2022-04-18 NOTE — Telephone Encounter (Signed)
Hey ladies,  Dr Judeth Horn is asking if we can run a ticket on this patient to see what is covered as far as ICS/LABA inhalers.  Thank you

## 2022-04-18 NOTE — Telephone Encounter (Signed)
Called and spoke to patient about the inhaler from Dr Judeth Horn given in relation to her chest xray and her asking for a possible inhaler. She states that she is wanting to try the Symbicort. Verified pharmacy with her. Medication sent in. Nothing further needed

## 2022-04-18 NOTE — Telephone Encounter (Signed)
Dr Judeth Horn,  Per our pharmacy team the following is covered:  Covered ICS/LABA are as follows per benefits investigation:   Brand Advair Diskus Brand Advair HFA Brand Symbicort Lavada Mesi

## 2022-04-18 NOTE — Telephone Encounter (Signed)
Symbicort 160 dose 2 puff BID - thanks!

## 2022-04-18 NOTE — Telephone Encounter (Signed)
Covered ICS/LABA are as follows per benefits investigation:  Brand Advair Diskus Brand Advair HFA Brand Symbicort Lavada Mesi

## 2022-05-01 ENCOUNTER — Encounter: Payer: Self-pay | Admitting: Family Medicine

## 2022-05-01 ENCOUNTER — Other Ambulatory Visit: Payer: Self-pay | Admitting: Family Medicine

## 2022-05-01 ENCOUNTER — Other Ambulatory Visit: Payer: Self-pay

## 2022-05-01 DIAGNOSIS — F332 Major depressive disorder, recurrent severe without psychotic features: Secondary | ICD-10-CM

## 2022-05-01 MED ORDER — BUPROPION HCL ER (XL) 300 MG PO TB24: 300.0000 mg | ORAL_TABLET | Freq: Every day | ORAL | 1 refills | Status: DC

## 2022-05-01 MED ORDER — BUPROPION HCL ER (XL) 150 MG PO TB24: 150.0000 mg | ORAL_TABLET | Freq: Every day | ORAL | 0 refills | Status: DC

## 2022-05-01 MED ORDER — WEGOVY 0.25 MG/0.5ML ~~LOC~~ SOAJ: 0.2500 mg | SUBCUTANEOUS | 1 refills | Status: DC

## 2022-05-01 NOTE — Telephone Encounter (Signed)
Ambien 10 mg LOV: 08/30/21 Last Refill:10/11/21 Upcoming appt: none

## 2022-05-08 DIAGNOSIS — G4733 Obstructive sleep apnea (adult) (pediatric): Secondary | ICD-10-CM | POA: Diagnosis not present

## 2022-05-15 ENCOUNTER — Encounter: Payer: Self-pay | Admitting: Pulmonary Disease

## 2022-05-15 ENCOUNTER — Ambulatory Visit (INDEPENDENT_AMBULATORY_CARE_PROVIDER_SITE_OTHER): Payer: BC Managed Care – PPO | Admitting: Pulmonary Disease

## 2022-05-15 VITALS — BP 128/70 | HR 77 | Ht 62.0 in | Wt 264.2 lb

## 2022-05-15 DIAGNOSIS — G4733 Obstructive sleep apnea (adult) (pediatric): Secondary | ICD-10-CM | POA: Diagnosis not present

## 2022-05-15 DIAGNOSIS — R0609 Other forms of dyspnea: Secondary | ICD-10-CM | POA: Diagnosis not present

## 2022-05-15 NOTE — Patient Instructions (Addendum)
Nice to see you again  I am glad the cough is improved  Resume the Symbicort, 2 puffs twice a day.  Rinse your mouth out with water after every use.  See if this helps some with shortness of breath particular with exertion.  I suspect that the combination of may be a little bit of ongoing recovery from your recent illness as well as may be a lack of stamina as you were ill and less active.  I encourage you to try to slowly increase your activity as tolerated.  Send me a message in about 4 weeks and let me know how the breathing is doing with the inhaler.  It appears that the company that supplies CPAP is called "[employer]."  I recommend calling them [phone]) and report the mask issue and see if they can send you a new mask.  I will also order a mask fitting in conjunction to see if this helps things.  Return to clinic in 3 months or sooner as needed with Dr. Silas Flood

## 2022-05-15 NOTE — Progress Notes (Signed)
@Patient  ID: Allison Reyes, female    DOB: 04/27/1961, 62 y.o.   MRN: [id number]  Chief Complaint  Patient presents with   Acute Visit    Pt is here for acute visit. Pt states that she has been coughing, wheezing and getting winded a lot since last Friday. She states that her grandchildren have RSV. Pt has tried tea, theraflu and mucinex dm. It helps for a littl ebit. Finished zpak and tessalon pearls but did not seem to help at all. When she coughs she states she feels dizzy.     Referring provider: Midge Minium, MD  HPI:   62 y.o. woman whom we are seeing for acute visit of ongoing cough and congestion, wheeze.  Most recent pulmonary notes x 2 via Rexene Edison, NP and Geraldo Pitter, NP reviewed.  Multiple recent telephone encounters for similar illness reviewed.  Patient reports several day history of cough and wheeze.  62 weeks.  Maybe longer.  Received antibiotic.  No improvement.  Notes grandchildren had RSV and with exposure to them recently.  She has not tested for viruses other than COVID, COVID-negative.  Wheeze and cough.  Often worse in the evenings.  Very disruptive.  Hard to sleep.  Got antibiotics.  Not much help.  Reviewed most recent chest x-ray 2020, on my review and interpretation reveals clear lungs bilaterally.    Questionaires / Pulmonary Flowsheets:   ACT:      No data to display          MMRC:     No data to display          Epworth:      No data to display          Tests:   FENO:  No results found for: "NITRICOXIDE"  PFT:     No data to display          WALK:      No data to display          Imaging: Personally reviewed and as per EMR discussion this note No results found.  Lab Results: Personally reviewed, notably eosinophils 300 earlier in 2023 CBC    Component Value Date/Time   WBC 6.3 08/30/2021 1529   RBC 4.17 08/30/2021 1529   HGB 12.8 08/30/2021 1529   HCT 38.3 08/30/2021 1529   PLT 324.0  08/30/2021 1529   MCV 91.7 08/30/2021 1529   MCH 30.6 05/30/2020 0000   MCHC 33.5 08/30/2021 1529   RDW 13.3 08/30/2021 1529   LYMPHSABS 2.1 08/30/2021 1529   MONOABS 0.6 08/30/2021 1529   EOSABS 0.3 08/30/2021 1529   BASOSABS 0.1 08/30/2021 1529    BMET    Component Value Date/Time   NA 140 08/30/2021 1529   K 4.5 08/30/2021 1529   CL 101 08/30/2021 1529   CO2 30 08/30/2021 1529   GLUCOSE 103 (H) 08/30/2021 1529   BUN 23 08/30/2021 1529   CREATININE 1.10 08/30/2021 1529   CREATININE 1.04 05/30/2020 0000   CALCIUM 9.8 08/30/2021 1529   CALCIUM 10.3 08/13/2011 0912   GFRNONAA 59 (L) 05/30/2020 0000   GFRAA 68 05/30/2020 0000    BNP No results found for: "BNP"  ProBNP No results found for: "PROBNP"  Specialty Problems       Pulmonary Problems   Cough   Chronic allergic rhinitis   Loud snoring   Chronic sinusitis   OSA (obstructive sleep apnea)    Allergies  Allergen Reactions  Flonase [Fluticasone] Nausea And Vomiting    Immunization History  Administered Date(s) Administered   Influenza Inj Mdck Quad Pf 01/16/2018   Influenza,inj,Quad PF,6+ Mos 01/28/2019, 03/13/2021   Influenza-Unspecified 03/08/2020   PFIZER Comirnaty(Gray Top)Covid-19 Tri-Sucrose Vaccine 06/23/2020   PFIZER(Purple Top)SARS-COV-2 Vaccination 07/15/2019, 08/09/2019   Tdap 08/13/2011    Past Medical History:  Diagnosis Date   Depression    GERD (gastroesophageal reflux disease)    Hyperlipidemia    Hyperparathyroidism    Osteopenia    Sleep apnea    Stress incontinence     Tobacco History: Social History   Tobacco Use  Smoking Status Former   Packs/day: 0.10   Years: 10.00   Total pack years: 1.00   Types: Cigarettes   Quit date: 07/27/2018   Years since quitting: 3.8  Smokeless Tobacco Never   Counseling given: Not Answered   Continue to not smoke  Outpatient Encounter Medications as of 04/02/2022  Medication Sig   benzonatate (TESSALON) 200 MG capsule Take 1  capsule (200 mg total) by mouth 3 (three) times daily as needed.   calcitRIOL (ROCALTROL) 0.5 MCG capsule Take 3 capsules (1.5 mcg total) by mouth daily.   cetirizine (ZYRTEC) 10 MG tablet Take 1 tablet (10 mg total) by mouth daily.   famotidine (PEPCID) 20 MG tablet Take 1 tablet (20 mg total) by mouth 2 (two) times daily.   montelukast (SINGULAIR) 10 MG tablet TAKE 1 TABLET(10 MG) BY MOUTH AT BEDTIME   omeprazole (PRILOSEC) 40 MG capsule Take 1 capsule (40 mg total) by mouth in the morning and at bedtime.   [EXPIRED] predniSONE (DELTASONE) 20 MG tablet Take 2 tablets (40 mg total) by mouth daily with breakfast for 5 days, THEN 1 tablet (20 mg total) daily with breakfast for 5 days, THEN 0.5 tablets (10 mg total) daily with breakfast for 5 days.   rOPINIRole (REQUIP) 1 MG tablet Take 1 tablet (1 mg total) by mouth at bedtime.   [DISCONTINUED] atorvastatin (LIPITOR) 40 MG tablet Take 1 tablet by mouth at bedtime.   [DISCONTINUED] buPROPion (WELLBUTRIN XL) 150 MG 24 hr tablet Take 1 tablet by mouth daily.   [DISCONTINUED] buPROPion (WELLBUTRIN XL) 300 MG 24 hr tablet Take 1 tablet (300 mg total) by mouth daily.   [DISCONTINUED] citalopram (CELEXA) 40 MG tablet Take 1 tablet by mouth daily.   [DISCONTINUED] levothyroxine (SYNTHROID) 150 MCG tablet Take 1 tablet by mouth daily before breakfast.   [DISCONTINUED] tiZANidine (ZANAFLEX) 4 MG tablet Take 1 tablet (4 mg total) by mouth every 6 (six) hours as needed for muscle spasms.   [DISCONTINUED] zolpidem (AMBIEN) 10 MG tablet TAKE 1 TABLET(10 MG) BY MOUTH AT BEDTIME AS NEEDED   [DISCONTINUED] predniSONE (DELTASONE) 10 MG tablet 4 tabs for 2 days, then 3 tabs for 2 days, 2 tabs for 2 days, then 1 tab for 2 days, then stop   No facility-administered encounter medications on file as of 04/02/2022.     Review of Systems  Review of Systems  No chest pain exertion.  No orthopnea or PND.  Comprehensive review of systems otherwise negative. Physical  Exam  BP 122/74 (BP Location: Left Arm, Patient Position: Sitting, Cuff Size: Normal)   Pulse 79   Temp 98.1 F (36.7 C) (Oral)   Ht 5\' 2"  (1.575 m)   Wt 255 lb 9.6 oz (115.9 kg)   SpO2 97%   BMI 46.75 kg/m   Wt Readings from Last 5 Encounters:  04/02/22 255 lb 9.6 oz (115.9  kg)  02/06/22 254 lb 4 oz (115.3 kg)  01/16/22 254 lb (115.2 kg)  12/17/21 251 lb (113.9 kg)  11/29/21 251 lb (113.9 kg)    BMI Readings from Last 5 Encounters:  04/02/22 46.75 kg/m  02/06/22 46.50 kg/m  01/16/22 46.46 kg/m  12/17/21 45.91 kg/m  11/29/21 45.91 kg/m     Physical Exam General: Sitting in chair, no acute distress Eyes: EOMI, no icterus Neck: Supple no JVP Pulmonary: Clear, normal work of breathing Cardiovascular warm no edema Abdomen: Significant bowel sounds present MSK: No synovitis, no joint effusion Neuro: Normal gait, no weakness Psych: Normal mood, full affect   Assessment & Plan:   Wheeze, cough: Following likely RSV infection (grandchildren tested positive). S/p antibiotics. In setting of chronic cough. Suspect underlying asthma vs just post viral cough syndrome. Prednisone taper. CXR today.    Return in about 6 weeks (around 05/14/2022).   Karren Burly, MD 05/15/2022   This appointment required 45 minutes of patient care (this includes precharting, chart review, review of results, face-to-face care, etc.).

## 2022-05-15 NOTE — Progress Notes (Signed)
@Patient  ID: Allison Reyes, female    DOB: Sep 10, 1960, 62 y.o.   MRN: [id number]  Chief Complaint  Patient presents with   Follow-up    Pt is here for follow up for cough. Pt states that her cough is doing better. She states sometimes she still feels like something is in her chest. She has not used the tessalon pearls anymore. And she forgets about the inhaler. Finished prednisone.     Referring provider: Midge Minium, MD  HPI:   62 y.o. woman whom we are seeing in follow-up after acute visit of ongoing cough and congestion, wheeze.    Overall, cough is improved.  At last visit was given prednisone extended taper.  This helped knock out most of the cough.  She also given Symbicort to help with postviral cough syndrome, likely underlying asthma in the setting of her chronic cough.  She is overwhelmed and feeling down to recover.  She has not used it in a few weeks as she is felt much better.  She does endorse a dyspnea on exertion.  Difficulty with stairs etc.  We discussed possible etiologies of this and strategies to improve this.  Chest x-ray obtained last visit personally reviewed, interpreted as clear lungs.   Questionaires / Pulmonary Flowsheets:   ACT:      No data to display           MMRC:     No data to display           Epworth:      No data to display           Tests:   FENO:  No results found for: "NITRICOXIDE"  PFT:     No data to display           WALK:      No data to display           Imaging: Personally reviewed and as per EMR discussion this note No results found.  Lab Results: Personally reviewed, notably eosinophils 300 earlier in 2023 CBC    Component Value Date/Time   WBC 6.3 08/30/2021 1529   RBC 4.17 08/30/2021 1529   HGB 12.8 08/30/2021 1529   HCT 38.3 08/30/2021 1529   PLT 324.0 08/30/2021 1529   MCV 91.7 08/30/2021 1529   MCH 30.6 05/30/2020 0000   MCHC 33.5 08/30/2021 1529   RDW 13.3  08/30/2021 1529   LYMPHSABS 2.1 08/30/2021 1529   MONOABS 0.6 08/30/2021 1529   EOSABS 0.3 08/30/2021 1529   BASOSABS 0.1 08/30/2021 1529    BMET    Component Value Date/Time   NA 140 08/30/2021 1529   K 4.5 08/30/2021 1529   CL 101 08/30/2021 1529   CO2 30 08/30/2021 1529   GLUCOSE 103 (H) 08/30/2021 1529   BUN 23 08/30/2021 1529   CREATININE 1.10 08/30/2021 1529   CREATININE 1.04 05/30/2020 0000   CALCIUM 9.8 08/30/2021 1529   CALCIUM 10.3 08/13/2011 0912   GFRNONAA 59 (L) 05/30/2020 0000   GFRAA 68 05/30/2020 0000    BNP No results found for: "BNP"  ProBNP No results found for: "PROBNP"  Specialty Problems       Pulmonary Problems   Cough   Chronic allergic rhinitis   Loud snoring   Chronic sinusitis   OSA (obstructive sleep apnea)    Allergies  Allergen Reactions   Flonase [Fluticasone] Nausea And Vomiting    Immunization History  Administered Date(s) Administered   Influenza  Inj Mdck Quad Pf 01/16/2018   Influenza,inj,Quad PF,6+ Mos 01/28/2019, 03/13/2021   Influenza-Unspecified 03/08/2020   PFIZER Comirnaty(Gray Top)Covid-19 Tri-Sucrose Vaccine 06/23/2020   PFIZER(Purple Top)SARS-COV-2 Vaccination 07/15/2019, 08/09/2019   Tdap 08/13/2011    Past Medical History:  Diagnosis Date   Depression    GERD (gastroesophageal reflux disease)    Hyperlipidemia    Hyperparathyroidism    Osteopenia    Sleep apnea    Stress incontinence     Tobacco History: Social History   Tobacco Use  Smoking Status Former   Packs/day: 0.10   Years: 10.00   Total pack years: 1.00   Types: Cigarettes   Quit date: 07/27/2018   Years since quitting: 3.8  Smokeless Tobacco Never   Counseling given: Not Answered   Continue to not smoke  Outpatient Encounter Medications as of 05/15/2022  Medication Sig   atorvastatin (LIPITOR) 40 MG tablet Take 1 tablet by mouth at bedtime.   benzonatate (TESSALON) 200 MG capsule Take 1 capsule (200 mg total) by mouth 3  (three) times daily as needed.   budesonide-formoterol (SYMBICORT) 160-4.5 MCG/ACT inhaler Inhale 2 puffs into the lungs 2 (two) times daily.   buPROPion (WELLBUTRIN XL) 150 MG 24 hr tablet Take 1 tablet by mouth daily.   buPROPion (WELLBUTRIN XL) 150 MG 24 hr tablet Take 1 tablet by mouth daily.   buPROPion (WELLBUTRIN XL) 300 MG 24 hr tablet Take 1 tablet (300 mg total) by mouth daily.   calcitRIOL (ROCALTROL) 0.5 MCG capsule Take 3 capsules (1.5 mcg total) by mouth daily.   cetirizine (ZYRTEC) 10 MG tablet Take 1 tablet (10 mg total) by mouth daily.   citalopram (CELEXA) 40 MG tablet Take 1 tablet by mouth daily.   famotidine (PEPCID) 20 MG tablet Take 1 tablet (20 mg total) by mouth 2 (two) times daily.   levothyroxine (SYNTHROID) 150 MCG tablet Take 1 tablet by mouth daily before breakfast.   montelukast (SINGULAIR) 10 MG tablet TAKE 1 TABLET(10 MG) BY MOUTH AT BEDTIME   omeprazole (PRILOSEC) 40 MG capsule Take 1 capsule (40 mg total) by mouth in the morning and at bedtime.   rOPINIRole (REQUIP) 1 MG tablet Take 1 tablet (1 mg total) by mouth at bedtime.   Semaglutide-Weight Management (WEGOVY) 0.25 MG/0.5ML SOAJ Inject 0.25 mg into the skin once a week.   tiZANidine (ZANAFLEX) 4 MG tablet Take 1 tablet by mouth every 6 hours as needed for muscle spasms.   zolpidem (AMBIEN) 10 MG tablet Take 1 tablet by mouth at bedtime as needed.   No facility-administered encounter medications on file as of 05/15/2022.     Review of Systems  Review of Systems  N/a Physical Exam  BP 128/70 (BP Location: Left Arm, Patient Position: Sitting, Cuff Size: Normal)   Pulse 77   Ht 5\' 2"  (1.575 m)   Wt 264 lb 3.2 oz (119.8 kg)   SpO2 97%   BMI 48.32 kg/m   Wt Readings from Last 5 Encounters:  05/15/22 264 lb 3.2 oz (119.8 kg)  04/02/22 255 lb 9.6 oz (115.9 kg)  02/06/22 254 lb 4 oz (115.3 kg)  01/16/22 254 lb (115.2 kg)  12/17/21 251 lb (113.9 kg)    BMI Readings from Last 5 Encounters:   05/15/22 48.32 kg/m  04/02/22 46.75 kg/m  02/06/22 46.50 kg/m  01/16/22 46.46 kg/m  12/17/21 45.91 kg/m     Physical Exam General: Sitting in chair, no acute distress Eyes: EOMI, no icterus Neck: Supple no JVP Pulmonary:  Clear, normal work of breathing Cardiovascular warm no edema Abdomen: Nondistended, bowel sounds present MSK: No synovitis, no joint effusion Neuro: Normal gait, no weakness Psych: Normal mood, full affect   Assessment & Plan:   Wheeze, cough: Following likely RSV infection (grandchildren tested positive) around Thanksgiving 2023. S/p antibiotics with ongoing symptoms. In setting of chronic cough. Suspect underlying asthma vs just post viral cough syndrome.  Testing obtained 03/2022 clear.  Extended prednisone taper improved symptoms.  Symbicort did as well.  Dyspnea on exertion: Likely multifactorial related to deconditioning after prolonged illness as well as possible underlying asthma.  Resume Symbicort 2 puff twice daily.  Assess response.  OSA: She reports good adherence to CPAP therapy this is greatly improved her life, sleep, she is benefiting from this.  Recent issues with mask.  Instructed her to contact DME company.  In addition, will order mask fitting to assist.   Return in about 3 months (around 08/14/2022).   Lanier Clam, MD 05/15/2022

## 2022-05-24 ENCOUNTER — Encounter: Payer: Self-pay | Admitting: Family Medicine

## 2022-05-28 ENCOUNTER — Telehealth: Payer: Self-pay

## 2022-05-28 NOTE — Telephone Encounter (Signed)
Form for [facility] pa for pt Rx Wegovy placed in Dr Birdie Riddle to be signed folder once signed will fax back to [facility]

## 2022-05-30 NOTE — Telephone Encounter (Signed)
Signed and returned to [facility] 

## 2022-05-30 NOTE — Telephone Encounter (Signed)
Faxed form and I have the original at my desk in folder . Copy in scan

## 2022-06-08 DIAGNOSIS — G4733 Obstructive sleep apnea (adult) (pediatric): Secondary | ICD-10-CM | POA: Diagnosis not present

## 2022-06-10 ENCOUNTER — Ambulatory Visit (HOSPITAL_BASED_OUTPATIENT_CLINIC_OR_DEPARTMENT_OTHER): Payer: BC Managed Care – PPO | Attending: Pulmonary Disease | Admitting: Radiology

## 2022-06-10 DIAGNOSIS — G4733 Obstructive sleep apnea (adult) (pediatric): Secondary | ICD-10-CM

## 2022-06-11 ENCOUNTER — Telehealth: Payer: Self-pay

## 2022-06-11 ENCOUNTER — Other Ambulatory Visit: Payer: Self-pay

## 2022-06-11 MED ORDER — WEGOVY 0.25 MG/0.5ML ~~LOC~~ SOAJ: 0.2500 mg | SUBCUTANEOUS | 1 refills | Status: DC

## 2022-06-11 NOTE — Telephone Encounter (Signed)
We received a letter dated from 06/02/22 from [facility] stating they denied her Wegovy . Called pt and she states she received an email stating it was approved . She states she Is having trouble finding a pharmacy with the medication . I text ed  [facility] the [facility] rep to see if we can get it thru [facility] for pt . Once he responds I will call the pt back

## 2022-06-11 NOTE — Telephone Encounter (Signed)
Received denial due to:   Also received a new PA form requesting additional documentation, dated after above denial. I sent in the PA form along with chart notes. PA is currently denied, but hopefully they will receive this new info and approve it.

## 2022-07-03 ENCOUNTER — Encounter: Payer: Self-pay | Admitting: Family Medicine

## 2022-07-03 MED ORDER — WEGOVY 0.5 MG/0.5ML ~~LOC~~ SOAJ: 0.5000 mg | SUBCUTANEOUS | 1 refills | Status: DC

## 2022-07-04 DIAGNOSIS — R490 Dysphonia: Secondary | ICD-10-CM | POA: Diagnosis not present

## 2022-07-04 DIAGNOSIS — J383 Other diseases of vocal cords: Secondary | ICD-10-CM | POA: Diagnosis not present

## 2022-07-04 DIAGNOSIS — J387 Other diseases of larynx: Secondary | ICD-10-CM | POA: Diagnosis not present

## 2022-07-04 DIAGNOSIS — Z720 Tobacco use: Secondary | ICD-10-CM | POA: Diagnosis not present

## 2022-07-04 DIAGNOSIS — J385 Laryngeal spasm: Secondary | ICD-10-CM | POA: Diagnosis not present

## 2022-07-07 DIAGNOSIS — G4733 Obstructive sleep apnea (adult) (pediatric): Secondary | ICD-10-CM | POA: Diagnosis not present

## 2022-07-10 DIAGNOSIS — R490 Dysphonia: Secondary | ICD-10-CM | POA: Diagnosis not present

## 2022-07-10 DIAGNOSIS — J383 Other diseases of vocal cords: Secondary | ICD-10-CM | POA: Diagnosis not present

## 2022-07-10 DIAGNOSIS — J387 Other diseases of larynx: Secondary | ICD-10-CM | POA: Diagnosis not present

## 2022-07-10 DIAGNOSIS — J385 Laryngeal spasm: Secondary | ICD-10-CM | POA: Diagnosis not present

## 2022-07-10 DIAGNOSIS — Z720 Tobacco use: Secondary | ICD-10-CM | POA: Diagnosis not present

## 2022-08-07 ENCOUNTER — Encounter (INDEPENDENT_AMBULATORY_CARE_PROVIDER_SITE_OTHER): Payer: BC Managed Care – PPO | Admitting: Family Medicine

## 2022-08-07 DIAGNOSIS — G4733 Obstructive sleep apnea (adult) (pediatric): Secondary | ICD-10-CM | POA: Diagnosis not present

## 2022-08-07 MED ORDER — WEGOVY 1 MG/0.5ML ~~LOC~~ SOAJ: 1.0000 mg | SUBCUTANEOUS | 0 refills | Status: DC

## 2022-08-07 NOTE — Telephone Encounter (Signed)
[facility] VISIT   Patient agreed to [facility] visit and is aware that copayment and coinsurance may apply. Patient was treated using telemedicine according to accepted telemedicine protocols.  Subjective:   Patient complains of obesity  Patient Active Problem List   Diagnosis Date Noted   OSA (obstructive sleep apnea) 02/10/2022   Chronic hoarseness 08/30/2021   Trichomoniasis Dx/Tx 09/2020 10/12/2020   Non-restorative sleep 03/01/2019   Change in voice 11/25/2017   Chronic sinusitis 11/25/2017   Osteopenia 11/25/2017   Hypersomnolence 11/25/2017   HNP (herniated nucleus pulposus), lumbar 03/07/2017   Back pain 03/05/2017   Intractable low back pain 03/04/2017   At risk for obstructive sleep apnea 02/19/2017   Loud snoring 02/19/2017   Encounter for long-term (current) use of medications 02/19/2017   History of hyperparathyroidism 11/06/2016   Chronic allergic rhinitis 11/06/2016   Vitamin D deficiency 11/06/2016   Insomnia 11/06/2016   History of osteopenia 11/06/2016   Elevated alanine aminotransferase (ALT) level 04/17/2016   Severe episode of recurrent major depressive disorder, without psychotic features 04/15/2016   Post-operative hypothyroidism 04/15/2016   Encounter for monitoring statin therapy 05/19/2015   Morbid obesity due to excess calories 05/19/2015   Cough 08/13/2011   Hyperlipidemia 08/13/2011   Stress incontinence 08/13/2011   Weight gain 01/29/2011   GERD (gastroesophageal reflux disease) 05/04/2010   RESTLESS LEG SYNDROME 03/30/2010   SKIN LESIONS, MULTIPLE 11/22/2008   Chronic depression 11/04/2008   Social History   Tobacco Use   Smoking status: Former    Packs/day: 0.10    Years: 10.00    Additional pack years: 0.00    Total pack years: 1.00    Types: Cigarettes    Quit date: 07/27/2018    Years since quitting: 4.0   Smokeless tobacco: Never  Substance Use Topics   Alcohol use: Yes    Alcohol/week: 14.0 standard drinks of alcohol    Types:  14 Glasses of wine per week    Comment: wine    Current Outpatient Medications:    Semaglutide-Weight Management (WEGOVY) 1 MG/0.5ML SOAJ, Inject 1 mg into the skin once a week., Disp: 2 mL, Rfl: 0   atorvastatin (LIPITOR) 40 MG tablet, Take 1 tablet by mouth at bedtime., Disp: 90 tablet, Rfl: 0   benzonatate (TESSALON) 200 MG capsule, Take 1 capsule (200 mg total) by mouth 3 (three) times daily as needed., Disp: 45 capsule, Rfl: 1   budesonide-formoterol (SYMBICORT) 160-4.5 MCG/ACT inhaler, Inhale 2 puffs into the lungs 2 (two) times daily., Disp: 1 each, Rfl: 12   buPROPion (WELLBUTRIN XL) 150 MG 24 hr tablet, Take 1 tablet by mouth daily., Disp: 90 tablet, Rfl: 0   buPROPion (WELLBUTRIN XL) 150 MG 24 hr tablet, Take 1 tablet by mouth daily., Disp: 90 tablet, Rfl: 0   buPROPion (WELLBUTRIN XL) 300 MG 24 hr tablet, Take 1 tablet (300 mg total) by mouth daily., Disp: 90 tablet, Rfl: 1   calcitRIOL (ROCALTROL) 0.5 MCG capsule, Take 3 capsules (1.5 mcg total) by mouth daily., Disp: 270 capsule, Rfl: 1   cetirizine (ZYRTEC) 10 MG tablet, Take 1 tablet (10 mg total) by mouth daily., Disp: 90 tablet, Rfl: 3   citalopram (CELEXA) 40 MG tablet, Take 1 tablet by mouth daily., Disp: 90 tablet, Rfl: 0   famotidine (PEPCID) 20 MG tablet, Take 1 tablet (20 mg total) by mouth 2 (two) times daily., Disp: 180 tablet, Rfl: 3   levothyroxine (SYNTHROID) 150 MCG tablet, Take 1 tablet by mouth daily before breakfast.,  Disp: 90 tablet, Rfl: 0   montelukast (SINGULAIR) 10 MG tablet, TAKE 1 TABLET(10 MG) BY MOUTH AT BEDTIME, Disp: 90 tablet, Rfl: 1   omeprazole (PRILOSEC) 40 MG capsule, Take 1 capsule (40 mg total) by mouth in the morning and at bedtime., Disp: 180 capsule, Rfl: 3   rOPINIRole (REQUIP) 1 MG tablet, Take 1 tablet (1 mg total) by mouth at bedtime., Disp: 90 tablet, Rfl: 3   tiZANidine (ZANAFLEX) 4 MG tablet, Take 1 tablet by mouth every 6 hours as needed for muscle spasms., Disp: 90 tablet, Rfl: 0    zolpidem (AMBIEN) 10 MG tablet, Take 1 tablet by mouth at bedtime as needed., Disp: 90 tablet, Rfl: 0  Allergies  Allergen Reactions   Flonase [Fluticasone] Nausea And Vomiting    Assessment and Plan:   Diagnosis: obesity. Please see myChart communication and orders below.   No orders of the defined types were placed in this encounter.  Meds ordered this encounter  Medications   Semaglutide-Weight Management (WEGOVY) 1 MG/0.5ML SOAJ    Sig: Inject 1 mg into the skin once a week.    Dispense:  2 mL    Refill:  0    Neena Rhymes, MD 08/07/2022  A total of 6 minutes were spent by me to personally review the patient-generated inquiry, review patient records and data pertinent to assessment of the patient's problem, develop a management plan including generation of prescriptions and/or orders, and on subsequent communication with the patient through secure the MyChart portal service.   There is no separately reported E/M service related to this service in the past 7 days nor does the patient have an upcoming soonest available appointment for this issue. This work was completed in less than 7 days.   The patient consented to this service today (see patient agreement prior to ongoing communication). Patient counseled regarding the need for in-person exam for certain conditions and was advised to call the office if any changing or worsening symptoms occur.   The codes to be used for the E/M service are: [x]   99421 for 5-10 minutes of time spent on the inquiry. []   [id number] for 11-20 minutes. []   99423 for 21+ minutes.

## 2022-08-14 ENCOUNTER — Ambulatory Visit: Payer: BC Managed Care – PPO | Admitting: Family Medicine

## 2022-08-19 ENCOUNTER — Other Ambulatory Visit: Payer: Self-pay

## 2022-08-19 DIAGNOSIS — F332 Major depressive disorder, recurrent severe without psychotic features: Secondary | ICD-10-CM

## 2022-08-19 MED ORDER — BUPROPION HCL ER (XL) 300 MG PO TB24: 300.0000 mg | ORAL_TABLET | Freq: Every day | ORAL | 1 refills | Status: DC

## 2022-08-22 ENCOUNTER — Encounter: Payer: Self-pay | Admitting: Family Medicine

## 2022-08-22 ENCOUNTER — Ambulatory Visit (INDEPENDENT_AMBULATORY_CARE_PROVIDER_SITE_OTHER): Payer: BC Managed Care – PPO | Admitting: Family Medicine

## 2022-08-22 VITALS — BP 130/80 | HR 77 | Temp 97.9°F | Resp 18 | Ht 62.0 in | Wt 243.4 lb

## 2022-08-22 DIAGNOSIS — E559 Vitamin D deficiency, unspecified: Secondary | ICD-10-CM | POA: Diagnosis not present

## 2022-08-22 DIAGNOSIS — E890 Postprocedural hypothyroidism: Secondary | ICD-10-CM

## 2022-08-22 DIAGNOSIS — E782 Mixed hyperlipidemia: Secondary | ICD-10-CM | POA: Diagnosis not present

## 2022-08-22 DIAGNOSIS — Z23 Encounter for immunization: Secondary | ICD-10-CM

## 2022-08-22 DIAGNOSIS — F332 Major depressive disorder, recurrent severe without psychotic features: Secondary | ICD-10-CM | POA: Diagnosis not present

## 2022-08-22 LAB — HEPATIC FUNCTION PANEL
ALT: 29 U/L (ref 0–35)
AST: 19 U/L (ref 0–37)
Albumin: 4.3 g/dL (ref 3.5–5.2)
Alkaline Phosphatase: 93 U/L (ref 39–117)
Bilirubin, Direct: 0.1 mg/dL (ref 0.0–0.3)
Total Bilirubin: 0.7 mg/dL (ref 0.2–1.2)
Total Protein: 7.3 g/dL (ref 6.0–8.3)

## 2022-08-22 LAB — BASIC METABOLIC PANEL
BUN: 10 mg/dL (ref 6–23)
CO2: 30 mEq/L (ref 19–32)
Calcium: 10 mg/dL (ref 8.4–10.5)
Chloride: 100 mEq/L (ref 96–112)
Creatinine, Ser: 1.03 mg/dL (ref 0.40–1.20)
GFR: 58.5 mL/min — ABNORMAL LOW (ref >60.00)
Glucose, Bld: 89 mg/dL (ref 70–99)
Potassium: 4.2 mEq/L (ref 3.5–5.1)
Sodium: 139 mEq/L (ref 135–145)

## 2022-08-22 LAB — LIPID PANEL
Cholesterol: 106 mg/dL (ref 0–200)
HDL: 48.9 mg/dL (ref >39.00)
LDL Cholesterol: 41 mg/dL (ref 0–99)
NonHDL: 57
Total CHOL/HDL Ratio: 2
Triglycerides: 81 mg/dL (ref 0.0–149.0)
VLDL: 16.2 mg/dL (ref 0.0–40.0)

## 2022-08-22 LAB — VITAMIN D 25 HYDROXY (VIT D DEFICIENCY, FRACTURES): VITD: 18.44 ng/mL — ABNORMAL LOW (ref 30.00–100.00)

## 2022-08-22 LAB — CBC WITH DIFFERENTIAL/PLATELET
Basophils Absolute: 0.1 10*3/uL (ref 0.0–0.1)
Basophils Relative: 1.1 % (ref 0.0–3.0)
Eosinophils Absolute: 0.2 10*3/uL (ref 0.0–0.7)
Eosinophils Relative: 2.8 % (ref 0.0–5.0)
HCT: 42.1 % (ref 36.0–46.0)
Hemoglobin: 14.2 g/dL (ref 12.0–15.0)
Lymphocytes Relative: 30.8 % (ref 12.0–46.0)
Lymphs Abs: 2 10*3/uL (ref 0.7–4.0)
MCHC: 33.7 g/dL (ref 30.0–36.0)
MCV: 91.3 fl (ref 78.0–100.0)
Monocytes Absolute: 0.5 10*3/uL (ref 0.1–1.0)
Monocytes Relative: 8.1 % (ref 3.0–12.0)
Neutro Abs: 3.7 10*3/uL (ref 1.4–7.7)
Neutrophils Relative %: 57.2 % (ref 43.0–77.0)
Platelets: 363 10*3/uL (ref 150.0–400.0)
RBC: 4.62 Mil/uL (ref 3.87–5.11)
RDW: 12.3 % (ref 11.5–15.5)
WBC: 6.5 10*3/uL (ref 4.0–10.5)

## 2022-08-22 LAB — TSH: TSH: 0.11 u[IU]/mL — ABNORMAL LOW (ref 0.35–5.50)

## 2022-08-22 MED ORDER — ZOLPIDEM TARTRATE 10 MG PO TABS: ORAL_TABLET | ORAL | 0 refills | Status: DC

## 2022-08-22 MED ORDER — ATORVASTATIN CALCIUM 40 MG PO TABS: 40.0000 mg | ORAL_TABLET | Freq: Every day | ORAL | 0 refills | Status: DC

## 2022-08-22 MED ORDER — BUPROPION HCL ER (XL) 300 MG PO TB24: 300.0000 mg | ORAL_TABLET | Freq: Every day | ORAL | 1 refills | Status: DC

## 2022-08-22 MED ORDER — CALCITRIOL 0.5 MCG PO CAPS
1.5000 ug | ORAL_CAPSULE | Freq: Every day | ORAL | 1 refills | Status: DC
Start: 2022-08-22 — End: 2023-02-17

## 2022-08-22 MED ORDER — OMEPRAZOLE 40 MG PO CPDR: 40.0000 mg | DELAYED_RELEASE_CAPSULE | Freq: Two times a day (BID) | ORAL | 3 refills | Status: DC

## 2022-08-22 MED ORDER — ROPINIROLE HCL 1 MG PO TABS: 1.0000 mg | ORAL_TABLET | Freq: Every day | ORAL | 3 refills | Status: DC

## 2022-08-22 MED ORDER — TIZANIDINE HCL 4 MG PO TABS: 4.0000 mg | ORAL_TABLET | Freq: Four times a day (QID) | ORAL | 0 refills | Status: DC | PRN

## 2022-08-22 MED ORDER — LEVOTHYROXINE SODIUM 150 MCG PO TABS: 150.0000 ug | ORAL_TABLET | Freq: Every day | ORAL | 0 refills | Status: DC

## 2022-08-22 MED ORDER — WEGOVY 1.7 MG/0.75ML ~~LOC~~ SOAJ: 1.7000 mg | SUBCUTANEOUS | 1 refills | Status: DC

## 2022-08-22 NOTE — Assessment & Plan Note (Signed)
Chronic problem.  Currently on Lipitor 40mg daily w/o difficulty.  Check labs.  Adjust meds prn  

## 2022-08-22 NOTE — Assessment & Plan Note (Signed)
Pt is down 21 lbs since January on Wegovy.  Is pleased w/ the results and would like to increase her dose.  New prescription for 1.7mg  weekly sent to pharmacy.

## 2022-08-22 NOTE — Assessment & Plan Note (Signed)
Chronic problem.  Currently on Levothyroxine daily.  Check labs.  Adjust meds prn.

## 2022-08-22 NOTE — Patient Instructions (Signed)
Schedule your complete physical in 6 months We'll notify you of your lab results and make any changes if needed Continue to work on healthy diet and regular exercise- you're doing great! INCREASE the Wegovy to 1.7mg  weekly Call with any questions or concerns HAPPY EARLY BIRTHDAY!!!

## 2022-08-22 NOTE — Progress Notes (Signed)
   Subjective:    Patient ID: Allison Reyes, female    DOB: [birth year], 62 y.o.   MRN: [id number]  HPI Hyperlipidemia- chronic problem, on Lipitor .  No CP, SOB, abd pain, N/V.  Depression- chronic problem, on Wellbutrin  daily.  Mood is better w/ some weight loss.    Hypothyroid- chronic problem, on Levothyroxine daily.  Denies changes to skin/hair/nails.  Obesity- pt is down 21 lbs since January.  On Wegovy  weekly.  Pt doesn't feel any different.  Pt reports she is rarely hungry, eating a lot less.  Pt would like to increase dose.  Pt is planning to add walking to her routine.     Review of Systems For ROS see HPI     Objective:   Physical Exam Vitals reviewed.  Constitutional:      General: She is not in acute distress.    Appearance: Normal appearance. She is well-developed. She is obese. She is not ill-appearing.  HENT:     Head: Normocephalic and atraumatic.  Eyes:     Conjunctiva/sclera: Conjunctivae normal.     Pupils: Pupils are equal, round, and reactive to light.  Neck:     Thyroid: No thyromegaly.  Cardiovascular:     Rate and Rhythm: Normal rate and regular rhythm.     Pulses: Normal pulses.     Heart sounds: Normal heart sounds. No murmur heard. Pulmonary:     Effort: Pulmonary effort is normal. No respiratory distress.     Breath sounds: Normal breath sounds.  Abdominal:     General: There is no distension.     Palpations: Abdomen is soft.     Tenderness: There is no abdominal tenderness.  Musculoskeletal:     Cervical back: Normal range of motion and neck supple.     Right lower leg: No edema.     Left lower leg: No edema.  Lymphadenopathy:     Cervical: No cervical adenopathy.  Skin:    General: Skin is warm and dry.  Neurological:     General: No focal deficit present.     Mental Status: She is alert and oriented to person, place, and time.  Psychiatric:        Mood and Affect: Mood normal.        Behavior: Behavior normal.         Thought Content: Thought content normal.          Assessment & Plan:

## 2022-08-22 NOTE — Assessment & Plan Note (Signed)
Ongoing issue.  Mood is better w/ recent weight loss.  Refill on Wellbutrin provided.

## 2022-08-23 ENCOUNTER — Other Ambulatory Visit: Payer: Self-pay

## 2022-08-23 ENCOUNTER — Telehealth: Payer: Self-pay

## 2022-08-23 DIAGNOSIS — E559 Vitamin D deficiency, unspecified: Secondary | ICD-10-CM

## 2022-08-23 DIAGNOSIS — E039 Hypothyroidism, unspecified: Secondary | ICD-10-CM

## 2022-08-23 MED ORDER — VITAMIN D (ERGOCALCIFEROL) 1.25 MG (50000 UNIT) PO CAPS
50000.0000 [IU] | ORAL_CAPSULE | ORAL | 12 refills | Status: DC
Start: 2022-08-23 — End: 2023-07-08

## 2022-08-23 MED ORDER — LEVOTHYROXINE SODIUM 137 MCG PO TABS
137.0000 ug | ORAL_TABLET | Freq: Every day | ORAL | 3 refills | Status: DC
Start: 2022-08-23 — End: 2022-11-29

## 2022-08-23 NOTE — Telephone Encounter (Signed)
-----   Message from Sheliah Hatch, MD sent at 08/23/2022  9:06 AM EDT ----- Vit D is low.  Based on this, we need to start 50,000 units weekly x12 weeks in addition to daily OTC supplement of at least 2000 units.   TSH is slightly low which means your levothyroxine dose is too high.  We will decrease to daily (#30, 3 refills) and repeat your TSH at a lab only visit in 1 month (dx hypothyroid)  Remainder of labs look great!

## 2022-08-23 NOTE — Telephone Encounter (Signed)
Left pt a VM to call office in regards to lab results . Vit d 50,000 units , and Levothyroxine 137 # 30 has been sent to pharmacy . TSH order is in pt needs a one month lab only visit to repeat TSH

## 2022-08-28 ENCOUNTER — Other Ambulatory Visit: Payer: Self-pay | Admitting: Family Medicine

## 2022-09-06 DIAGNOSIS — G4733 Obstructive sleep apnea (adult) (pediatric): Secondary | ICD-10-CM | POA: Diagnosis not present

## 2022-09-12 ENCOUNTER — Other Ambulatory Visit: Payer: Self-pay

## 2022-09-12 DIAGNOSIS — Z1231 Encounter for screening mammogram for malignant neoplasm of breast: Secondary | ICD-10-CM

## 2022-09-12 DIAGNOSIS — Z1211 Encounter for screening for malignant neoplasm of colon: Secondary | ICD-10-CM

## 2022-09-24 DIAGNOSIS — Z1211 Encounter for screening for malignant neoplasm of colon: Secondary | ICD-10-CM | POA: Diagnosis not present

## 2022-10-02 ENCOUNTER — Telehealth: Payer: Self-pay

## 2022-10-02 ENCOUNTER — Other Ambulatory Visit: Payer: Self-pay

## 2022-10-02 ENCOUNTER — Encounter: Payer: Self-pay | Admitting: Family Medicine

## 2022-10-02 DIAGNOSIS — R195 Other fecal abnormalities: Secondary | ICD-10-CM

## 2022-10-02 LAB — COLOGUARD: COLOGUARD: POSITIVE — AB

## 2022-10-02 MED ORDER — WEGOVY 2.4 MG/0.75ML ~~LOC~~ SOAJ: 2.4000 mg | SUBCUTANEOUS | 3 refills | Status: DC

## 2022-10-02 NOTE — Telephone Encounter (Signed)
Pt is aware of lab results . I have placed a referral for GI .

## 2022-10-02 NOTE — Telephone Encounter (Signed)
-----   Message from Sheliah Hatch, MD sent at 10/02/2022  7:20 AM EDT ----- This time your cologuard is positive.  It does not mean that you have colon cancer, but it does mean we need to investigate further w/ a GI referral and colonoscopy.  We will place the referral (dx positive cologuard) and they will call you to schedule.

## 2022-10-07 DIAGNOSIS — G4733 Obstructive sleep apnea (adult) (pediatric): Secondary | ICD-10-CM | POA: Diagnosis not present

## 2022-10-24 ENCOUNTER — Other Ambulatory Visit: Payer: Self-pay | Admitting: Family Medicine

## 2022-11-01 NOTE — Telephone Encounter (Signed)
I spoke to the pt and informed her that when we sent in the Rx on 10/02/22 it had 3 refills on it . She is reaching out to pharmacy

## 2022-11-06 DIAGNOSIS — G4733 Obstructive sleep apnea (adult) (pediatric): Secondary | ICD-10-CM | POA: Diagnosis not present

## 2022-11-29 ENCOUNTER — Other Ambulatory Visit: Payer: Self-pay | Admitting: Family Medicine

## 2022-11-29 DIAGNOSIS — E039 Hypothyroidism, unspecified: Secondary | ICD-10-CM

## 2022-12-07 DIAGNOSIS — G4733 Obstructive sleep apnea (adult) (pediatric): Secondary | ICD-10-CM | POA: Diagnosis not present

## 2022-12-17 ENCOUNTER — Ambulatory Visit: Payer: BC Managed Care – PPO

## 2022-12-17 ENCOUNTER — Telehealth: Payer: Self-pay | Admitting: *Deleted

## 2022-12-17 NOTE — Telephone Encounter (Signed)
Attempt to reach pt for pre-visit. LM with call back #. Will attempt other number in profile Home # tried and female who picked up stated it was "Wrong number". Will attempt first # again in 5 minutes 2nd attempt to reach pt with first # called only rang.

## 2022-12-17 NOTE — Progress Notes (Unsigned)
Pt's name and DOB verified at the beginning of the pre-visit.  Pt denies any difficulty with ambulating,sitting, laying down or rolling side to side Gave both [facility] main # and MD on call # prior to instructions.  No egg or soy allergy known to patient  No issues known to pt with past sedation with any surgeries or procedures Pt denies having issues being intubated Patient denies ever being intubated Pt has no issues moving head neck or swallowing No FH of Malignant Hyperthermia Pt is not on diet pills Pt is not on home 02  Pt is not on blood thinners  Pt denies issues with constipation  Pt has frequent issues with constipation RN instructed pt to use Miralax per bottles instructions a week before prep days. Pt states they will Pt is not on dialysis Pt denise any abnormal heart rhythms  Pt denies any upcoming cardiac testing Pt encouraged to use to use Singlecare or Goodrx to reduce cost  Patient's chart reviewed by John Nulty CNRA prior to pre-visit and patient appropriate for the [facility].  Pre-visit completed and red dot placed by patient's name on their procedure day (on provider's schedule).  . Visit by phone Pt states weight is  Instructed pt why it is important to and  to call if they have any changes in health or new medications. Directed them to the # given and on instructions.   Pt states they will.  Instructions reviewed with pt and pt states understanding. Instructed to review again prior to procedure. Pt states they will.  Instructions sent by mail with coupon and by my chart   

## 2022-12-22 ENCOUNTER — Other Ambulatory Visit: Payer: Self-pay | Admitting: Family Medicine

## 2022-12-25 ENCOUNTER — Ambulatory Visit (INDEPENDENT_AMBULATORY_CARE_PROVIDER_SITE_OTHER): Payer: BC Managed Care – PPO

## 2022-12-25 DIAGNOSIS — Z1231 Encounter for screening mammogram for malignant neoplasm of breast: Secondary | ICD-10-CM

## 2022-12-26 ENCOUNTER — Ambulatory Visit (AMBULATORY_SURGERY_CENTER): Payer: BC Managed Care – PPO | Admitting: *Deleted

## 2022-12-26 VITALS — Ht 62.0 in | Wt 214.0 lb

## 2022-12-26 DIAGNOSIS — R195 Other fecal abnormalities: Secondary | ICD-10-CM

## 2022-12-26 MED ORDER — NA SULFATE-K SULFATE-MG SULF 17.5-3.13-1.6 GM/177ML PO SOLN: 1.0000 | Freq: Once | ORAL | 0 refills | Status: AC

## 2022-12-26 NOTE — Progress Notes (Signed)
Pt's name and DOB verified at the beginning of the pre-visit.  Pt denies any difficulty with ambulating,sitting, laying down or rolling side to side Gave both [facility] main # and MD on call # prior to instructions.  No egg or soy allergy known to patient  No issues known to pt with past sedation with any surgeries or procedures Patient denies ever being intubated Pt has no issues moving head neck or swallowing No FH of Malignant Hyperthermia Pt is not on diet pills Pt is not on home 02  Pt is not on blood thinners  Pt denies issues with constipation  Pt is not on dialysis Pt denise any abnormal heart rhythms  Pt denies any upcoming cardiac testing Pt encouraged to use to use Singlecare or Goodrx to reduce cost  Patient's chart reviewed by Cathlyn Parsons CNRA prior to pre-visit and patient appropriate for the [facility].  Pre-visit completed and red dot placed by patient's name on their procedure day (on provider's schedule).  . Visit in person Pt states weight is 214 LB Instructed pt why it is important to and  to call if they have any changes in health or new medications. Directed them to the # given and on instructions.   Pt states they will.  Instructions reviewed with pt and pt states understanding. Instructed to review again prior to procedure. Pt states they will.  Instructions given to pt  with coupon and by my chart

## 2022-12-27 ENCOUNTER — Encounter: Payer: Self-pay | Admitting: Gastroenterology

## 2023-01-01 ENCOUNTER — Other Ambulatory Visit: Payer: Self-pay

## 2023-01-01 MED ORDER — WEGOVY 2.4 MG/0.75ML ~~LOC~~ SOAJ: 2.4000 mg | SUBCUTANEOUS | 3 refills | Status: DC

## 2023-01-07 ENCOUNTER — Other Ambulatory Visit: Payer: Self-pay

## 2023-01-07 DIAGNOSIS — G4733 Obstructive sleep apnea (adult) (pediatric): Secondary | ICD-10-CM | POA: Diagnosis not present

## 2023-01-07 MED ORDER — WEGOVY 2.4 MG/0.75ML ~~LOC~~ SOAJ: 2.4000 mg | SUBCUTANEOUS | 3 refills | Status: DC

## 2023-01-09 ENCOUNTER — Encounter: Payer: Self-pay | Admitting: Gastroenterology

## 2023-01-09 ENCOUNTER — Ambulatory Visit (AMBULATORY_SURGERY_CENTER): Payer: BC Managed Care – PPO | Admitting: Gastroenterology

## 2023-01-09 VITALS — BP 128/44 | HR 80 | Temp 97.5°F | Resp 16 | Ht 62.0 in | Wt 214.0 lb

## 2023-01-09 DIAGNOSIS — D123 Benign neoplasm of transverse colon: Secondary | ICD-10-CM

## 2023-01-09 DIAGNOSIS — Z1211 Encounter for screening for malignant neoplasm of colon: Secondary | ICD-10-CM | POA: Diagnosis not present

## 2023-01-09 DIAGNOSIS — R195 Other fecal abnormalities: Secondary | ICD-10-CM | POA: Diagnosis not present

## 2023-01-09 DIAGNOSIS — D122 Benign neoplasm of ascending colon: Secondary | ICD-10-CM

## 2023-01-09 MED ORDER — SODIUM CHLORIDE 0.9 % IV SOLN: 500.0000 mL | Freq: Once | INTRAVENOUS | Status: DC

## 2023-01-09 NOTE — Progress Notes (Signed)
[facility] History and Physical   Primary Care Physician:  Sheliah Hatch, MD   Reason for Procedure:   Positive Cologuard  Plan:    Colonoscopy     HPI: Allison Reyes is a 62 y.o. female undergoing colonoscopy following a positive Cologuard test in May of this year.  She has no family history of colon cancer and no chronic lower GI symptoms.  She reports having 2 previous colonoscopies over 20 years ago to evaluate GI symptoms.  She believes these were normal.   Past Medical History:  Diagnosis Date   Depression    GERD (gastroesophageal reflux disease)    Hyperlipidemia    Hyperparathyroidism    Osteopenia    Osteoporosis    Sleep apnea    Stress incontinence    Thyroid disease     Past Surgical History:  Procedure Laterality Date   ABDOMINAL HYSTERECTOMY     TAH - endometriosis   LAPAROSCOPIC ENDOMETRIOSIS FULGURATION     7 SURGERIES   LAPAROTOMY     LUMBAR LAMINECTOMY/DECOMPRESSION MICRODISCECTOMY Left 03/07/2017   Procedure: L4-5 Discectomy;  Surgeon: Coletta Memos, MD;  Location: [facility] OR;  Service: Neurosurgery;  Laterality: Left;   NASAL SINUS SURGERY  01/03/2011   OOPHORECTOMY     parathyroid from heart     8 years ago   THYROIDECTOMY     1/2 14 YEARS AGO   THYROIDECTOMY, PARTIAL      Prior to Admission medications   Medication Sig Start Date End Date Taking? Authorizing Provider  atorvastatin (LIPITOR) 40 MG tablet Take 1 tablet (40 mg total) by mouth at bedtime. 08/22/22  Yes Sheliah Hatch, MD  budesonide-formoterol [facility]) 160-4.5 MCG/ACT inhaler Inhale 2 puffs into the lungs 2 (two) times daily. 04/18/22  Yes Hunsucker, Lesia Sago, MD  buPROPion (WELLBUTRIN XL) 300 MG 24 hr tablet Take 1 tablet (300 mg total) by mouth daily. 08/22/22  Yes Sheliah Hatch, MD  calcitRIOL (ROCALTROL) 0.5 MCG capsule Take 3 capsules (1.5 mcg total) by mouth daily. 08/22/22  Yes Sheliah Hatch, MD  cetirizine (ZYRTEC) 10 MG tablet Take 1  tablet (10 mg total) by mouth daily. 10/11/21  Yes Sheliah Hatch, MD  citalopram (CELEXA) 40 MG tablet Take 1 tablet by mouth daily. 12/23/22  Yes Sheliah Hatch, MD  levothyroxine (SYNTHROID) 137 MCG tablet TAKE 1 TABLET(137 MCG) BY MOUTH DAILY BEFORE BREAKFAST 11/29/22  Yes Sheliah Hatch, MD  montelukast (SINGULAIR) 10 MG tablet Take 1 tablet by mouth at bedtime. 10/24/22  Yes Sheliah Hatch, MD  omeprazole (PRILOSEC) 40 MG capsule Take 1 capsule (40 mg total) by mouth in the morning and at bedtime. 08/22/22  Yes Sheliah Hatch, MD  Vitamin D, Ergocalciferol, (DRISDOL) 1.25 MG (50000 UNIT) CAPS capsule Take 1 capsule (50,000 Units total) by mouth every 7 (seven) days. 08/23/22  Yes Sheliah Hatch, MD  esomeprazole (NEXIUM) 40 MG capsule Take 40 mg by mouth daily at 12 noon. Patient not taking: Reported on 12/26/2022    [provider]  rOPINIRole (REQUIP) 1 MG tablet Take 1 tablet (1 mg total) by mouth at bedtime. Patient not taking: Reported on 01/09/2023 08/22/22   Sheliah Hatch, MD  Semaglutide-Weight Management (WEGOVY) 2.4 MG/0.75ML SOAJ Inject 2.4 mg into the skin once a week. 01/07/23   Sheliah Hatch, MD  tiZANidine (ZANAFLEX) 4 MG tablet Take 1 tablet (4 mg total) by mouth every 6 (six) hours as needed for muscle spasms. 08/22/22  Sheliah Hatch, MD  zolpidem (AMBIEN) 10 MG tablet Take 1 tablet by mouth at bedtime as needed. 08/22/22   Sheliah Hatch, MD    Current Outpatient Medications  Medication Sig Dispense Refill   atorvastatin (LIPITOR) 40 MG tablet Take 1 tablet (40 mg total) by mouth at bedtime. 90 tablet 0   budesonide-formoterol (SYMBICORT) 160-4.5 MCG/ACT inhaler Inhale 2 puffs into the lungs 2 (two) times daily. 1 each 12   buPROPion (WELLBUTRIN XL) 300 MG 24 hr tablet Take 1 tablet (300 mg total) by mouth daily. 90 tablet 1   calcitRIOL (ROCALTROL) 0.5 MCG capsule Take 3 capsules (1.5 mcg total) by mouth daily. 270 capsule  1   cetirizine (ZYRTEC) 10 MG tablet Take 1 tablet (10 mg total) by mouth daily. 90 tablet 3   citalopram (CELEXA) 40 MG tablet Take 1 tablet by mouth daily. 90 tablet 0   levothyroxine (SYNTHROID) 137 MCG tablet TAKE 1 TABLET(137 MCG) BY MOUTH DAILY BEFORE BREAKFAST 30 tablet 3   montelukast (SINGULAIR) 10 MG tablet Take 1 tablet by mouth at bedtime. 90 tablet 0   omeprazole (PRILOSEC) 40 MG capsule Take 1 capsule (40 mg total) by mouth in the morning and at bedtime. 180 capsule 3   Vitamin D, Ergocalciferol, (DRISDOL) 1.25 MG (50000 UNIT) CAPS capsule Take 1 capsule (50,000 Units total) by mouth every 7 (seven) days. 7 capsule 12   esomeprazole (NEXIUM) 40 MG capsule Take 40 mg by mouth daily at 12 noon. (Patient not taking: Reported on 12/26/2022)     rOPINIRole (REQUIP) 1 MG tablet Take 1 tablet (1 mg total) by mouth at bedtime. (Patient not taking: Reported on 01/09/2023) 90 tablet 3   Semaglutide-Weight Management (WEGOVY) 2.4 MG/0.75ML SOAJ Inject 2.4 mg into the skin once a week. 3 mL 3   tiZANidine (ZANAFLEX) 4 MG tablet Take 1 tablet (4 mg total) by mouth every 6 (six) hours as needed for muscle spasms. 90 tablet 0   zolpidem (AMBIEN) 10 MG tablet Take 1 tablet by mouth at bedtime as needed. 90 tablet 0   Current Facility-Administered Medications  Medication Dose Route Frequency Provider Last Rate Last Admin   0.9 %  sodium chloride infusion  500 mL Intravenous Once Jenel Lucks, MD        Allergies as of 01/09/2023 - Review Complete 01/09/2023  Allergen Reaction Noted   Flonase [fluticasone] Nausea And Vomiting 06/26/2020    Family History  Problem Relation Age of Onset   Coronary artery disease Mother    Hypertension Mother    Alcohol abuse Mother    Depression Mother    Hypertension Father    Lung cancer Father    Stroke Paternal Grandfather    Diabetes Neg Hx    Colon cancer Neg Hx    Breast cancer Neg Hx    Colon polyps Neg Hx    Rectal cancer Neg Hx     Stomach cancer Neg Hx     Social History   Socioeconomic History   Marital status: Married    Spouse name: Not on file   Number of children: Not on file   Years of education: Not on file   Highest education level: Not on file  Occupational History   Occupation: [employer] administrator in [employer]  Tobacco Use   Smoking status: Former    Current packs/day: 0.00    Average packs/day: 0.1 packs/day for 10.0 years (1.0 ttl pk-yrs)    Types: Cigarettes  Start date: 07/26/2008    Quit date: 07/27/2018    Years since quitting: 4.4   Smokeless tobacco: Never  Vaping Use   Vaping status: Never Used  Substance and Sexual Activity   Alcohol use: Yes    Alcohol/week: 14.0 standard drinks of alcohol    Types: 14 Glasses of wine per week    Comment: wine   Drug use: No   Sexual activity: Yes    Birth control/protection: Surgical  Other Topics Concern   Not on file  Social History Narrative   Lives w/ husband, daughter, son   Social Determinants of Health   Financial Resource Strain: Not on file  Food Insecurity: Not on file  Transportation Needs: Not on file  Physical Activity: Not on file  Stress: Not on file  Social Connections: Unknown (09/09/2021)   Received from [facility]   Social Network    Social Network: Not on file  Intimate Partner Violence: Unknown (08/01/2021)   [facility] from [facility]   HITS    Physically Hurt: Not on file    Insult or Talk Down To: Not on file    Threaten Physical Harm: Not on file    Scream or Curse: Not on file    Review of Systems:  All other review of systems negative except as mentioned in the HPI.  Physical Exam: Vital signs BP 123/86   Pulse 67   Temp (!) 97.5 F (36.4 C)   Ht 5\' 2"  (1.575 m)   Wt 214 lb (97.1 kg)   SpO2 98%   BMI 39.14 kg/m   General:   Alert,  Well-developed, well-nourished, pleasant and cooperative in NAD Airway:  Mallampati 2 Lungs:  Clear  throughout to auscultation.   Heart:  Regular rate and rhythm; no murmurs, clicks, rubs,  or gallops. Abdomen:  Soft, nontender and nondistended. Normal bowel sounds.   Neuro/Psych:  Normal mood and affect. A and O x 3   Akeisha Lagerquist E. Tomasa Rand, MD [facility]

## 2023-01-09 NOTE — Progress Notes (Signed)
Report to PACU, RN, vss, BBS= Clear.  

## 2023-01-09 NOTE — Op Note (Signed)
[facility] Patient Name: Kyleen Thien Procedure Date: 01/09/2023 7:02 AM MRN: [id number] Endoscopist: Lorin Picket E. Tomasa Rand , MD, [id number] Age: 62 Referring MD:  Date of Birth: 26-Feb-1961 Gender: Female Account #: [id number] Procedure:                Colonoscopy Indications:              Positive Cologuard test Medicines:                Monitored Anesthesia Care Procedure:                Pre-Anesthesia Assessment:                           - Prior to the procedure, a History and Physical                            was performed, and patient medications and                            allergies were reviewed. The patient's tolerance of                            previous anesthesia was also reviewed. The risks                            and benefits of the procedure and the sedation                            options and risks were discussed with the patient.                            All questions were answered, and informed consent                            was obtained. Prior Anticoagulants: The patient has                            taken no anticoagulant or antiplatelet agents. ASA                            Grade Assessment: II - A patient with mild systemic                            disease. After reviewing the risks and benefits,                            the patient was deemed in satisfactory condition to                            undergo the procedure.                           After obtaining informed consent, the colonoscope  was passed under direct vision. Throughout the                            procedure, the patient's blood pressure, pulse, and                            oxygen saturations were monitored continuously. The                            Olympus CF-HQ190L ([id number]) Colonoscope was                            introduced through the anus and advanced to the the                            terminal ileum, with  identification of the                            appendiceal orifice and IC valve. The colonoscopy                            was unusually difficult due to a redundant colon                            and significant looping. Successful completion of                            the procedure was aided by using manual pressure                            and withdrawing and reinserting the scope. The                            patient tolerated the procedure well. The quality                            of the bowel preparation was good. The terminal                            ileum, ileocecal valve, appendiceal orifice, and                            rectum were photographed. The bowel preparation                            used was SUPREP via split dose instruction. Scope In: 7:58:00 AM Scope Out: 8:37:52 AM Scope Withdrawal Time: 0 hours 17 minutes 14 seconds  Total Procedure Duration: 0 hours 39 minutes 52 seconds  Findings:                 Skin tags were found on perianal exam.                           The digital rectal exam was normal.  Pertinent                            negatives include normal sphincter tone and no                            palpable rectal lesions.                           A 5 mm polyp was found in the ascending colon. The                            polyp was flat. The polyp was removed with a cold                            snare. There was a small fragment of polyp                            remaining after the snare polypectomy and                            subsequent attempts to remove it with the snare                            were unsuccessful. Forceps were then used to remove                            the remaining polyp fragment. Resection and                            retrieval were complete. Estimated blood loss was                            minimal.                           Three sessile polyps were found in the transverse                             colon. The polyps were 2 to 3 mm in size. These                            polyps were removed with a cold snare. Resection                            and retrieval were complete. Estimated blood loss                            was minimal.                           The exam was otherwise normal throughout the  examined colon.                           The terminal ileum appeared normal.                           Non-bleeding internal hemorrhoids were found during                            retroflexion. The hemorrhoids were medium-sized.                           No additional abnormalities were found on                            retroflexion. Complications:            No immediate complications. Estimated Blood Loss:     Estimated blood loss was minimal. Impression:               - Perianal skin tags found on perianal exam.                           - One 5 mm polyp in the ascending colon, removed                            with a cold snare and cold forceps. Resected and                            retrieved.                           - Three 2 to 3 mm polyps in the transverse colon,                            removed with a cold snare. Resected and retrieved.                           - The examined portion of the ileum was normal.                           - Non-bleeding internal hemorrhoids. Recommendation:           - Patient has a contact number available for                            emergencies. The signs and symptoms of potential                            delayed complications were discussed with the                            patient. Return to normal activities tomorrow.                            Written discharge instructions were provided to the  patient.                           - Resume previous diet.                           - Continue present medications.                           - Await pathology  results.                           - Repeat colonoscopy (date not yet determined) for                            surveillance based on pathology results.                           - Would consider use of abdominal binder for next                            colonoscopy to reduce looping. Vergene Marland E. Tomasa Rand, MD 01/09/2023 8:47:41 AM This report has been signed electronically.

## 2023-01-09 NOTE — Progress Notes (Signed)
Called to room to assist during endoscopic procedure.  Patient ID and intended procedure confirmed with present staff. Received instructions for my participation in the procedure from the performing physician.  

## 2023-01-09 NOTE — Patient Instructions (Signed)

## 2023-01-09 NOTE — Progress Notes (Signed)
Pt's states no medical or surgical changes since previsit or office visit. 

## 2023-01-10 ENCOUNTER — Telehealth: Payer: Self-pay | Admitting: *Deleted

## 2023-01-10 NOTE — Telephone Encounter (Signed)
Post procedure follow up phone call. No answer at number given.  Left message on voicemail.  

## 2023-01-13 LAB — SURGICAL PATHOLOGY

## 2023-01-16 NOTE — Progress Notes (Signed)
[name],   The four polyps that I removed during your recent procedure were completely benign but were proven to be "pre-cancerous" polyps that MAY have grown into cancers if they had not been removed.  Studies shows that at least 20% of women over age 62 and 30% of men over age 6 have pre-cancerous polyps.  Based on current nationally recognized surveillance guidelines, I recommend that you have a repeat colonoscopy in 4 years.   If you develop any new rectal bleeding, abdominal pain or significant bowel habit changes, please contact me before then.

## 2023-01-20 ENCOUNTER — Other Ambulatory Visit: Payer: Self-pay | Admitting: Family Medicine

## 2023-01-25 ENCOUNTER — Other Ambulatory Visit: Payer: Self-pay | Admitting: Family Medicine

## 2023-01-27 NOTE — Telephone Encounter (Signed)
Patient is requesting a refill of the following medications: Requested Prescriptions   Pending Prescriptions Disp Refills   cetirizine (ZYRTEC) 10 MG tablet [Pharmacy Med Name: Cetirizine Hydrochloride 10mg  24 Hour Allergy Relief Tablet] 90 tablet 2    Sig: Take 1 tablet by mouth daily.   zolpidem (AMBIEN) 10 MG tablet [Pharmacy Med Name: Zolpidem Tartrate 10mg  Tablet] 90 tablet     Sig: Take 1 tablet by mouth at bedtime as needed.    Date of patient request: 01/27/23 Last office visit: 08/22/22 Date of last refill: 08/22/22 Last refill amount: 90 Follow up time period per chart: 6 months

## 2023-01-30 ENCOUNTER — Other Ambulatory Visit (HOSPITAL_COMMUNITY): Payer: Self-pay

## 2023-01-30 ENCOUNTER — Telehealth: Payer: Self-pay

## 2023-01-30 NOTE — Telephone Encounter (Signed)
Pharmacy Patient Advocate Encounter   Received notification from CoverMyMeds that prior authorization for [facility] is required/requested.   Insurance verification completed.   The patient is insured through [facility] .   Per test claim: PA required; PA submitted to [facility] via CoverMyMeds Key/confirmation #/EOC (Key: [id number]) Status is pending

## 2023-01-31 ENCOUNTER — Other Ambulatory Visit (HOSPITAL_COMMUNITY): Payer: Self-pay

## 2023-01-31 NOTE — Telephone Encounter (Signed)
Pharmacy Patient Advocate Encounter  Received notification from [facility] that Prior Authorization for [facility] has been APPROVED from 10.3.24 to 10.3.25. Ran test claim, Copay is $24.99. This test claim was processed through [facility]- copay amounts may vary at other pharmacies due to pharmacy/plan contracts, or as the patient moves through the different stages of their insurance plan.   PA #/Case ID/Reference #: (Key: [id number])

## 2023-02-06 ENCOUNTER — Ambulatory Visit: Payer: BC Managed Care – PPO | Admitting: Family Medicine

## 2023-02-06 ENCOUNTER — Encounter: Payer: Self-pay | Admitting: Family Medicine

## 2023-02-06 VITALS — BP 128/78 | HR 70 | Temp 97.8°F | Wt 208.6 lb

## 2023-02-06 DIAGNOSIS — R6889 Other general symptoms and signs: Secondary | ICD-10-CM | POA: Diagnosis not present

## 2023-02-06 DIAGNOSIS — G4733 Obstructive sleep apnea (adult) (pediatric): Secondary | ICD-10-CM | POA: Diagnosis not present

## 2023-02-06 LAB — POC COVID19 BINAXNOW: SARS Coronavirus 2 Ag: POSITIVE — AB

## 2023-02-06 LAB — POCT INFLUENZA A/B
Influenza A, POC: NEGATIVE
Influenza B, POC: NEGATIVE

## 2023-02-06 MED ORDER — GUAIFENESIN-CODEINE 100-10 MG/5ML PO SYRP
10.0000 mL | ORAL_SOLUTION | Freq: Three times a day (TID) | ORAL | 0 refills | Status: DC | PRN
Start: 2023-02-06 — End: 2023-03-05

## 2023-02-06 MED ORDER — NIRMATRELVIR/RITONAVIR (PAXLOVID) TABLET (RENAL DOSING): 2.0000 | ORAL_TABLET | Freq: Two times a day (BID) | ORAL | 0 refills | Status: AC

## 2023-02-06 NOTE — Progress Notes (Signed)
Subjective:    Patient ID: Allison Reyes, female    DOB: August 14, 1960, 62 y.o.   MRN: [id number]  HPI Sore throat- pt woke Sunday w/ sore throat, loss of voice.  + cough.  + HA.  + body aches.  No fever but chills.  Some sinus pain and pressure along w/ L ear pain.     Review of Systems For ROS see HPI     Objective:   Physical Exam Vitals reviewed.  Constitutional:      General: She is not in acute distress.    Appearance: She is well-developed. She is obese. She is not ill-appearing.  HENT:     Head: Normocephalic and atraumatic.     Right Ear: Tympanic membrane and ear canal normal.     Left Ear: Tympanic membrane and ear canal normal.     Nose: Congestion present. No rhinorrhea.     Mouth/Throat:     Mouth: Mucous membranes are moist. No oral lesions.     Pharynx: No pharyngeal swelling, oropharyngeal exudate or posterior oropharyngeal erythema.     Comments: Copious PND Cardiovascular:     Rate and Rhythm: Normal rate and regular rhythm.  Pulmonary:     Effort: Pulmonary effort is normal. No respiratory distress.     Breath sounds: No wheezing or rhonchi.  Skin:    General: Skin is warm and dry.  Neurological:     General: No focal deficit present.     Mental Status: She is alert and oriented to person, place, and time.  Psychiatric:        Mood and Affect: Mood normal.        Behavior: Behavior normal.           Assessment & Plan:  COVID- new.  Pt's sxs and PE consistent w/ dx and rapid test confirms.  At the 5 day mark so will start Paxlovid.  Will hold statin.  Renal dose Paxlovid due to GFR of 58.  Reviewed supportive care and red flags that should prompt return.  Pt expressed understanding and is in agreement w/ plan.

## 2023-02-06 NOTE — Patient Instructions (Signed)
Follow up as needed or as scheduled START the Paxlovid as directed HOLD the Atorvastatin (lipitor) until you have completed Paxlovid Drink LOTS of fluids USE the cough syrup as needed REST! Alternate tylenol/ibuprofen as needed for headache, body aches, etc Call with any questions or concerns Hang in there!!!

## 2023-02-17 ENCOUNTER — Other Ambulatory Visit: Payer: Self-pay | Admitting: Family Medicine

## 2023-02-17 DIAGNOSIS — F332 Major depressive disorder, recurrent severe without psychotic features: Secondary | ICD-10-CM

## 2023-02-26 ENCOUNTER — Other Ambulatory Visit: Payer: Self-pay | Admitting: Family Medicine

## 2023-02-26 DIAGNOSIS — F332 Major depressive disorder, recurrent severe without psychotic features: Secondary | ICD-10-CM

## 2023-03-01 ENCOUNTER — Other Ambulatory Visit: Payer: Self-pay | Admitting: Family Medicine

## 2023-03-05 ENCOUNTER — Encounter: Payer: Self-pay | Admitting: Family Medicine

## 2023-03-05 ENCOUNTER — Ambulatory Visit: Payer: BC Managed Care – PPO | Admitting: Family Medicine

## 2023-03-05 VITALS — BP 124/72 | HR 75 | Temp 98.1°F | Ht 62.0 in | Wt 207.4 lb

## 2023-03-05 DIAGNOSIS — F332 Major depressive disorder, recurrent severe without psychotic features: Secondary | ICD-10-CM

## 2023-03-05 DIAGNOSIS — Z0000 Encounter for general adult medical examination without abnormal findings: Secondary | ICD-10-CM | POA: Diagnosis not present

## 2023-03-05 DIAGNOSIS — E559 Vitamin D deficiency, unspecified: Secondary | ICD-10-CM | POA: Diagnosis not present

## 2023-03-05 DIAGNOSIS — Z23 Encounter for immunization: Secondary | ICD-10-CM | POA: Diagnosis not present

## 2023-03-05 DIAGNOSIS — M8580 Other specified disorders of bone density and structure, unspecified site: Secondary | ICD-10-CM

## 2023-03-05 DIAGNOSIS — E782 Mixed hyperlipidemia: Secondary | ICD-10-CM | POA: Diagnosis not present

## 2023-03-05 LAB — HEPATIC FUNCTION PANEL
ALT: 32 U/L (ref 0–35)
AST: 22 U/L (ref 0–37)
Albumin: 4.4 g/dL (ref 3.5–5.2)
Alkaline Phosphatase: 103 U/L (ref 39–117)
Bilirubin, Direct: 0.1 mg/dL (ref 0.0–0.3)
Total Bilirubin: 0.8 mg/dL (ref 0.2–1.2)
Total Protein: 7.2 g/dL (ref 6.0–8.3)

## 2023-03-05 LAB — CBC WITH DIFFERENTIAL/PLATELET
Basophils Absolute: 0.1 10*3/uL (ref 0.0–0.1)
Basophils Relative: 0.9 % (ref 0.0–3.0)
Eosinophils Absolute: 0.2 10*3/uL (ref 0.0–0.7)
Eosinophils Relative: 2.6 % (ref 0.0–5.0)
HCT: 44.8 % (ref 36.0–46.0)
Hemoglobin: 14.5 g/dL (ref 12.0–15.0)
Lymphocytes Relative: 31.7 % (ref 12.0–46.0)
Lymphs Abs: 2.1 10*3/uL (ref 0.7–4.0)
MCHC: 32.5 g/dL (ref 30.0–36.0)
MCV: 94.2 fL (ref 78.0–100.0)
Monocytes Absolute: 0.5 10*3/uL (ref 0.1–1.0)
Monocytes Relative: 7.5 % (ref 3.0–12.0)
Neutro Abs: 3.7 10*3/uL (ref 1.4–7.7)
Neutrophils Relative %: 57.3 % (ref 43.0–77.0)
Platelets: 365 10*3/uL (ref 150.0–400.0)
RBC: 4.75 Mil/uL (ref 3.87–5.11)
RDW: 13 % (ref 11.5–15.5)
WBC: 6.5 10*3/uL (ref 4.0–10.5)

## 2023-03-05 LAB — LIPID PANEL
Cholesterol: 152 mg/dL (ref 0–200)
HDL: 55.3 mg/dL (ref >39.00)
LDL Cholesterol: 62 mg/dL (ref 0–99)
NonHDL: 96.9
Total CHOL/HDL Ratio: 3
Triglycerides: 174 mg/dL — ABNORMAL HIGH (ref 0.0–149.0)
VLDL: 34.8 mg/dL (ref 0.0–40.0)

## 2023-03-05 LAB — BASIC METABOLIC PANEL
BUN: 13 mg/dL (ref 6–23)
CO2: 30 meq/L (ref 19–32)
Calcium: 9.8 mg/dL (ref 8.4–10.5)
Chloride: 98 meq/L (ref 96–112)
Creatinine, Ser: 1.11 mg/dL (ref 0.40–1.20)
GFR: 53.27 mL/min — ABNORMAL LOW (ref >60.00)
Glucose, Bld: 77 mg/dL (ref 70–99)
Potassium: 4.4 meq/L (ref 3.5–5.1)
Sodium: 137 meq/L (ref 135–145)

## 2023-03-05 LAB — VITAMIN D 25 HYDROXY (VIT D DEFICIENCY, FRACTURES): VITD: 40.09 ng/mL (ref 30.00–100.00)

## 2023-03-05 LAB — TSH: TSH: 5.49 u[IU]/mL (ref 0.35–5.50)

## 2023-03-05 MED ORDER — CITALOPRAM HYDROBROMIDE 20 MG PO TABS: 20.0000 mg | ORAL_TABLET | Freq: Every day | ORAL | Status: DC

## 2023-03-05 NOTE — Assessment & Plan Note (Signed)
Pt is down 37 lbs since April visit.  Applauded her efforts.  Currently on Wegovy and doing well.  Check labs to risk stratify.  Will follow.

## 2023-03-05 NOTE — Progress Notes (Signed)
   Subjective:    Patient ID: Allison Reyes, female    DOB: 23-Feb-1961, 62 y.o.   MRN: [id number]  HPI CPE- UTD on mammo, cologuard, Tdap.  Due for flu and shingles  Patient Care Team    Relationship Specialty Notifications Start End  Sheliah Hatch, MD PCP - General Family Medicine  08/30/21   Coletta Memos, MD Consulting Physician Neurosurgery  11/25/17   Allie Bossier, MD Consulting Physician Obstetrics and Gynecology  06/02/18      Health Maintenance  Topic Date Due   Zoster Vaccines- Shingrix (1 of 2) Never done   DEXA SCAN  04/13/2021   INFLUENZA VACCINE  11/28/2022   COVID-19 Vaccine (4 - 2023-24 season) 12/29/2022   MAMMOGRAM  12/24/2024   Fecal DNA (Cologuard)  09/23/2025   DTaP/Tdap/Td (3 - Td or Tdap) 08/21/2032   Hepatitis C Screening  Completed   HIV Screening  Completed   HPV VACCINES  Aged Out      Review of Systems Patient reports no vision/ hearing changes, adenopathy,fever, weight change,  persistant/recurrent hoarseness , swallowing issues, chest pain, palpitations, edema, persistant/recurrent cough, hemoptysis, dyspnea (rest/exertional/paroxysmal nocturnal), gastrointestinal bleeding (melena, rectal bleeding), abdominal pain, significant heartburn, bowel changes, GU symptoms (dysuria, hematuria, incontinence), Gyn symptoms (abnormal  bleeding, pain),  syncope, focal weakness, memory loss, numbness & tingling, skin changes, abnormal bruising or bleeding.   + depression- 'it comes and goes'.  Has no interest or motivation to do things.  Recently started talking to a [employer] therapist.  Currently on Citalopram and Wellbutrin. + hair loss + brittle nails    Objective:   Physical Exam General Appearance:    Alert, cooperative, no distress, appears stated age, obese  Head:    Normocephalic, without obvious abnormality, atraumatic  Eyes:    PERRL, conjunctiva/corneas clear, EOM's intact both eyes  Ears:    Normal TM's and external ear canals, both ears   Nose:   Nares normal, septum midline, mucosa normal, no drainage    or sinus tenderness  Throat:   Lips, mucosa, and tongue normal; teeth and gums normal  Neck:   Supple, symmetrical, trachea midline, no adenopathy;    Thyroid: no enlargement/tenderness/nodules  Back:     Symmetric, no curvature, ROM normal, no CVA tenderness  Lungs:     Clear to auscultation bilaterally, respirations unlabored  Chest Wall:    No tenderness or deformity   Heart:    Regular rate and rhythm, S1 and S2 normal, no murmur, rub   or gallop  Breast Exam:    Deferred to mammo  Abdomen:     Soft, non-tender, bowel sounds active all four quadrants,    no masses, no organomegaly  Genitalia:    Deferred  Rectal:    Extremities:   Extremities normal, atraumatic, no cyanosis or edema  Pulses:   2+ and symmetric all extremities  Skin:   Skin color, texture, turgor normal, no rashes or lesions  Lymph nodes:   Cervical, supraclavicular, and axillary nodes normal  Neurologic:   CNII-XII intact, normal strength, sensation and reflexes    throughout          Assessment & Plan:

## 2023-03-05 NOTE — Assessment & Plan Note (Signed)
Ongoing issue.  Pt reports feeling very flat.  She has been on medication for 'years'.  Now that she is on higher dose of Wellbutrin, will decrease Citalopram to see if some emotion returns.  Pt expressed understanding and is in agreement w/ plan.

## 2023-03-05 NOTE — Assessment & Plan Note (Signed)
Check Vit D level and replete prn.  Repeat DEXA

## 2023-03-05 NOTE — Patient Instructions (Addendum)
Follow up in 4-6 weeks to recheck mood We'll notify you of your lab results and make any changes if needed Continue to work on healthy diet and regular exercise- you're doing great! Decrease the Citalopram to 20mg  (1/2 tab) daily Call with any questions or concerns Stay Safe!  Stay Healthy! Hang in there!!!

## 2023-03-05 NOTE — Assessment & Plan Note (Signed)
Chronic problem.  Check labs.  Adjust meds prn  

## 2023-03-05 NOTE — Assessment & Plan Note (Signed)
Pt's PE WNL w/ exception of BMI.  UTD on mammo, cologuard, Tdap.  Flu and shingles shots given today.  Check labs.  Anticipatory guidance provided.

## 2023-03-06 ENCOUNTER — Other Ambulatory Visit: Payer: Self-pay

## 2023-03-06 ENCOUNTER — Telehealth: Payer: Self-pay

## 2023-03-06 DIAGNOSIS — E039 Hypothyroidism, unspecified: Secondary | ICD-10-CM

## 2023-03-06 MED ORDER — LEVOTHYROXINE SODIUM 150 MCG PO TABS: 150.0000 ug | ORAL_TABLET | Freq: Every day | ORAL | 3 refills | Status: DC

## 2023-03-06 NOTE — Telephone Encounter (Signed)
-----   Message from Neena Rhymes sent at 03/06/2023  7:47 AM EST ----- Labs look good!  Your GFR- marker of kidney filtration- is a bit lower but this will improved w/ increased water intake  Your TSH is at the high end of normal- meaning your Levothyroxine dose may be just a bit low.  Based on this, we will increase your Levothyroxine to daily (#30, 3 refills) and repeat your TSH level when you return in 4 weeks  Keep up the good work!

## 2023-04-03 ENCOUNTER — Ambulatory Visit: Payer: BC Managed Care – PPO | Admitting: Family Medicine

## 2023-04-09 ENCOUNTER — Other Ambulatory Visit (HOSPITAL_COMMUNITY): Payer: Self-pay

## 2023-04-10 ENCOUNTER — Ambulatory Visit: Payer: BC Managed Care – PPO | Admitting: Family Medicine

## 2023-04-15 ENCOUNTER — Ambulatory Visit (HOSPITAL_BASED_OUTPATIENT_CLINIC_OR_DEPARTMENT_OTHER)
Admission: RE | Admit: 2023-04-15 | Discharge: 2023-04-15 | Disposition: A | Discharge status: Home / Self Care | Payer: BC Managed Care – PPO | Source: Ambulatory Visit | Attending: Family Medicine | Admitting: Family Medicine

## 2023-04-15 DIAGNOSIS — M8580 Other specified disorders of bone density and structure, unspecified site: Secondary | ICD-10-CM | POA: Insufficient documentation

## 2023-04-15 DIAGNOSIS — M85851 Other specified disorders of bone density and structure, right thigh: Secondary | ICD-10-CM | POA: Diagnosis not present

## 2023-04-26 ENCOUNTER — Other Ambulatory Visit: Payer: Self-pay | Admitting: Family Medicine

## 2023-05-07 ENCOUNTER — Other Ambulatory Visit: Payer: Self-pay | Admitting: Family Medicine

## 2023-05-08 ENCOUNTER — Ambulatory Visit: Payer: BC Managed Care – PPO | Admitting: Family Medicine

## 2023-05-13 ENCOUNTER — Telehealth: Payer: Self-pay | Admitting: Family Medicine

## 2023-05-13 NOTE — Telephone Encounter (Signed)
 Type of form received:GXO   Additional comments: [facility]   Received ab:[id number] -Front Desk   Form should be Faxed/mailed to: N/A  Is patient requesting call for pickup:N/A  Form placed: [employer] charge sheet.  Provider will determine charge. N/A  Individual made aware of 3-5 business day turn around No?

## 2023-05-13 NOTE — Telephone Encounter (Signed)
 Placed in folder at nurse station

## 2023-05-16 NOTE — Telephone Encounter (Signed)
Reviewed. No action needed.

## 2023-05-18 ENCOUNTER — Other Ambulatory Visit: Payer: Self-pay | Admitting: Family Medicine

## 2023-05-18 DIAGNOSIS — F332 Major depressive disorder, recurrent severe without psychotic features: Secondary | ICD-10-CM

## 2023-05-29 ENCOUNTER — Other Ambulatory Visit: Payer: Self-pay | Admitting: Family Medicine

## 2023-05-29 DIAGNOSIS — F332 Major depressive disorder, recurrent severe without psychotic features: Secondary | ICD-10-CM

## 2023-06-17 ENCOUNTER — Other Ambulatory Visit: Payer: Self-pay | Admitting: Family Medicine

## 2023-07-07 ENCOUNTER — Encounter: Payer: Self-pay | Admitting: Family Medicine

## 2023-07-07 ENCOUNTER — Ambulatory Visit (INDEPENDENT_AMBULATORY_CARE_PROVIDER_SITE_OTHER): Payer: BC Managed Care – PPO | Admitting: Family Medicine

## 2023-07-07 VITALS — BP 118/64 | HR 68 | Temp 97.8°F | Ht 62.0 in | Wt 190.5 lb

## 2023-07-07 DIAGNOSIS — E039 Hypothyroidism, unspecified: Secondary | ICD-10-CM | POA: Diagnosis not present

## 2023-07-07 DIAGNOSIS — Z23 Encounter for immunization: Secondary | ICD-10-CM | POA: Diagnosis not present

## 2023-07-07 DIAGNOSIS — R5383 Other fatigue: Secondary | ICD-10-CM

## 2023-07-07 LAB — LIPID PANEL
Cholesterol: 142 mg/dL (ref 0–200)
HDL: 64.7 mg/dL (ref >39.00)
LDL Cholesterol: 58 mg/dL (ref 0–99)
NonHDL: 76.95
Total CHOL/HDL Ratio: 2
Triglycerides: 96 mg/dL (ref 0.0–149.0)
VLDL: 19.2 mg/dL (ref 0.0–40.0)

## 2023-07-07 LAB — BASIC METABOLIC PANEL
BUN: 13 mg/dL (ref 6–23)
CO2: 29 meq/L (ref 19–32)
Calcium: 10.2 mg/dL (ref 8.4–10.5)
Chloride: 101 meq/L (ref 96–112)
Creatinine, Ser: 1.11 mg/dL (ref 0.40–1.20)
GFR: 53.15 mL/min — ABNORMAL LOW (ref >60.00)
Glucose, Bld: 80 mg/dL (ref 70–99)
Potassium: 4.7 meq/L (ref 3.5–5.1)
Sodium: 140 meq/L (ref 135–145)

## 2023-07-07 LAB — HEMOGLOBIN A1C: Hgb A1c MFr Bld: 5.5 % (ref 4.6–6.5)

## 2023-07-07 LAB — CBC WITH DIFFERENTIAL/PLATELET
Basophils Absolute: 0.1 10*3/uL (ref 0.0–0.1)
Basophils Relative: 1.1 % (ref 0.0–3.0)
Eosinophils Absolute: 0.2 10*3/uL (ref 0.0–0.7)
Eosinophils Relative: 2.5 % (ref 0.0–5.0)
HCT: 43.1 % (ref 36.0–46.0)
Hemoglobin: 14.5 g/dL (ref 12.0–15.0)
Lymphocytes Relative: 34.7 % (ref 12.0–46.0)
Lymphs Abs: 2.2 10*3/uL (ref 0.7–4.0)
MCHC: 33.7 g/dL (ref 30.0–36.0)
MCV: 93.2 fl (ref 78.0–100.0)
Monocytes Absolute: 0.4 10*3/uL (ref 0.1–1.0)
Monocytes Relative: 6.2 % (ref 3.0–12.0)
Neutro Abs: 3.5 10*3/uL (ref 1.4–7.7)
Neutrophils Relative %: 55.5 % (ref 43.0–77.0)
Platelets: 330 10*3/uL (ref 150.0–400.0)
RBC: 4.63 Mil/uL (ref 3.87–5.11)
RDW: 13 % (ref 11.5–15.5)
WBC: 6.4 10*3/uL (ref 4.0–10.5)

## 2023-07-07 LAB — VITAMIN D 25 HYDROXY (VIT D DEFICIENCY, FRACTURES): VITD: 28.62 ng/mL — ABNORMAL LOW (ref 30.00–100.00)

## 2023-07-07 LAB — HEPATIC FUNCTION PANEL
ALT: 38 U/L — ABNORMAL HIGH (ref 0–35)
AST: 27 U/L (ref 0–37)
Albumin: 4.6 g/dL (ref 3.5–5.2)
Alkaline Phosphatase: 98 U/L (ref 39–117)
Bilirubin, Direct: 0.2 mg/dL (ref 0.0–0.3)
Total Bilirubin: 1 mg/dL (ref 0.2–1.2)
Total Protein: 7 g/dL (ref 6.0–8.3)

## 2023-07-07 LAB — B12 AND FOLATE PANEL
Folate: 12.5 ng/mL (ref >5.9)
Vitamin B-12: 303 pg/mL (ref 211–911)

## 2023-07-07 LAB — TSH: TSH: 4.11 u[IU]/mL (ref 0.35–5.50)

## 2023-07-07 MED ORDER — LEVOTHYROXINE SODIUM 150 MCG PO TABS: 150.0000 ug | ORAL_TABLET | Freq: Every day | ORAL | 3 refills | Status: DC

## 2023-07-07 MED ORDER — ATORVASTATIN CALCIUM 40 MG PO TABS: 40.0000 mg | ORAL_TABLET | Freq: Every day | ORAL | 0 refills | Status: DC

## 2023-07-07 MED ORDER — ZOLPIDEM TARTRATE 10 MG PO TABS: ORAL_TABLET | ORAL | 1 refills | Status: DC

## 2023-07-07 NOTE — Progress Notes (Unsigned)
   Subjective:    Patient ID: Allison Reyes, female    DOB: [birth year], 63 y.o.   MRN: [id number]  HPI Fatigue- 'this tiredness is just ridiculous'.  Pt reports sleeping 'pretty well' at night.  No snoring or breathing pauses at night.  Continues to have some struggles w/ depression but denies worsening sxs.  Although she has not seen or spoken to daughter and granddaughter in 3 months- which is weighing heavily on her.  Has some days that she is able to focus and get quite a bit done and then other days 'I'm in outer space'.    Obesity- she is down 17 lbs since Nov.  Currently on Wegovy 2.4mg /week.  Pt is down 72 lbs over the last year!  Is having some abd pain and diarrhea that just started 3-4 weeks ago.  Seems to be improving so may be viral.   Review of Systems For ROS see HPI     Objective:   Physical Exam Vitals reviewed.  Constitutional:      General: She is not in acute distress.    Appearance: Normal appearance. She is well-developed. She is obese. She is not ill-appearing.  HENT:     Head: Normocephalic and atraumatic.  Eyes:     Conjunctiva/sclera: Conjunctivae normal.     Pupils: Pupils are equal, round, and reactive to light.  Neck:     Thyroid: No thyromegaly.  Cardiovascular:     Rate and Rhythm: Normal rate and regular rhythm.     Pulses: Normal pulses.     Heart sounds: Normal heart sounds. No murmur heard. Pulmonary:     Effort: Pulmonary effort is normal. No respiratory distress.     Breath sounds: Normal breath sounds.  Abdominal:     General: There is no distension.     Palpations: Abdomen is soft.     Tenderness: There is no abdominal tenderness.  Musculoskeletal:     Cervical back: Normal range of motion and neck supple.     Right lower leg: No edema.     Left lower leg: No edema.  Lymphadenopathy:     Cervical: No cervical adenopathy.  Skin:    General: Skin is warm and dry.  Neurological:     Mental Status: She is alert and oriented to person,  place, and time.  Psychiatric:        Behavior: Behavior normal.           Assessment & Plan:  Fatigue- new.  Reviewed possible metabolic/physical causes of fatigue.  Also discussed possible emotional fatigue.  Pt is having a very stressful situation w/ her oldest daughter- they have not spoken in 3 months and she has not seen her grandchild.  Will check labs to r/o physical or metabolic causes.  If labs normal, plan is to increase Citalopram to 40mg  daily.  Pt expressed understanding and is in agreement w/ plan.

## 2023-07-07 NOTE — Patient Instructions (Signed)
 Follow up in 2 months to recheck mood We'll notify you of your lab results and make any changes if needed Continue to work on healthy diet and regular exercise- you're doing great! If labs look good, we'll increase the Citalopram to 40mg  daily Call with any questions or concerns Stay Safe!  Stay Healthy! Hang in there!!

## 2023-07-08 ENCOUNTER — Other Ambulatory Visit: Payer: Self-pay

## 2023-07-08 ENCOUNTER — Encounter: Payer: Self-pay | Admitting: Family Medicine

## 2023-07-08 DIAGNOSIS — E559 Vitamin D deficiency, unspecified: Secondary | ICD-10-CM

## 2023-07-08 MED ORDER — VITAMIN D (ERGOCALCIFEROL) 1.25 MG (50000 UNIT) PO CAPS: 50000.0000 [IU] | ORAL_CAPSULE | ORAL | 0 refills | Status: DC

## 2023-07-08 NOTE — Assessment & Plan Note (Signed)
 Improving!  She is down another 17 lbs since November and BMI is now <35.  She is currently on Wegovy 2.4mg /week and doing well.  She has not had any issues w/ GI side effects until the last few weeks when she developed abd pain and diarrhea.  Thankfully this is improving so she's thinking it may have been viral.  Applauded her efforts.  Check labs to risk stratify.  Will follow.

## 2023-07-24 ENCOUNTER — Other Ambulatory Visit: Payer: Self-pay | Admitting: Family Medicine

## 2023-08-01 ENCOUNTER — Other Ambulatory Visit: Payer: Self-pay | Admitting: Family Medicine

## 2023-08-01 NOTE — Telephone Encounter (Signed)
 Requested Prescriptions   Pending Prescriptions Disp Refills   tiZANidine (ZANAFLEX) 4 MG tablet [Pharmacy Med Name: Tizanidine Hydrochloride 4mg  Tablet] 90 tablet 0    Sig: Take 1 tablet by mouth every 6 hours as needed for muscle spasms.     Date of patient request: 08/01/23 Last office visit: 07/07/2023 Upcoming visit: 09/10/2023 Date of last refill: 08/22/2022 Last refill amount: 90

## 2023-08-02 ENCOUNTER — Other Ambulatory Visit: Payer: Self-pay | Admitting: Family Medicine

## 2023-08-05 ENCOUNTER — Other Ambulatory Visit: Payer: Self-pay | Admitting: Family Medicine

## 2023-08-07 ENCOUNTER — Other Ambulatory Visit: Payer: Self-pay | Admitting: Family Medicine

## 2023-08-08 NOTE — Telephone Encounter (Signed)
 Requested Prescriptions   Pending Prescriptions Disp Refills   zolpidem (AMBIEN) 10 MG tablet [Pharmacy Med Name: Zolpidem Tartrate 10mg  Tablet] 90 tablet 0    Sig: Take 1 tablet by mouth at bedtime as needed.     Date of patient request: 08/08/2023 Last office visit: 07/07/2023 Upcoming visit: 09/10/2023 Date of last refill: 07/07/2023 Last refill amount: 90

## 2023-08-20 ENCOUNTER — Other Ambulatory Visit: Payer: Self-pay | Admitting: Family Medicine

## 2023-08-21 ENCOUNTER — Other Ambulatory Visit: Payer: Self-pay | Admitting: Family Medicine

## 2023-08-21 DIAGNOSIS — F332 Major depressive disorder, recurrent severe without psychotic features: Secondary | ICD-10-CM

## 2023-09-03 ENCOUNTER — Encounter (HOSPITAL_COMMUNITY): Payer: Self-pay

## 2023-09-04 ENCOUNTER — Other Ambulatory Visit: Payer: Self-pay | Admitting: Medical Genetics

## 2023-09-05 ENCOUNTER — Other Ambulatory Visit: Payer: Self-pay

## 2023-09-10 ENCOUNTER — Encounter: Payer: Self-pay | Admitting: Family Medicine

## 2023-09-10 ENCOUNTER — Ambulatory Visit: Admitting: Family Medicine

## 2023-09-10 ENCOUNTER — Other Ambulatory Visit (HOSPITAL_COMMUNITY): Payer: Self-pay

## 2023-09-10 VITALS — BP 110/68 | HR 66 | Temp 98.1°F | Ht 62.0 in | Wt 193.2 lb

## 2023-09-10 DIAGNOSIS — E559 Vitamin D deficiency, unspecified: Secondary | ICD-10-CM | POA: Diagnosis not present

## 2023-09-10 DIAGNOSIS — F332 Major depressive disorder, recurrent severe without psychotic features: Secondary | ICD-10-CM | POA: Diagnosis not present

## 2023-09-10 DIAGNOSIS — R5383 Other fatigue: Secondary | ICD-10-CM

## 2023-09-10 LAB — T4, FREE: Free T4: 1.1 ng/dL (ref 0.60–1.60)

## 2023-09-10 LAB — VITAMIN B12: Vitamin B-12: 1432 pg/mL — ABNORMAL HIGH (ref 211–911)

## 2023-09-10 LAB — T3, FREE: T3, Free: 3.6 pg/mL (ref 2.3–4.2)

## 2023-09-10 LAB — TSH: TSH: 0.23 u[IU]/mL — ABNORMAL LOW (ref 0.35–5.50)

## 2023-09-10 MED ORDER — CITALOPRAM HYDROBROMIDE 40 MG PO TABS: 40.0000 mg | ORAL_TABLET | Freq: Every day | ORAL | 1 refills | Status: DC

## 2023-09-10 NOTE — Patient Instructions (Signed)
 Follow up in 6-8 weeks to recheck mood We'll notify you of your lab results and make any changes if needed Keep up the good work!  You look great!! INCREASE the Citalopram  to 40mg  daily- 2 of what you have at home and 1 of the new prescription Call with any questions or concerns Stay Safe!  Stay Healthy! Hang in there!!!

## 2023-09-10 NOTE — Progress Notes (Signed)
   Subjective:    Patient ID: Allison Reyes, female    DOB: 03/30/1961, 63 y.o.   MRN: [id number]  HPI Depression- ongoing issue.  Currently on Wellbutrin  450mg  daily and Citalopram  20mg  daily.  On Ambien  PRN.  Pt reports fatigue is draining for her.  Has been trying to go out more frequently.  Feels that concentration and memory are worsening- has to do a lot of multitasking at work.  Vit Deficiency- pt w/ low Vit D and borderline low B12.  No improvement in fatigue.  'by the end of the day, I'm extremely tired'.     Review of Systems For ROS see HPI     Objective:   Physical Exam Vitals reviewed.  Constitutional:      General: She is not in acute distress.    Appearance: Normal appearance. She is not ill-appearing.  HENT:     Head: Normocephalic and atraumatic.  Eyes:     Extraocular Movements: Extraocular movements intact.     Conjunctiva/sclera: Conjunctivae normal.  Cardiovascular:     Rate and Rhythm: Normal rate and regular rhythm.  Pulmonary:     Effort: Pulmonary effort is normal. No respiratory distress.  Musculoskeletal:     Cervical back: Neck supple.  Lymphadenopathy:     Cervical: No cervical adenopathy.  Skin:    General: Skin is warm and dry.     Findings: No rash.  Neurological:     General: No focal deficit present.     Mental Status: She is alert and oriented to person, place, and time.  Psychiatric:        Mood and Affect: Mood normal.        Behavior: Behavior normal.        Thought Content: Thought content normal.           Assessment & Plan:   Fatigue- continues to struggle.  Was found to have low Vit D and borderline B12 at previous visits but doesn't feel that energy has improved w/ increased supplementation.  She has hx of hypothyroid- will repeat labs today to assess.  Will try and address mood sxs in hopes of improving energy level.  Will continue to follow closely.

## 2023-09-11 ENCOUNTER — Ambulatory Visit: Payer: Self-pay | Admitting: Family Medicine

## 2023-09-11 NOTE — Telephone Encounter (Signed)
 Pt has reviewed labs via MyChart

## 2023-09-11 NOTE — Telephone Encounter (Signed)
-----   Message from Laymon Priest sent at 09/11/2023  7:39 AM EDT ----- B12 looks good!  No worries about it being elevated, you will just pee out any extra and it will not cause harm  TSH is just slightly low (meaning your levothyroxine  dose could be a bit high) but your free T3 and free T4 are both in normal range.  This means no med changes at needed at this time

## 2023-09-19 ENCOUNTER — Encounter: Payer: Self-pay | Admitting: Family Medicine

## 2023-09-19 NOTE — Telephone Encounter (Signed)
 Patient was advised to increase to 40 mg upon further review she was already taking 40 mg and is asking if she should get an Rx for 20 mg to make 60 mg?   Please advise

## 2023-09-21 ENCOUNTER — Other Ambulatory Visit: Payer: Self-pay | Admitting: Family Medicine

## 2023-09-21 DIAGNOSIS — E559 Vitamin D deficiency, unspecified: Secondary | ICD-10-CM

## 2023-09-23 ENCOUNTER — Ambulatory Visit: Payer: Self-pay

## 2023-09-23 NOTE — Telephone Encounter (Signed)
 Pt has appt with you tomorrow.

## 2023-09-23 NOTE — Telephone Encounter (Signed)
 Chief Complaint: Cough Symptoms: Dizziness w/coughing, a little chest tightness, possible wheezing Frequency: Onset 3 days Pertinent Negatives: Patient denies other symptoms Disposition: [] ED /[] Urgent Care (no appt availability in office) / [x] Appointment(In office/virtual)/ []  [facility]/ [] Home Care/ [] Refused Recommended Disposition /[] Allison Reyes/ []  Follow-up with PCP Additional Notes: Patient says cough ongoing x 3 days and when she coughs she gets dizzy and SOB before the cough, but it all clears up with resting after the cough, no other symptoms. Advised OV, she asked for a virtual visit saying the drive is too long and she doesn't feel up to making that drive. Advised no availability with PCP, schedule with Dr. Gayl Katos tomorrow.   Copied from CRM [id number]. Topic: Clinical - Red Word Triage >> Sep 23, 2023 10:22 AM Allison Arena wrote: [facility] that prompted transfer to Nurse Triage: Worsening cough. Patient stated she has had a cough for 3 days, stated it is a deep cough. Coughing up some phlegm, and gets dizzy when coughing. Reason for Disposition  SEVERE coughing spells (e.g., whooping sound after coughing, vomiting after coughing)  Answer Assessment - Initial Assessment Questions 1. ONSET: "When did the cough begin?"      Friday 2. SEVERITY: "How bad is the cough today?"      8-9 3. SPUTUM: "Describe the color of your sputum" (none, dry cough; clear, white, yellow, green)     Clear  4. HEMOPTYSIS: "Are you coughing up any blood?" If so ask: "How much?" (flecks, streaks, tablespoons, etc.)     No 5. DIFFICULTY BREATHING: "Are you having difficulty breathing?" If Yes, ask: "How bad is it?" (e.g., mild, moderate, severe)    - MILD: No SOB at rest, mild SOB with walking, speaks normally in sentences, can lie down, no retractions, pulse < 100.    - MODERATE: SOB at rest, SOB with minimal exertion and prefers to sit, cannot lie down flat, speaks in phrases, mild  retractions, audible wheezing, pulse 100-120.    - SEVERE: Very SOB at rest, speaks in single words, struggling to breathe, sitting hunched forward, retractions, pulse > 120      Right before cough, then it gets back to normal 6. FEVER: "Do you have a fever?" If Yes, ask: "What is your temperature, how was it measured, and when did it start?"     No 7. CARDIAC HISTORY: "Do you have any history of heart disease?" (e.g., heart attack, congestive heart failure)      No 8. LUNG HISTORY: "Do you have any history of lung disease?"  (e.g., pulmonary embolus, asthma, emphysema)     No  10. OTHER SYMPTOMS: "Do you have any other symptoms?" (e.g., runny nose, wheezing, chest pain)       Dizzy when coughing, runny nose, a little chest tightness not consistent, pain in ears, wheezing heard when lying down and ears covered   11. TRAVEL: "Have you traveled out of the country in the last month?" (e.g., travel history, exposures)       No  Protocols used: Cough - Acute Productive-A-AH

## 2023-09-23 NOTE — Assessment & Plan Note (Signed)
 Was found to have low Vit D in March.  Started treating w/ 50,000 units weekly but she doesn't feel that this has improved her fatigue.  Will continue to follow.

## 2023-09-23 NOTE — Assessment & Plan Note (Signed)
 Ongoing issue.  She is trying to force herself to go out and do things more regularly which she feels is a good thing.  She isn't sure whether her mood is causing fatigue or if her fatigue is causing mood sxs.  At last visit we increased Wellbutrin  to 450mg  daily.  Since she is currently on Citalopram  20mg  daily will increase to 40mg  daily.  Pt expressed understanding and is in agreement w/ plan.

## 2023-09-24 ENCOUNTER — Telehealth (INDEPENDENT_AMBULATORY_CARE_PROVIDER_SITE_OTHER): Admitting: Student in an Organized Health Care Education/Training Program

## 2023-09-24 VITALS — Wt 192.0 lb

## 2023-09-24 DIAGNOSIS — J209 Acute bronchitis, unspecified: Secondary | ICD-10-CM | POA: Diagnosis not present

## 2023-09-24 MED ORDER — BENZONATATE 100 MG PO CAPS: 100.0000 mg | ORAL_CAPSULE | Freq: Three times a day (TID) | ORAL | 1 refills | Status: DC | PRN

## 2023-09-24 MED ORDER — PREDNISONE 10 MG PO TABS: ORAL_TABLET | ORAL | 0 refills | Status: AC

## 2023-09-24 NOTE — Progress Notes (Signed)
 Acute Video Visit  I connected with  [name] on 09/24/23 by a video enabled telemedicine application and verified that I am speaking with the correct person using two identifiers.   I discussed the limitations of evaluation and management by telemedicine. The patient expressed understanding and agreed to proceed.   Subjective:     Patient ID: Allison Reyes, female    DOB: January 16, 1961, 63 y.o.   MRN: [id number]  Chief Complaint  Patient presents with   Cough    Cough that has been worsening since Friday, no fevers but dose ended up getting dizzy after coughing. Taking albuterol  to help with breathing     HPI  63 year old person here for 5 days of progressive, worsening nonproductive cough.  Her husband was recently ill with a similar upper respiratory tract infection.  She started to feel poorly on Friday.  She has developed a cough that bothers her throughout the day, she is losing her voice.  Coughing fits give her dizziness, shortness of breath, and some chest discomfort.  She has had no nausea or vomiting.  She describes the cough as a barking type cough.  Brings up very little sputum.  Denies any fevers, has some subjective chills.  Sleeping pretty well.  Eating and drinking well.  Getting around the house okay.  Still working virtually.  No history of COPD or structural lung disease, but has had issues in the past with prolonged postinfectious coughs lasting 4 weeks.  Had a consultation with pulmonology in late 2023.  Has had courses of steroids for these postinfectious coughs which have been helpful.  Currently no pain or pressure in her sinuses, only mild nasal drainage, no pain in her ears.  Only mild sore throat.      Objective:    Wt 192 lb (87.1 kg) Comment: patient has taken at home  BMI 35.12 kg/m   Physical Exam  Gen: Tired appearing Lungs: Breathing comfortably, frequent coughs with speaking Neuro: Alert, conversational, full strength      Assessment &  Plan:   Problem List Items Addressed This Visit       Unprioritized   Acute bronchitis - Primary   History most consistent with acute bronchitis.  Low risk for pneumonia, no signs of sinusitis or otitis media.  No role for antibiotics at this point.  Daytime cough is most bothersome to her.  Seems to have some significant reactive airway disease.  Has a history of chronic cough, previously has been treated with numerous courses of prednisone , Symbicort , Singulair .  Consulted with Dr. Marygrace Snellen and pulmonology in 2023.  But no formal diagnosis of persistent asthma, and low risk for COPD.  We talked about supportive care of acute bronchitis.  Given the likelihood of reactive airway disease, and how she has responded well to prednisone  in the past, we decided to do a taper of prednisone .  We talked about the risks of that medication and the expected side effects.  We talked about supportive care with Tessalon , safe use of NSAIDs, and throat lozenges.      Relevant Medications   predniSONE  (DELTASONE ) 10 MG tablet   benzonatate  (TESSALON  PERLES) 100 MG capsule    Meds ordered this encounter  Medications   predniSONE  (DELTASONE ) 10 MG tablet    Sig: Take 4 tablets (40 mg total) by mouth daily with breakfast for 3 days, THEN 3 tablets (30 mg total) daily with breakfast for 3 days, THEN 2 tablets (20 mg total) daily with breakfast  for 3 days, THEN 1 tablet (10 mg total) daily with breakfast for 3 days.    Dispense:  30 tablet    Refill:  0   benzonatate  (TESSALON  PERLES) 100 MG capsule    Sig: Take 1 capsule (100 mg total) by mouth 3 (three) times daily as needed for cough.    Dispense:  30 capsule    Refill:  1    Ether Hercules, MD

## 2023-09-24 NOTE — Assessment & Plan Note (Signed)
 History most consistent with acute bronchitis.  Low risk for pneumonia, no signs of sinusitis or otitis media.  No role for antibiotics at this point.  Daytime cough is most bothersome to her.  Seems to have some significant reactive airway disease.  Has a history of chronic cough, previously has been treated with numerous courses of prednisone , Symbicort , Singulair .  Consulted with Dr. Marygrace Snellen and pulmonology in 2023.  But no formal diagnosis of persistent asthma, and low risk for COPD.  We talked about supportive care of acute bronchitis.  Given the likelihood of reactive airway disease, and how she has responded well to prednisone  in the past, we decided to do a taper of prednisone .  We talked about the risks of that medication and the expected side effects.  We talked about supportive care with Tessalon , safe use of NSAIDs, and throat lozenges.

## 2023-09-27 ENCOUNTER — Other Ambulatory Visit: Payer: Self-pay | Admitting: Family Medicine

## 2023-10-02 ENCOUNTER — Ambulatory Visit: Admitting: Student in an Organized Health Care Education/Training Program

## 2023-10-03 ENCOUNTER — Encounter: Payer: Self-pay | Admitting: Student in an Organized Health Care Education/Training Program

## 2023-10-03 ENCOUNTER — Ambulatory Visit (HOSPITAL_BASED_OUTPATIENT_CLINIC_OR_DEPARTMENT_OTHER)
Admission: RE | Admit: 2023-10-03 | Discharge: 2023-10-03 | Disposition: A | Discharge status: Home / Self Care | Source: Ambulatory Visit | Attending: Student in an Organized Health Care Education/Training Program | Admitting: Student in an Organized Health Care Education/Training Program

## 2023-10-03 ENCOUNTER — Ambulatory Visit (INDEPENDENT_AMBULATORY_CARE_PROVIDER_SITE_OTHER): Admitting: Student in an Organized Health Care Education/Training Program

## 2023-10-03 VITALS — BP 117/63 | HR 76 | Temp 98.2°F | Wt 194.0 lb

## 2023-10-03 DIAGNOSIS — H6690 Otitis media, unspecified, unspecified ear: Secondary | ICD-10-CM

## 2023-10-03 DIAGNOSIS — J209 Acute bronchitis, unspecified: Secondary | ICD-10-CM

## 2023-10-03 DIAGNOSIS — R058 Other specified cough: Secondary | ICD-10-CM | POA: Diagnosis not present

## 2023-10-03 MED ORDER — AMOXICILLIN-POT CLAVULANATE 875-125 MG PO TABS: 1.0000 | ORAL_TABLET | Freq: Two times a day (BID) | ORAL | 0 refills | Status: AC

## 2023-10-03 NOTE — Assessment & Plan Note (Signed)
 Acute issue.  Complication of a ongoing upper respiratory tract infection.  Both eardrums are bulging, right one is erythematous with a small effusion.  Will treat with Augmentin  for 7 days.

## 2023-10-03 NOTE — Assessment & Plan Note (Signed)
 Acute issue, with 3 weeks of cough that has become more productive.  Not improved with prednisone .  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  Exam today does have crackles on the right base, and with increased productive cough there is risk of pneumonia.  Will get chest x-ray to eval for pneumonia.  We talked about supportive care.  I do not think more prednisone  would be helpful at this point.  No wheezing on exam, so I do not want to use inhaled steroids at this point either.  I am going to prescribe antibiotics because she has acute otitis media as a complication of this upper respiratory tract infection.

## 2023-10-03 NOTE — Progress Notes (Signed)
   Acute Office Visit  Subjective:     Patient ID: Allison Reyes, female    DOB: June 17, 1960, 63 y.o.   MRN: [id number]  Chief Complaint  Patient presents with   Cough    Cough,yellow and cloudy mucus these symtpoms have seem to return after taking steroids from 09/24/2023 telemed visits.      HPI  63 year old person here for persistent cough.  I saw the patient in a virtual visit last week for acute bronchitis.  She was having acute nonproductive cough.  She had a history of postinfectious reactive airway disease.  We treated her supportively and use prednisone  for the likely reactive airway component.  She finished the prednisone  taper, reports not much benefit over the last week.  No fevers or chills.  Systemic symptoms have resolved.  However the cough has been persistent.  It is now more productive.  She is having some posttussive lightheadedness, but no syncope.  Denies shortness of breath with exertion.  Doing okay at home, eating and drinking well.  She has not been drinking enough fluids because of cough induced incontinence.      Objective:    BP 117/63   Pulse 76   Temp 98.2 F (36.8 C) (Oral)   Wt 194 lb (88 kg)   SpO2 99%   BMI 35.48 kg/m    Physical Exam  Gen: Well-appearing woman, coughing frequently Ears: Right tympanic membrane is swollen, diminished light reflex, moderately erythematous, small posterior effusion, left tympanic membrane is mildly bulging, gray not as erythematous, no effusion Mouth: Posterior oropharynx appears normal, no erythema Lungs: Unlabored, mild crackles heard at the right base, clear throughout otherwise, no wheezing      Assessment & Plan:   Problem List Items Addressed This Visit       Unprioritized   Otitis media   Acute issue.  Complication of a ongoing upper respiratory tract infection.  Both eardrums are bulging, right one is erythematous with a small effusion.  Will treat with Augmentin  for 7 days.      Relevant  Medications   amoxicillin -clavulanate (AUGMENTIN ) 875-125 MG tablet   Acute bronchitis - Primary   Acute issue, with 3 weeks of cough that has become more productive.  Not improved with prednisone .  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  Exam today does have crackles on the right base, and with increased productive cough there is risk of pneumonia.  Will get chest x-ray to eval for pneumonia.  We talked about supportive care.  I do not think more prednisone  would be helpful at this point.  No wheezing on exam, so I do not want to use inhaled steroids at this point either.  I am going to prescribe antibiotics because she has acute otitis media as a complication of this upper respiratory tract infection.      Relevant Orders   DG Chest 2 View    Meds ordered this encounter  Medications   amoxicillin -clavulanate (AUGMENTIN ) 875-125 MG tablet    Sig: Take 1 tablet by mouth 2 (two) times daily for 7 days.    Dispense:  14 tablet    Refill:  0    Ether Hercules, MD

## 2023-10-06 ENCOUNTER — Ambulatory Visit: Payer: Self-pay | Admitting: Student in an Organized Health Care Education/Training Program

## 2023-10-14 ENCOUNTER — Other Ambulatory Visit: Payer: Self-pay | Admitting: Family Medicine

## 2023-10-15 ENCOUNTER — Other Ambulatory Visit: Payer: Self-pay | Admitting: Family Medicine

## 2023-10-30 ENCOUNTER — Telehealth: Payer: Self-pay | Admitting: Family Medicine

## 2023-10-30 NOTE — Telephone Encounter (Signed)
 error

## 2023-11-06 ENCOUNTER — Ambulatory Visit: Admitting: Family Medicine

## 2023-11-12 ENCOUNTER — Other Ambulatory Visit (HOSPITAL_COMMUNITY): Payer: Self-pay

## 2023-11-12 ENCOUNTER — Telehealth: Payer: Self-pay

## 2023-11-12 NOTE — Telephone Encounter (Signed)
 Pharmacy Patient Advocate Encounter   Received notification from CoverMyMeds that prior authorization for Wegovy  2.4 is required/requested.   Insurance verification completed.   The patient is insured through [facility] .   Per test claim: PA required; PA submitted to above mentioned insurance via CoverMyMeds Key/confirmation #/EOC [facility] Status is pending

## 2023-11-13 ENCOUNTER — Other Ambulatory Visit (HOSPITAL_COMMUNITY): Payer: Self-pay

## 2023-11-13 ENCOUNTER — Other Ambulatory Visit: Payer: Self-pay

## 2023-11-13 DIAGNOSIS — E039 Hypothyroidism, unspecified: Secondary | ICD-10-CM

## 2023-11-13 MED ORDER — LEVOTHYROXINE SODIUM 150 MCG PO TABS: 150.0000 ug | ORAL_TABLET | Freq: Every day | ORAL | 3 refills | Status: DC

## 2023-11-13 NOTE — Telephone Encounter (Signed)
 Additional information has been requested from the patient's insurance in order to proceed with the prior authorization request. Requested information has been sent, or form has been filled out and faxed back to see patient media   Spoke with Apolinar to complete the additional info questions. Case [id number]. Has been pushed for clinical review.

## 2023-11-14 ENCOUNTER — Other Ambulatory Visit (HOSPITAL_COMMUNITY): Payer: Self-pay

## 2023-11-14 NOTE — Telephone Encounter (Signed)
 Pharmacy Patient Advocate Encounter  Received notification from [facility] that Prior Authorization for Wegovy  2.4 has been APPROVED from 11/13/23 to 05/10/24. Ran test claim, Copay is [phone] due to patient deductible. This test claim was processed through [facility]- copay amounts may vary at other pharmacies due to pharmacy/plan contracts, or as the patient moves through the different stages of their insurance plan.   PA #/Case ID/Reference #: [id number]

## 2023-11-14 NOTE — Telephone Encounter (Signed)
 Called patient to give her an update on medication. Left vm for patient to return call.

## 2023-11-17 NOTE — Telephone Encounter (Signed)
 Second attempt in trying to reach patient about the cost of medication. Left vm to return call.

## 2023-11-18 NOTE — Telephone Encounter (Signed)
 Patient called back and spoke to [facility] about this message and was going to consider options

## 2023-12-12 ENCOUNTER — Other Ambulatory Visit: Payer: Self-pay | Admitting: Family Medicine

## 2023-12-15 ENCOUNTER — Ambulatory Visit (INDEPENDENT_AMBULATORY_CARE_PROVIDER_SITE_OTHER): Admitting: Family Medicine

## 2023-12-15 ENCOUNTER — Encounter: Payer: Self-pay | Admitting: Family Medicine

## 2023-12-15 DIAGNOSIS — F332 Major depressive disorder, recurrent severe without psychotic features: Secondary | ICD-10-CM

## 2023-12-15 DIAGNOSIS — E559 Vitamin D deficiency, unspecified: Secondary | ICD-10-CM

## 2023-12-15 MED ORDER — ZOLPIDEM TARTRATE 10 MG PO TABS: 10.0000 mg | ORAL_TABLET | Freq: Every evening | ORAL | 0 refills | Status: AC | PRN

## 2023-12-15 MED ORDER — ATORVASTATIN CALCIUM 40 MG PO TABS: 40.0000 mg | ORAL_TABLET | Freq: Every day | ORAL | 1 refills | Status: AC

## 2023-12-15 MED ORDER — BUPROPION HCL ER (XL) 150 MG PO TB24: 150.0000 mg | ORAL_TABLET | Freq: Every day | ORAL | 0 refills | Status: DC

## 2023-12-15 MED ORDER — BUPROPION HCL ER (XL) 300 MG PO TB24: 300.0000 mg | ORAL_TABLET | Freq: Every day | ORAL | 1 refills | Status: DC

## 2023-12-15 MED ORDER — CALCITRIOL 0.5 MCG PO CAPS
1.5000 ug | ORAL_CAPSULE | Freq: Every day | ORAL | 0 refills | Status: DC
Start: 2023-12-15 — End: 2023-12-30

## 2023-12-15 MED ORDER — METHYLPHENIDATE HCL ER (OSM) 18 MG PO TBCR: 18.0000 mg | EXTENDED_RELEASE_TABLET | Freq: Every day | ORAL | 0 refills | Status: DC

## 2023-12-15 MED ORDER — ROPINIROLE HCL 1 MG PO TABS: 1.0000 mg | ORAL_TABLET | Freq: Every day | ORAL | 3 refills | Status: AC

## 2023-12-15 MED ORDER — VITAMIN D (ERGOCALCIFEROL) 1.25 MG (50000 UNIT) PO CAPS
50000.0000 [IU] | ORAL_CAPSULE | ORAL | 0 refills | Status: DC
Start: 2023-12-15 — End: 2024-03-16

## 2023-12-15 MED ORDER — CETIRIZINE HCL 10 MG PO TABS: 10.0000 mg | ORAL_TABLET | Freq: Every day | ORAL | 1 refills | Status: DC

## 2023-12-15 MED ORDER — WEGOVY 2.4 MG/0.75ML ~~LOC~~ SOAJ
2.4000 mg | SUBCUTANEOUS | 3 refills | Status: DC
Start: 2023-12-15 — End: 2024-03-15

## 2023-12-15 NOTE — Assessment & Plan Note (Signed)
 Deteriorated.  Pt reports worsening depression, severe fatigue, brain fog, distractibility, and inability to focus.  At this point, she feels it's the brain fog, memory issues, and distractability that are worsening her mood rather.  She would like to try a low dose ADHD medication and see if sxs improve prior to adding an adjunctive antipsychotic.  Will start low dose Concerta  and monitor for improvement.  Pt expressed understanding and is in agreement w/ plan.

## 2023-12-15 NOTE — Patient Instructions (Signed)
 Follow up in 3-4 weeks to recheck mood, focus START the Concerta  once daily CONTINUE the Wellbutrin  and Citalopram  daily USE the Zolpidem  (Ambien ) to sleep Call with any questions or concerns Stay Safe! Hang in there!!!

## 2023-12-15 NOTE — Progress Notes (Signed)
   Subjective:    Patient ID: Allison Reyes, female    DOB: [birth year], 63 y.o.   MRN: [id number]  HPI Depression- ongoing issue.  Currently on Wellbutrin  450mg  daily.  At last visit we increased Citalopram  to 40mg  daily.  'my depression is worse'.  Got laid off in June.  Reports having issues w/ brain fog, memory, and word finding- is very frustrated.  Feels she is very scattered, unable to focus.  Children have ADHD and she reports this is how she is feeling.  Has always had ADHD tendencies but never officially dx'd.  Feels that right now it's her brain fog, inability to focus, and difficulty w/ task completion that is worsening her depression.   Review of Systems For ROS see HPI     Objective:   Physical Exam Vitals reviewed.  Constitutional:      General: She is not in acute distress.    Appearance: Normal appearance. She is obese. She is not ill-appearing.  HENT:     Head: Normocephalic and atraumatic.  Eyes:     Extraocular Movements: Extraocular movements intact.     Conjunctiva/sclera: Conjunctivae normal.  Cardiovascular:     Rate and Rhythm: Normal rate and regular rhythm.  Skin:    General: Skin is warm and dry.  Neurological:     General: No focal deficit present.     Mental Status: She is alert and oriented to person, place, and time.  Psychiatric:        Mood and Affect: Mood normal.        Behavior: Behavior normal.        Thought Content: Thought content normal.           Assessment & Plan:

## 2023-12-26 ENCOUNTER — Other Ambulatory Visit: Payer: Self-pay | Admitting: Family Medicine

## 2023-12-26 DIAGNOSIS — F332 Major depressive disorder, recurrent severe without psychotic features: Secondary | ICD-10-CM

## 2024-01-12 ENCOUNTER — Encounter: Payer: Self-pay | Admitting: Family Medicine

## 2024-01-12 ENCOUNTER — Ambulatory Visit (INDEPENDENT_AMBULATORY_CARE_PROVIDER_SITE_OTHER): Admitting: Family Medicine

## 2024-01-12 VITALS — BP 108/68 | HR 71 | Temp 97.8°F | Ht 62.0 in | Wt 203.4 lb

## 2024-01-12 DIAGNOSIS — F332 Major depressive disorder, recurrent severe without psychotic features: Secondary | ICD-10-CM

## 2024-01-12 MED ORDER — METHYLPHENIDATE HCL ER 27 MG PO TB24: 27.0000 mg | ORAL_TABLET | Freq: Every day | ORAL | 0 refills | Status: DC

## 2024-01-12 NOTE — Progress Notes (Signed)
   Subjective:    Patient ID: Allison Reyes, female    DOB: [birth year], 63 y.o.   MRN: [id number]  HPI Depression- ongoing issue.  At last visit we started Methylphenidate  18mg  daily in hopes of improving focus, brain fog, and mood (as she was depressed and anxious regarding her inability to focus and remember).  This was in addition to her Citalopram  and Wellbutrin .  Today pt feels she is doing much better than last visit.  Pt feels Methylphenidate  is helpful regarding motivation, concentration.   Review of Systems For ROS see HPI     Objective:   Physical Exam Vitals reviewed.  Constitutional:      General: She is not in acute distress.    Appearance: Normal appearance. She is not ill-appearing.  HENT:     Head: Normocephalic and atraumatic.  Eyes:     Extraocular Movements: Extraocular movements intact.     Conjunctiva/sclera: Conjunctivae normal.  Skin:    General: Skin is warm and dry.  Neurological:     General: No focal deficit present.     Mental Status: She is alert and oriented to person, place, and time.  Psychiatric:        Mood and Affect: Mood normal.        Behavior: Behavior normal.        Thought Content: Thought content normal.           Assessment & Plan:

## 2024-01-12 NOTE — Patient Instructions (Signed)
 Schedule your complete physical in 2 months INCREASE the Methylphenidate  to 27mg  daily CONTINUE the Citalopram  and Wellbutrin  as directed Keep up the good work!  You look great! Look into semaglutide  options and let me know Call with any questions or concerns Stay Safe!  Stay Healthy! Happy Fall!!!

## 2024-01-12 NOTE — Assessment & Plan Note (Signed)
 Improving.  Pt feels that addition of Methylphenidate  has been very helpful.  Would like to increase dose for even more improvement.  New prescription sent for 27mg  daily.  Pt expressed understanding and is in agreement w/ plan.

## 2024-02-06 ENCOUNTER — Other Ambulatory Visit: Payer: Self-pay | Admitting: Family Medicine

## 2024-02-06 DIAGNOSIS — F332 Major depressive disorder, recurrent severe without psychotic features: Secondary | ICD-10-CM

## 2024-02-06 NOTE — Telephone Encounter (Signed)
 Copied from CRM [id number]. Topic: Clinical - Medication Refill >> Feb 06, 2024  3:24 PM Turkey A wrote: Medication: methylphenidate  27 MG PO TB24  Has the patient contacted their pharmacy? Yes (Agent: If no, request that the patient contact the pharmacy for the refill. If patient does not wish to contact the pharmacy document the reason why and proceed with request.) (Agent: If yes, when and what did the pharmacy advise?) Needs to call Primary  This is the patient's preferred pharmacy:  [id number] - [place], [place] - [address] [place] [address] Phone: [phone] Fax: [phone]   Is this the correct pharmacy for this prescription? Yes If no, delete pharmacy and type the correct one.   Has the prescription been filled recently? No  Is the patient out of the medication? Yes  Has the patient been seen for an appointment in the last year OR does the patient have an upcoming appointment? Yes  Can we respond through MyChart? Yes  Agent: Please be advised that Rx refills may take up to 3 business days. We ask that you follow-up with your pharmacy.

## 2024-02-09 MED ORDER — METHYLPHENIDATE HCL ER 27 MG PO TB24: 27.0000 mg | ORAL_TABLET | Freq: Every day | ORAL | 0 refills | Status: DC

## 2024-02-09 NOTE — Telephone Encounter (Signed)
 Requested Prescriptions   Pending Prescriptions Disp Refills   methylphenidate  27 MG PO TB24 30 tablet 0    Sig: Take 1 tablet (27 mg total) by mouth daily.     Date of patient request: 02/09/2024 Last office visit: 01/12/2024 Upcoming visit: 03/15/2024 Date of last refill: 01/12/2024 Last refill amount: 30

## 2024-02-10 ENCOUNTER — Other Ambulatory Visit: Payer: Self-pay

## 2024-02-10 DIAGNOSIS — E039 Hypothyroidism, unspecified: Secondary | ICD-10-CM

## 2024-02-10 MED ORDER — LEVOTHYROXINE SODIUM 150 MCG PO TABS: 150.0000 ug | ORAL_TABLET | Freq: Every day | ORAL | 3 refills | Status: AC

## 2024-02-17 ENCOUNTER — Other Ambulatory Visit: Payer: Self-pay | Admitting: Family Medicine

## 2024-02-23 ENCOUNTER — Other Ambulatory Visit: Payer: Self-pay | Admitting: Medical Genetics

## 2024-02-23 DIAGNOSIS — Z006 Encounter for examination for normal comparison and control in clinical research program: Secondary | ICD-10-CM

## 2024-03-05 ENCOUNTER — Other Ambulatory Visit (HOSPITAL_COMMUNITY): Payer: Self-pay

## 2024-03-10 ENCOUNTER — Other Ambulatory Visit: Payer: Self-pay | Admitting: Family Medicine

## 2024-03-15 ENCOUNTER — Ambulatory Visit (INDEPENDENT_AMBULATORY_CARE_PROVIDER_SITE_OTHER): Admitting: Family Medicine

## 2024-03-15 ENCOUNTER — Encounter: Payer: Self-pay | Admitting: Family Medicine

## 2024-03-15 VITALS — BP 110/72 | HR 71 | Temp 98.2°F | Ht 62.5 in | Wt 211.0 lb

## 2024-03-15 DIAGNOSIS — E782 Mixed hyperlipidemia: Secondary | ICD-10-CM

## 2024-03-15 DIAGNOSIS — E559 Vitamin D deficiency, unspecified: Secondary | ICD-10-CM | POA: Diagnosis not present

## 2024-03-15 DIAGNOSIS — Z0000 Encounter for general adult medical examination without abnormal findings: Secondary | ICD-10-CM

## 2024-03-15 DIAGNOSIS — Z23 Encounter for immunization: Secondary | ICD-10-CM

## 2024-03-15 NOTE — Patient Instructions (Signed)
 Follow up in 6 months to recheck cholesterol We'll notify you of your lab results and make any changes if needed Continue to work on healthy diet and regular exercise- you can do it! Just let me know when you're ready to have the incontinence and hand numbness evaluated Call with any questions or concerns Stay Safe!  Stay Healthy! Happy Holidays!!!

## 2024-03-15 NOTE — Progress Notes (Signed)
   Subjective:    Patient ID: Allison Reyes, female    DOB: 03-12-1961, 63 y.o.   MRN: [id number]  HPI CPE- UTD on mammo, DEXA, cologuard, Tdap.  Due for flu.  Patient Care Team    Relationship Specialty Notifications Start End  Mahlon Comer BRAVO, MD PCP - General Family Medicine  08/30/21   Gillie Duncans, MD Consulting Physician Neurosurgery  11/25/17   Starla Harland BROCKS, MD Consulting Physician Obstetrics and Gynecology  06/02/18     Health Maintenance  Topic Date Due   Influenza Vaccine  11/28/2023   COVID-19 Vaccine (4 - 2025-26 season) 03/31/2024 (Originally 12/29/2023)   Pneumococcal Vaccine: 50+ Years (1 of 1 - PCV) 03/15/2025 (Originally 09/02/2010)   Mammogram  12/24/2024   DEXA SCAN  04/14/2025   Fecal DNA (Cologuard)  09/23/2025   DTaP/Tdap/Td (3 - Td or Tdap) 08/21/2032   Hepatitis C Screening  Completed   HIV Screening  Completed   Zoster Vaccines- Shingrix   Completed   Hepatitis B Vaccines 19-59 Average Risk  Aged Out   HPV VACCINES  Aged Out   Meningococcal B Vaccine  Aged Out      Review of Systems Patient reports no vision/ hearing changes, adenopathy,fever, weight change,  persistant/recurrent hoarseness , swallowing issues, chest pain, palpitations, edema, persistant/recurrent cough, hemoptysis, dyspnea (rest/exertional/paroxysmal nocturnal), gastrointestinal bleeding (melena, rectal bleeding), abdominal pain, significant heartburn, bowel changes, Gyn symptoms (abnormal  bleeding, pain),  syncope, focal weakness, memory loss, skin/hair/nail changes, abnormal bruising or bleeding, anxiety, or depression.   + bladder incontinence- plans to have evaluated in the near future + R hand/arm numbness- episodic, wants to wait til new year    Objective:   Physical Exam General Appearance:    Alert, cooperative, no distress, appears stated age, obese  Head:    Normocephalic, without obvious abnormality, atraumatic  Eyes:    PERRL, conjunctiva/corneas clear, EOM's intact both  eyes  Ears:    Normal TM's and external ear canals, both ears  Nose:   Nares normal, septum midline, mucosa normal, no drainage    or sinus tenderness  Throat:   Lips, mucosa, and tongue normal; teeth and gums normal  Neck:   Supple, symmetrical, trachea midline, no adenopathy;    Thyroid : no enlargement/tenderness/nodules  Back:     Symmetric, no curvature, ROM normal, no CVA tenderness  Lungs:     Clear to auscultation bilaterally, respirations unlabored  Chest Wall:    No tenderness or deformity   Heart:    Regular rate and rhythm, S1 and S2 normal, no murmur, rub   or gallop  Breast Exam:    Deferred to GYN  Abdomen:     Soft, non-tender, bowel sounds active all four quadrants,    no masses, no organomegaly  Genitalia:    Deferred to GYN  Rectal:    Extremities:   Extremities normal, atraumatic, no cyanosis or edema  Pulses:   2+ and symmetric all extremities  Skin:   Skin color, texture, turgor normal, no rashes or lesions  Lymph nodes:   Cervical, supraclavicular, and axillary nodes normal  Neurologic:   CNII-XII intact, normal strength, sensation and reflexes    throughout          Assessment & Plan:

## 2024-03-15 NOTE — Assessment & Plan Note (Signed)
 Pt's PE WNL w/ exception of BMI.  UTD on mammo, DEXA, cologuard, Tdap.  Flu shot given.  Check labs.  Anticipatory guidance provided.

## 2024-03-16 ENCOUNTER — Ambulatory Visit: Payer: Self-pay | Admitting: Family Medicine

## 2024-03-16 ENCOUNTER — Other Ambulatory Visit: Payer: Self-pay | Admitting: Family Medicine

## 2024-03-16 DIAGNOSIS — E559 Vitamin D deficiency, unspecified: Secondary | ICD-10-CM

## 2024-03-16 LAB — BASIC METABOLIC PANEL WITH GFR
BUN: 16 mg/dL (ref 6–23)
CO2: 31 meq/L (ref 19–32)
Calcium: 9.8 mg/dL (ref 8.4–10.5)
Chloride: 101 meq/L (ref 96–112)
Creatinine, Ser: 0.92 mg/dL (ref 0.40–1.20)
GFR: 66.25 mL/min (ref >60.00)
Glucose, Bld: 85 mg/dL (ref 70–99)
Potassium: 4.4 meq/L (ref 3.5–5.1)
Sodium: 139 meq/L (ref 135–145)

## 2024-03-16 LAB — HEPATIC FUNCTION PANEL
ALT: 28 U/L (ref 0–35)
AST: 18 U/L (ref 0–37)
Albumin: 4.3 g/dL (ref 3.5–5.2)
Alkaline Phosphatase: 84 U/L (ref 39–117)
Bilirubin, Direct: 0.1 mg/dL (ref 0.0–0.3)
Total Bilirubin: 0.8 mg/dL (ref 0.2–1.2)
Total Protein: 6.6 g/dL (ref 6.0–8.3)

## 2024-03-16 LAB — CBC WITH DIFFERENTIAL/PLATELET
Basophils Absolute: 0.1 K/uL (ref 0.0–0.1)
Basophils Relative: 2.7 % (ref 0.0–3.0)
Eosinophils Absolute: 0.2 K/uL (ref 0.0–0.7)
Eosinophils Relative: 5.2 % — ABNORMAL HIGH (ref 0.0–5.0)
HCT: 40 % (ref 36.0–46.0)
Hemoglobin: 13.5 g/dL (ref 12.0–15.0)
Lymphocytes Relative: 52.4 % — ABNORMAL HIGH (ref 12.0–46.0)
Lymphs Abs: 1.9 K/uL (ref 0.7–4.0)
MCHC: 33.7 g/dL (ref 30.0–36.0)
MCV: 92.8 fl (ref 78.0–100.0)
Monocytes Absolute: 0.3 K/uL (ref 0.1–1.0)
Monocytes Relative: 8.1 % (ref 3.0–12.0)
Neutro Abs: 1.2 K/uL — ABNORMAL LOW (ref 1.4–7.7)
Neutrophils Relative %: 31.6 % — ABNORMAL LOW (ref 43.0–77.0)
Platelets: 335 K/uL (ref 150.0–400.0)
RBC: 4.31 Mil/uL (ref 3.87–5.11)
RDW: 12.7 % (ref 11.5–15.5)
WBC: 3.7 K/uL — ABNORMAL LOW (ref 4.0–10.5)

## 2024-03-16 LAB — TSH: TSH: 1.72 u[IU]/mL (ref 0.35–5.50)

## 2024-03-16 LAB — LIPID PANEL
Cholesterol: 153 mg/dL (ref 0–200)
HDL: 64.8 mg/dL (ref >39.00)
LDL Cholesterol: 56 mg/dL (ref 0–99)
NonHDL: 88.41
Total CHOL/HDL Ratio: 2
Triglycerides: 160 mg/dL — ABNORMAL HIGH (ref 0.0–149.0)
VLDL: 32 mg/dL (ref 0.0–40.0)

## 2024-03-16 LAB — VITAMIN D 25 HYDROXY (VIT D DEFICIENCY, FRACTURES): VITD: 47.25 ng/mL (ref 30.00–100.00)

## 2024-03-16 NOTE — Telephone Encounter (Signed)
 Okay to refill?

## 2024-03-18 ENCOUNTER — Other Ambulatory Visit: Payer: Self-pay | Admitting: Family Medicine

## 2024-03-18 MED ORDER — METHYLPHENIDATE HCL ER 27 MG PO TB24: 27.0000 mg | ORAL_TABLET | Freq: Every day | ORAL | 0 refills | Status: DC

## 2024-03-18 NOTE — Telephone Encounter (Signed)
 Copied from CRM [id number]. Topic: Clinical - Medication Refill >> Mar 18, 2024  2:49 PM Thersia C wrote: Medication: methylphenidate  27 MG PO TB24 - would like a couple of months   Has the patient contacted their pharmacy? Yes (Agent: If no, request that the patient contact the pharmacy for the refill. If patient does not wish to contact the pharmacy document the reason why and proceed with request.) (Agent: If yes, when and what did the pharmacy advise?)  This is the patient's preferred pharmacy:  [id number] - [place], [place] - [address] [place] [address] Phone: [phone] Fax: [phone]    Is this the correct pharmacy for this prescription? Yes If no, delete pharmacy and type the correct one.   Has the prescription been filled recently? No  Is the patient out of the medication? Yes  Has the patient been seen for an appointment in the last year OR does the patient have an upcoming appointment? Yes  Can we respond through MyChart? Yes  Agent: Please be advised that Rx refills may take up to 3 business days. We ask that you follow-up with your pharmacy.

## 2024-03-18 NOTE — Telephone Encounter (Signed)
 Requested Prescriptions   Pending Prescriptions Disp Refills   methylphenidate  27 MG PO TB24 30 tablet 0    Sig: Take 1 tablet (27 mg total) by mouth daily.     Date of patient request: 03/18/2024 Last office visit: 03/15/2024 Upcoming visit: Visit date not found Date of last refill: 02/09/2024 Last refill amount:30

## 2024-03-21 ENCOUNTER — Other Ambulatory Visit: Payer: Self-pay | Admitting: Family Medicine

## 2024-03-22 ENCOUNTER — Other Ambulatory Visit: Payer: Self-pay | Admitting: Family Medicine

## 2024-03-22 DIAGNOSIS — F332 Major depressive disorder, recurrent severe without psychotic features: Secondary | ICD-10-CM

## 2024-03-24 ENCOUNTER — Other Ambulatory Visit: Payer: Self-pay | Admitting: Family Medicine

## 2024-04-12 ENCOUNTER — Other Ambulatory Visit: Payer: Self-pay | Admitting: Family Medicine

## 2024-04-12 DIAGNOSIS — F332 Major depressive disorder, recurrent severe without psychotic features: Secondary | ICD-10-CM

## 2024-04-15 ENCOUNTER — Encounter: Payer: Self-pay | Admitting: Family Medicine

## 2024-04-15 MED ORDER — METHYLPHENIDATE HCL ER 27 MG PO TB24: 27.0000 mg | ORAL_TABLET | Freq: Every day | ORAL | 0 refills | Status: DC

## 2024-04-15 NOTE — Telephone Encounter (Signed)
 Requested Prescriptions   Pending Prescriptions Disp Refills   methylphenidate  27 MG PO TB24 30 tablet 0    Sig: Take 1 tablet (27 mg total) by mouth daily.     Date of patient request: 04/15/2024 Last office visit: 03/15/2024 Upcoming visit: Visit date not found Date of last refill: 03/18/2024 Last refill amount: 30  tabs 0 refills    Patient does not have a follow up 6 months appointment from last visit on 03/15/2024. I will mychart patient to let her know of this 6 month follow up and ask if she would like to schedule

## 2024-04-23 LAB — GENECONNECT MOLECULAR SCREEN: Genetic Analysis Overall Interpretation: NEGATIVE

## 2024-05-09 ENCOUNTER — Other Ambulatory Visit: Payer: Self-pay | Admitting: Family Medicine

## 2024-05-14 ENCOUNTER — Telehealth: Payer: Self-pay

## 2024-05-14 MED ORDER — METHYLPHENIDATE HCL ER 27 MG PO TB24: 27.0000 mg | ORAL_TABLET | Freq: Every day | ORAL | 0 refills | Status: AC

## 2024-05-14 NOTE — Telephone Encounter (Signed)
 methylphenidate  27 MG PO   Copied from CRM #[id number]. Topic: Clinical - Medication Refill >> May 14, 2024 12:16 PM Leah C wrote: Medication: methylphenidate  27 MG PO TB24  Has the patient contacted their pharmacy? Advised to call clinic  (Agent: If no, request that the patient contact the pharmacy for the refill. If patient does not wish to contact the pharmacy document the reason why and proceed with request.) (Agent: If yes, when and what did the pharmacy advise?)  This is the patient's preferred pharmacy:  [id number] - [place], [place] - [address] [place] [address] Phone: [phone] Fax: [phone]   Is this the correct pharmacy for this prescription? Yes If no, delete pharmacy and type the correct one.   Has the prescription been filled recently? Yes  Is the patient out of the medication? Today and tomorrow left   Has the patient been seen for an appointment in the last year OR does the patient have an upcoming appointment? Yes  Can we respond through MyChart? Yes  Agent: Please be advised that Rx refills may take up to 3 business days. We ask that you follow-up with your pharmacy.
